# Patient Record
Sex: Female | Born: 1970 | Race: White | Hispanic: No | Smoking: Never smoker
Health system: Southern US, Community
[De-identification: ages and names within clinical notes are randomized; demographics above are authoritative.]

## PROBLEM LIST (undated history)

## (undated) DIAGNOSIS — Z872 Personal history of diseases of the skin and subcutaneous tissue: Secondary | ICD-10-CM

## (undated) DIAGNOSIS — F419 Anxiety disorder, unspecified: Secondary | ICD-10-CM

## (undated) DIAGNOSIS — F32A Depression, unspecified: Secondary | ICD-10-CM

## (undated) DIAGNOSIS — I10 Essential (primary) hypertension: Secondary | ICD-10-CM

## (undated) DIAGNOSIS — B009 Herpesviral infection, unspecified: Secondary | ICD-10-CM

## (undated) DIAGNOSIS — Z975 Presence of (intrauterine) contraceptive device: Secondary | ICD-10-CM

## (undated) DIAGNOSIS — F329 Major depressive disorder, single episode, unspecified: Secondary | ICD-10-CM

## (undated) DIAGNOSIS — E7800 Pure hypercholesterolemia, unspecified: Secondary | ICD-10-CM

## (undated) DIAGNOSIS — F429 Obsessive-compulsive disorder, unspecified: Secondary | ICD-10-CM

## (undated) DIAGNOSIS — R011 Cardiac murmur, unspecified: Secondary | ICD-10-CM

## (undated) DIAGNOSIS — Z87898 Personal history of other specified conditions: Secondary | ICD-10-CM

## (undated) HISTORY — DX: Personal history of other specified conditions: Z87.898

## (undated) HISTORY — DX: Obsessive-compulsive disorder, unspecified: F42.9

## (undated) HISTORY — DX: Presence of (intrauterine) contraceptive device: Z97.5

## (undated) HISTORY — DX: Personal history of diseases of the skin and subcutaneous tissue: Z87.2

## (undated) HISTORY — DX: Depression, unspecified: F32.A

## (undated) HISTORY — DX: Herpesviral infection, unspecified: B00.9

## (undated) HISTORY — DX: Major depressive disorder, single episode, unspecified: F32.9

---

## 2007-01-08 ENCOUNTER — Ambulatory Visit: Payer: Self-pay | Admitting: Family Medicine

## 2007-01-14 ENCOUNTER — Ambulatory Visit: Payer: Self-pay | Admitting: Family Medicine

## 2007-01-23 ENCOUNTER — Encounter: Payer: Self-pay | Admitting: Pediatrics

## 2008-01-09 ENCOUNTER — Emergency Department: Payer: Self-pay | Admitting: Emergency Medicine

## 2008-05-22 ENCOUNTER — Emergency Department: Payer: Self-pay | Admitting: Emergency Medicine

## 2008-10-15 DIAGNOSIS — Z87898 Personal history of other specified conditions: Secondary | ICD-10-CM

## 2008-10-15 HISTORY — PX: COLONOSCOPY: SHX174

## 2008-10-15 HISTORY — PX: MM DUCTOGRAM BILAT R/S: HXRAD463

## 2008-10-15 HISTORY — DX: Personal history of other specified conditions: Z87.898

## 2009-02-16 ENCOUNTER — Ambulatory Visit: Payer: Self-pay

## 2009-10-15 HISTORY — PX: BREAST SURGERY: SHX581

## 2009-10-15 HISTORY — PX: BREAST EXCISIONAL BIOPSY: SUR124

## 2010-04-05 ENCOUNTER — Ambulatory Visit: Payer: Self-pay

## 2010-04-14 HISTORY — PX: BIOPSY BREAST: PRO8

## 2010-04-24 ENCOUNTER — Ambulatory Visit: Payer: Self-pay | Admitting: General Surgery

## 2010-05-04 ENCOUNTER — Ambulatory Visit: Payer: Self-pay | Admitting: General Surgery

## 2010-10-15 HISTORY — PX: COSMETIC SURGERY: SHX468

## 2010-10-15 HISTORY — PX: LIPOSUCTION: SHX10

## 2010-10-15 HISTORY — PX: HERNIA REPAIR: SHX51

## 2011-06-14 ENCOUNTER — Ambulatory Visit: Payer: Self-pay | Admitting: Nephrology

## 2011-07-23 ENCOUNTER — Ambulatory Visit: Payer: Self-pay | Admitting: Nephrology

## 2011-09-19 ENCOUNTER — Emergency Department: Payer: Self-pay

## 2011-10-11 ENCOUNTER — Ambulatory Visit: Payer: Self-pay | Admitting: Family Medicine

## 2011-10-11 ENCOUNTER — Ambulatory Visit: Payer: Self-pay | Admitting: Neurology

## 2011-10-17 ENCOUNTER — Ambulatory Visit: Payer: Medicaid Other | Admitting: Rehabilitative and Restorative Service Providers"

## 2011-10-24 ENCOUNTER — Ambulatory Visit: Payer: Medicaid Other | Attending: Neurology | Admitting: Physical Therapy

## 2011-11-02 ENCOUNTER — Other Ambulatory Visit: Payer: Self-pay | Admitting: Neurology

## 2011-11-02 DIAGNOSIS — S139XXA Sprain of joints and ligaments of unspecified parts of neck, initial encounter: Secondary | ICD-10-CM

## 2011-11-02 DIAGNOSIS — IMO0002 Reserved for concepts with insufficient information to code with codable children: Secondary | ICD-10-CM

## 2011-11-02 DIAGNOSIS — S161XXA Strain of muscle, fascia and tendon at neck level, initial encounter: Secondary | ICD-10-CM

## 2011-11-07 ENCOUNTER — Other Ambulatory Visit: Payer: Medicaid Other

## 2011-11-09 ENCOUNTER — Other Ambulatory Visit: Payer: Medicaid Other

## 2011-11-30 ENCOUNTER — Encounter: Payer: Self-pay | Admitting: Neurology

## 2012-04-12 ENCOUNTER — Inpatient Hospital Stay: Payer: Self-pay | Admitting: Internal Medicine

## 2012-04-12 LAB — URINALYSIS, COMPLETE
Blood: NEGATIVE
Ketone: NEGATIVE
Nitrite: NEGATIVE
Ph: 6 (ref 4.5–8.0)
RBC,UR: 2 /HPF (ref 0–5)
Specific Gravity: 1.009 (ref 1.003–1.030)
Squamous Epithelial: 24
WBC UR: 9 /HPF (ref 0–5)

## 2012-04-12 LAB — COMPREHENSIVE METABOLIC PANEL
Alkaline Phosphatase: 72 U/L (ref 50–136)
Anion Gap: 9 (ref 7–16)
Calcium, Total: 8.5 mg/dL (ref 8.5–10.1)
Co2: 26 mmol/L (ref 21–32)
Creatinine: 0.9 mg/dL (ref 0.60–1.30)
SGOT(AST): 46 U/L — ABNORMAL HIGH (ref 15–37)
Sodium: 138 mmol/L (ref 136–145)

## 2012-04-12 LAB — CBC
HCT: 42.5 % (ref 35.0–47.0)
MCH: 29.5 pg (ref 26.0–34.0)
MCHC: 32.9 g/dL (ref 32.0–36.0)
Platelet: 244 10*3/uL (ref 150–440)
RBC: 4.75 10*6/uL (ref 3.80–5.20)
WBC: 13.8 10*3/uL — ABNORMAL HIGH (ref 3.6–11.0)

## 2012-04-12 LAB — PROTIME-INR
INR: 1.4
Prothrombin Time: 17.8 secs — ABNORMAL HIGH (ref 11.5–14.7)

## 2012-04-13 LAB — CBC WITH DIFFERENTIAL/PLATELET
Basophil %: 0.7 %
HCT: 36.1 % (ref 35.0–47.0)
HGB: 12.4 g/dL (ref 12.0–16.0)
Lymphocyte #: 2.2 10*3/uL (ref 1.0–3.6)
MCHC: 34.4 g/dL (ref 32.0–36.0)
Monocyte #: 0.7 x10 3/mm (ref 0.2–0.9)
Neutrophil #: 5.4 10*3/uL (ref 1.4–6.5)
Platelet: 212 10*3/uL (ref 150–440)
RBC: 4.05 10*6/uL (ref 3.80–5.20)
RDW: 13.6 % (ref 11.5–14.5)

## 2012-04-13 LAB — BASIC METABOLIC PANEL
BUN: 7 mg/dL (ref 7–18)
Calcium, Total: 7.7 mg/dL — ABNORMAL LOW (ref 8.5–10.1)
Co2: 22 mmol/L (ref 21–32)
Creatinine: 0.74 mg/dL (ref 0.60–1.30)
EGFR (African American): >60
EGFR (Non-African Amer.): >60
Glucose: 99 mg/dL (ref 65–99)
Potassium: 3.3 mmol/L — ABNORMAL LOW (ref 3.5–5.1)
Sodium: 143 mmol/L (ref 136–145)

## 2012-11-13 ENCOUNTER — Ambulatory Visit: Payer: Self-pay | Admitting: Family Medicine

## 2013-03-21 ENCOUNTER — Inpatient Hospital Stay: Payer: Self-pay | Admitting: Student

## 2013-03-21 LAB — COMPREHENSIVE METABOLIC PANEL
Albumin: 3.4 g/dL (ref 3.4–5.0)
Alkaline Phosphatase: 63 U/L (ref 50–136)
BUN: 7 mg/dL (ref 7–18)
Calcium, Total: 8.2 mg/dL — ABNORMAL LOW (ref 8.5–10.1)
Co2: 24 mmol/L (ref 21–32)
EGFR (Non-African Amer.): >60
Glucose: 141 mg/dL — ABNORMAL HIGH (ref 65–99)
Total Protein: 7.1 g/dL (ref 6.4–8.2)

## 2013-03-21 LAB — CBC
HCT: 39 % (ref 35.0–47.0)
HGB: 13.3 g/dL (ref 12.0–16.0)
MCH: 30.1 pg (ref 26.0–34.0)
MCV: 89 fL (ref 80–100)
Platelet: 208 10*3/uL (ref 150–440)
RBC: 4.41 10*6/uL (ref 3.80–5.20)
RDW: 13.3 % (ref 11.5–14.5)

## 2013-03-22 LAB — BASIC METABOLIC PANEL
Anion Gap: 8 (ref 7–16)
Calcium, Total: 7.6 mg/dL — ABNORMAL LOW (ref 8.5–10.1)
Co2: 20 mmol/L — ABNORMAL LOW (ref 21–32)
Osmolality: 266 (ref 275–301)
Potassium: 4.2 mmol/L (ref 3.5–5.1)
Sodium: 134 mmol/L — ABNORMAL LOW (ref 136–145)

## 2013-03-22 LAB — CBC WITH DIFFERENTIAL/PLATELET
Basophil #: 0 10*3/uL (ref 0.0–0.1)
Eosinophil #: 0 10*3/uL (ref 0.0–0.7)
HCT: 39.1 % (ref 35.0–47.0)
HGB: 13.1 g/dL (ref 12.0–16.0)
Lymphocyte #: 0.5 10*3/uL — ABNORMAL LOW (ref 1.0–3.6)
MCH: 30 pg (ref 26.0–34.0)
MCHC: 33.5 g/dL (ref 32.0–36.0)
MCV: 90 fL (ref 80–100)
Monocyte #: 0.3 x10 3/mm (ref 0.2–0.9)
Monocyte %: 2.2 %
Neutrophil #: 13.8 10*3/uL — ABNORMAL HIGH (ref 1.4–6.5)
Neutrophil %: 94.5 %
Platelet: 169 10*3/uL (ref 150–440)
RBC: 4.37 10*6/uL (ref 3.80–5.20)
RDW: 13.4 % (ref 11.5–14.5)
WBC: 14.6 10*3/uL — ABNORMAL HIGH (ref 3.6–11.0)

## 2013-03-23 LAB — CBC WITH DIFFERENTIAL/PLATELET
Basophil %: 1.9 %
Eosinophil #: 0 10*3/uL (ref 0.0–0.7)
Eosinophil %: 0 %
HCT: 40.2 % (ref 35.0–47.0)
HGB: 13.3 g/dL (ref 12.0–16.0)
Lymphocyte #: 1.3 10*3/uL (ref 1.0–3.6)
MCH: 29.6 pg (ref 26.0–34.0)
MCHC: 33.1 g/dL (ref 32.0–36.0)
Monocyte #: 0.5 x10 3/mm (ref 0.2–0.9)
Monocyte %: 4.2 %
Neutrophil #: 10.3 10*3/uL — ABNORMAL HIGH (ref 1.4–6.5)
WBC: 12.3 10*3/uL — ABNORMAL HIGH (ref 3.6–11.0)

## 2013-03-23 LAB — BASIC METABOLIC PANEL
Anion Gap: 8 (ref 7–16)
Calcium, Total: 7.2 mg/dL — ABNORMAL LOW (ref 8.5–10.1)
EGFR (Non-African Amer.): >60
Glucose: 82 mg/dL (ref 65–99)

## 2013-03-24 LAB — CBC WITH DIFFERENTIAL/PLATELET
Basophil #: 0.1 10*3/uL (ref 0.0–0.1)
Eosinophil #: 0.2 10*3/uL (ref 0.0–0.7)
HCT: 35.4 % (ref 35.0–47.0)
HGB: 12.2 g/dL (ref 12.0–16.0)
Lymphocyte #: 1 10*3/uL (ref 1.0–3.6)
Lymphocyte %: 10.2 %
MCHC: 34.4 g/dL (ref 32.0–36.0)
MCV: 87 fL (ref 80–100)
Neutrophil #: 8.3 10*3/uL — ABNORMAL HIGH (ref 1.4–6.5)
Neutrophil %: 82.2 %
RBC: 4.06 10*6/uL (ref 3.80–5.20)
RDW: 13.7 % (ref 11.5–14.5)
WBC: 10.1 10*3/uL (ref 3.6–11.0)

## 2013-03-27 LAB — CULTURE, BLOOD (SINGLE)

## 2014-06-18 ENCOUNTER — Emergency Department: Payer: Self-pay | Admitting: Emergency Medicine

## 2014-06-18 LAB — CBC WITH DIFFERENTIAL/PLATELET
Basophil #: 0.1 10*3/uL (ref 0.0–0.1)
Basophil %: 0.7 %
Eosinophil #: 0.2 10*3/uL (ref 0.0–0.7)
Eosinophil %: 2.8 %
HCT: 45.3 % (ref 35.0–47.0)
HGB: 14.9 g/dL (ref 12.0–16.0)
LYMPHS PCT: 15.5 %
Lymphocyte #: 1.1 10*3/uL (ref 1.0–3.6)
MCH: 30.7 pg (ref 26.0–34.0)
MCHC: 33 g/dL (ref 32.0–36.0)
MCV: 93 fL (ref 80–100)
MONO ABS: 0.7 x10 3/mm (ref 0.2–0.9)
MONOS PCT: 9.2 %
Neutrophil #: 5.1 10*3/uL (ref 1.4–6.5)
Neutrophil %: 71.8 %
PLATELETS: 246 10*3/uL (ref 150–440)
RBC: 4.87 10*6/uL (ref 3.80–5.20)
RDW: 12.9 % (ref 11.5–14.5)
WBC: 7.1 10*3/uL (ref 3.6–11.0)

## 2014-06-18 LAB — BASIC METABOLIC PANEL
ANION GAP: 7 (ref 7–16)
BUN: 6 mg/dL — AB (ref 7–18)
CALCIUM: 8.9 mg/dL (ref 8.5–10.1)
CHLORIDE: 103 mmol/L (ref 98–107)
CO2: 26 mmol/L (ref 21–32)
Creatinine: 1.16 mg/dL (ref 0.60–1.30)
EGFR (African American): >60
EGFR (Non-African Amer.): 58 — ABNORMAL LOW
Glucose: 111 mg/dL — ABNORMAL HIGH (ref 65–99)
Osmolality: 270 (ref 275–301)
Potassium: 3.9 mmol/L (ref 3.5–5.1)
Sodium: 136 mmol/L (ref 136–145)

## 2014-06-18 LAB — MONONUCLEOSIS SCREEN: Mono Test: NEGATIVE

## 2014-06-30 ENCOUNTER — Ambulatory Visit: Payer: Self-pay | Admitting: Family Medicine

## 2014-09-05 ENCOUNTER — Emergency Department: Payer: Self-pay | Admitting: Internal Medicine

## 2015-02-04 NOTE — H&P (Signed)
PATIENT NAME:  Victoria Shepard, Victoria Shepard MR#:  [id number] DATE OF BIRTH:  09-Oct-1971  DATE OF ADMISSION:  03/21/2013  REFERRING PHYSICIAN: Dr. Reita Cliche.  PRIMARY CARE PHYSICIAN: Dr. Jeananne Rama.   CHIEF COMPLAINT: Abdominal pain, redness and fevers.   HISTORY OF PRESENT ILLNESS: The patient is a pleasant, 44 year old, obese, Caucasian female with a history of liposuction, tummy tuck and cellulitis last year, anxiety, hypertension and depression, who presents to the hospital after experiencing the above symptoms. The patient states that she has been having lipotropic injections 2 days a week, 2 injections in her upper abdomen. The dose was recently increased and she was also given a rubber belt to apply. These are all for her presumed obesity, which is being treated at [facility]. After the lipotropic injection yesterday, she applied the rubber band for the first time. She started to have redness in her belly with pain and then developed fevers. She stated that she felt hot yesterday, but did not check her temp, but today it was 101.5. She came in to the hospital. She was found to have tachycardia, leukocytosis, redness in cellulitis and the abdomen, and hospitalist services were contacted for further evaluation and management.   PAST MEDICAL HISTORY: History of anxiety, depression, abdominal liposuction, hypertension, history of heart murmur, history of hemorrhoids and a history of abdominal cellulitis/contact dermatitis possibly last year.   SOCIAL HISTORY: No Tobacco, alcohol, or other drug use.   OUTPATIENT MEDICATIONS: Atenolol 25 mg daily, Celexa 40 mg daily, Klonopin 1 mg 2 times a day as needed for nervousness, nortriptyline 30 mg once a day at bedtime and phentermine 37.5 mg once a day.   ALLERGIES: AMOXICILLIN, ERYTHROMYCIN AND PENICILLIN.   FAMILY HISTORY: Hypertension and diabetes. Father with MI.   REVIEW OF SYSTEMS: CONSTITUTIONAL: Positive for fever, fatigue and global weakness.  EYES: No blurry  vision or double vision. ENT: No tinnitus or hearing loss. No dysphagia.  CARDIOVASCULAR: No chest pain, swelling in the legs or orthopnea. He has high blood pressure. PULMONARY: Denies any a cough or shortness of breath, wheezing or COPD. GASTROINTESTINAL: Nausea without vomiting. Positive for abdominal wall redness and soreness. No melena, bloody stools or dark stools. GENITOURINARY: Denies dysuria or hematuria. HEMATOLOGIC AND LYMPHATIC: No anemia or easy bruising. SKIN: Rash as above, otherwise no other rashes. MUSCULOSKELETAL: Has aches since yesterday. NEUROLOGIC: Denies focal weakness or numbness. No stroke or seizures. PSYCHIATRIC: Has anxiety and depression.   PHYSICAL EXAMINATION:  VITAL SIGNS: Temperature was 99.4, pulse rate 110, respiratory rate 20, blood pressure 121/68, oxygen saturation 97% on room air.  GENERAL: The patient is an obese, Caucasian female sitting in bed in no obvious distress.  HEENT: Normocephalic, atraumatic. Pupils are equal and reactive. Anicteric sclerae. Extraocular muscles intact. Moist mucous membranes.  NECK: Supple. No thyroid tenderness. No cervical lymphadenopathy.  CARDIOVASCULAR: S1, S2, tachycardic. No murmurs, rubs, or gallops.  LUNGS: Clear to auscultation. No wheezing, rhonchi or rales.  ABDOMEN: Soft and distended. There is a band-like, below umbilical, area of redness, tenderness and heat that tracks laterally on flanks. There is no drainage.  EXTREMITIES: No significant lower extremity edema.  SKIN: No obvious rashes other than stated above.  NEUROLOGIC: Cranial nerves II through XII grossly intact. Strength is 5 out of 5 in all extremities. Sensation intact to light touch.  PSYCHIATRIC: Awake, alert, oriented x 3. Cooperative.   LABS: Glucose 141, BUN 7, creatinine 1.07, sodium 136, potassium 3.3. LFTs within normal limits. WBC is 18.2, hemoglobin 13.3, platelets  208.   ASSESSMENT AND PLAN: We have a 44 year old female with history of  liposuction and tummy tuck, hypertension, anxiety, depression, who is getting treated at [facility] with "lipotropic" injections, 4 injections weekly, who also started to use a rubber belt, who comes in with sepsis. The patient has never used rubber belts before she states. She has tachycardia, leukocytosis and evidence for abdominal wall cellulitis, which oddly enough is in a band-like format with possible exacerbation from the rubber band. There is also possible contact dermatitis from the rubber band. However, the patient has significant leukocytosis as well. We will admit the patient to the hospital. Would start the patient on clindamycin as she can tolerate this and she did take this last year with improvement. Would start her on IV fluids. Blood cultures have been stent. She was started on Tylenol. We would also start her on some Benadryl for itching. Follow with the cultures and hopefully the rash would resolve. The patient does appear to have a small skin tear on the lateral side of the umbilicus, but this appears not to be the source. The patient has also been having injections of lipotropic material in her abdomen and she showed me where she injected the material; however, I do not see any redness, cellulitis or drainage at those sites which are higher than the affected cellulitic portion of the abdomen, which makes me think that it is possibly not the etiology and perhaps the rubber band irritated the skin causing a super infection over the irritated area. She does have some hypokalemia and this will be repleted. I will start her on some heparin for deep vein thrombosis prophylaxis. Resume her blood pressure and anxiety  medications.   TOTAL TIME SPENT: 55 minutes.   CODE STATUS: The patient is full code.   ____________________________ Vivien Presto, MD sa:aw D: 03/21/2013 12:51:46 ET T: 03/21/2013 13:22:23 ET JOB#: [id number]  cc: Vivien Presto, MD, <Dictator> Guadalupe Maple, MD Vivien Presto MD ELECTRONICALLY SIGNED 03/21/2013 14:27

## 2015-02-04 NOTE — Discharge Summary (Signed)
PATIENT NAME:  Victoria Shepard, FEI MR#:  [id number] DATE OF BIRTH:  [birth year]  DATE OF ADMISSION:  03/21/2013 DATE OF DISCHARGE:  03/24/2013  PRIMARY CARE PHYSICIAN: Guadalupe Maple, MD  CHIEF COMPLAINT: Abdominal pain, redness, fevers.   DISCHARGE DIAGNOSES:  1. Sepsis from abdominal wall cellulitis.  2. Transient hypotension.  3. History of hypertension.  4. Anxiety.  5. Depression.  6. Hypokalemia.  7. History of abdominal liposuction.  8. History of hemorrhoids.  9. History of abdominal cellulitis and contact dermatitis last year.   DISCHARGE MEDICATIONS:  1. Celexa 40 mg daily. 2. Klonopin 1 mg 2 times a day as needed for anxiety or nervousness.  3. Nortriptyline 30 mg daily.  4. Phentermine 37.5 mg daily.  5. Clindamycin 300 mg 1 cap every 6 hours.  6. Zofran ODT 4 mg 3 times a day as needed for nausea and vomiting.   SIGNIFICANT LABORATORY AND IMAGING DATA: Initial WBC was 18.1; discharge day WBC was 10.1. Initial hemoglobin 13.3. LFTs on arrival within normal limits. Initial potassium 3.3, creatinine 1.07, BUN 7, creatinine 1.07. Blood cultures have been no growth to date from June 7. A lactic acid was 1.8 on June 8.   HISTORY OF PRESENT ILLNESS AND HOSPITAL COURSE: For full details of H and P, please see the dictation on June 7 by Dr. Bridgette Habermann, but briefly, this is a pleasant 44 year old obese female with a history of liposuction, tummy tack and cellulitis last year of abdominal wall, who presents with the above chief complaint. The patient had been having lipotropic injections 2 days a week in her upper abdomen prescribed by her [facility], and started to wear a rubber band around her stomach. On the day of admission, started to sweat profusely, started to have a temperature, redness and evidence for infection with leukocytosis. She was admitted to the hospitalist service, started on clindamycin. She had leukocytosis and tachycardia on arrival, possibly from sepsis from abdominal wall.  Blood cultures were obtained, which have been negative to date. The patient did become transiently hypotensive, requiring multiple boluses of IV fluids, and her atenolol was held. She was transferred to CCU transiently; however, did not go into shock, nor did she require pressors. The patient was started on daptomycin IV to cover for broad-spectrum, including MRSA; however, did well and was discharged with clindamycin for total treatment days of 10 days. Her sepsis appears to have resolved. There is still erythema and tenderness on the abdominal wall, but that is stable and not worsening. At this point, she has no fever and will be discharged with outpatient followup with Dr. Golden Pop and instructed to no longer have any injections. Furthermore, the patient likely has a contact dermatitis element from the rubber band and was instructed to not use that belt.   TOTAL TIME SPENT: 35 minutes.  CODE STATUS: The patient is full code.   ____________________________ Vivien Presto, MD sa:OSi D: 03/25/2013 08:39:34 ET T: 03/25/2013 09:15:47 ET JOB#: [id number]  cc: Vivien Presto, MD, <Dictator> Guadalupe Maple, MD Vivien Presto MD ELECTRONICALLY SIGNED 04/12/2013 22:10

## 2015-02-06 NOTE — Discharge Summary (Signed)
PATIENT NAME:  Victoria Shepard, Victoria Shepard MR#:  [id number] DATE OF BIRTH:  16-Aug-1971  DATE OF ADMISSION:  04/12/2012 DATE OF DISCHARGE:  04/13/2012  PRESENTING COMPLAINT: Rash over the lower abdomen.  DISCHARGE DIAGNOSES: 1. Abdominal wall pruritic rash suspected irritation from tanning spray.  2. Small open surgical wound, chronic over the umbilicus.   CONDITION ON DISCHARGE: Fair.   MEDICATIONS:  1. Celexa 40 mg p.o. daily.  2. Klonopin 1 mg 1 tablet twice a day as needed.  3. Atenolol 25 mg daily.  4. Nortriptyline 10 mg tablet 30 mg daily at bedtime.  5. Clindamycin 300 mg p.o. t.i.d. for six days.  6. Benadryl 25 mg p.o. b.i.d. p.r.n. over-the-counter for itching.   FOLLOW UP: Follow up with Dr. Jeananne Rama in 1 to 2 weeks.   LABORATORY, DIAGNOSTIC AND RADIOLOGICAL DATA: Blood cultures negative. White count 8.6. Rest of the comprehensive metabolic panel within normal limits except potassium of 3.3. PT-INR 17.8 and 1.4. Urinalysis negative for urinary tract infection. Wound aerobic culture done from the umbilical area shows heavy growth of staph aureus. Blood culture negative in 48 hours. White count 13.8 on admission. ESR 13.    HISTORY OF PRESENT ILLNESS: Victoria Shepard is a 44 year old Caucasian female who came into the Emergency Room after she started noticing itchy rash in the lower abdomen. She was admitted with:  1. Lower abdominal rash, initially thought to be cellulitis, however, it appeared to be more of a rash which was likely due to use from tanning spray with skin irritation. She had been using the tanning spray and continued with some sinus congestion along with this itchy rash and headache. Blood cultures remained negative. White count was normal. She was started on IV clindamycin. Did not seem to be any evidence of true cellulitis on examination. Patient was advised not to use the spray. She will finish up a course of clindamycin.   2. Small skin ulcer near the umbilicus from previous  surgical scar. She is on empiric clindamycin. Her wound culture grew staph aureus. She has had staph infection from the same ulcer in the past. Patient did not appear clinically toxic.  3. Anxiety. She was continued on her home medications.  4. Patient was clinically stable at discharge to home. Follow up with her primary care physician, Dr. Jeananne Rama, in 1 to 2 weeks.   TIME SPENT: 40 minutes.   ____________________________ Hart Rochester Posey Pronto, MD sap:cms D: 04/14/2012 14:13:22 ET T: 04/15/2012 11:06:02 ET JOB#: [id number]  cc: Neamiah Sciarra A. Posey Pronto, MD, <Dictator> Guadalupe Maple, MD Ilda Basset MD ELECTRONICALLY SIGNED 04/17/2012 14:10

## 2015-02-06 NOTE — H&P (Signed)
PATIENT NAME:  Victoria Shepard, CIPRIANI MR#:  [id number] DATE OF BIRTH:  1971/08/01  DATE OF ADMISSION:  04/12/2012  PRIMARY CARE PHYSICIAN: Golden Pop, MD  REFERRING PHYSICIAN: Ferman Hamming, MD  CHIEF COMPLAINT: Abdominal erythema, sore, and fever for two days.  HISTORY OF PRESENT ILLNESS: The patient is a 44 year old Caucasian female with a history of hypertension, anxiety, and abdominal liposuction surgery in 2010 and Staphylococcus infection status post surgery who presented to the emergency department with abdominal redness, sore, and fever for the past two days. The patient's highest temperature was 102 yesterday. In addition, she had a headache, nausea, generalized body soreness, and palpitations. She denies any chest pain or diaphoresis, no leg swelling, orthopnea or nocturnal dyspnea, no melena or bloody stool, no diarrhea, no dysuria, and no hematuria.  She was treated with vancomycin in the emergency department and started to feel itchy.   SOCIAL HISTORY: Denies any smoking, alcohol or illicit drugs.   FAMILY HISTORY: Hypertension and diabetes. Father had a heart attack.   PAST SURGICAL HISTORY: Abdominal liposuction surgery in 2010.  REVIEW OF SYSTEMS: CONSTITUTIONAL: The patient has fever and chills and headache, but no dizziness, weakness. HEENT: No double vision or blurred vision and no epistaxis or postnasal drip. No dysphagia.  CARDIOVASCULAR: No chest pain, leg swelling, orthopnea, or nocturnal dyspnea but has palpitations.  PULMONARY: No cough, sputum, shortness of breath, or hemoptysis. GASTROINTESTINAL: Positive for nausea and abdominal soreness. No vomiting or diarrhea, no melena or bloody stool. GENITOURINARY: No dysuria, hematuria or incontinence. SKIN: No jaundice but has redness in the abdominal area. NEURO: No syncope, loss of consciousness or seizures. PSYCH: Has anxiety but no depression.      PHYSICAL EXAMINATION:  VITALS: Temperature 99.8, blood pressure 126/95,  pulse 145, respirations 20, and oxygen saturation 98% in room air.  PHYSICAL EXAMINATION: Alert, awake, and oriented; in no acute distress.  HEENT: Pupils are round, equal and reactive to light and accommodation. Moist oral mucosa. Clear oropharynx.   NECK: Supple. No JVD or carotid bruits. No lymphadenopathy or thyromegaly.  HEART: S1 and S2 regular rate and rhythm. No murmurs, rubs, or gallops.   LUNGS: Bilateral air entry. No wheezing or rales. No use of accessory muscles to breathe.  ABDOMEN: Obese, soft. Bowel sounds present. No obvious organomegaly. No tenderness. No distention. There is erythema around lower part of abdomen.  There is a surgical scar on both sides of the lower abdomen with erythema and mild tenderness but no obvious abscess. Has warmness.   EXTREMITIES: No edema, clubbing or cyanosis. No calf tenderness.   NEURO: Alert and oriented x3. No focal deficits. Power 5/5. Sensation intact. DTRs 2+.  LABORATORY, DIAGNOSTIC AND RADIOLOGIC DATA: Urinalysis: WBC 9, RBC 2, nitrite negative.   Glucose 111, BUN 7, creatinine 0.9, sodium 138, potassium 3.3, chloride 103, and bicarbonate 26. WBC 13.8, hemoglobin 14, and platelets 244. ESR 13.   EKG: Sinus tachy at 133 beats per minute.   IMPRESSION:  1. Abdominal cellulitis. 2. SIRS.  3. Tachycardia. 4. Hypokalemia.  5. Hypertension, controlled. 6. Anxiety.  PLAN OF TREATMENT: The patient will be admitted to the medical floor. Due to allergic reaction, we will start clindamycin 300 mg IV every eight hours. Followup CBC and we will give IV fluid support. For hypokalemia, we will give Klor-Con and followup BMP and magnesium level. For hypertension, we will continue Lopressor. For anxiety, we will continue Celexa and Klonopin p.r.n. GI and DVT prophylaxis.   I discussed the patient's situation  with the patient and the patient's daughter.  TIME SPENT: Approximately 55 minutes. ____________________________ Demetrios Loll,  MD qc:slb D: 04/12/2012 13:02:18 ET T: 04/12/2012 13:32:52 ET JOB#: [id number]  cc: Demetrios Loll, MD, <Dictator> Guadalupe Maple, MD Demetrios Loll MD ELECTRONICALLY SIGNED 04/13/2012 15:35

## 2015-02-24 ENCOUNTER — Emergency Department
Admission: EM | Admit: 2015-02-24 | Discharge: 2015-02-24 | Disposition: A | Discharge status: Home / Self Care | Payer: Medicaid Other | Attending: Emergency Medicine | Admitting: Emergency Medicine

## 2015-02-24 ENCOUNTER — Encounter: Payer: Self-pay | Admitting: *Deleted

## 2015-02-24 DIAGNOSIS — K0889 Other specified disorders of teeth and supporting structures: Secondary | ICD-10-CM

## 2015-02-24 DIAGNOSIS — Z880 Allergy status to penicillin: Secondary | ICD-10-CM | POA: Diagnosis not present

## 2015-02-24 DIAGNOSIS — K088 Other specified disorders of teeth and supporting structures: Secondary | ICD-10-CM | POA: Diagnosis not present

## 2015-02-24 HISTORY — DX: Pure hypercholesterolemia, unspecified: E78.00

## 2015-02-24 HISTORY — DX: Anxiety disorder, unspecified: F41.9

## 2015-02-24 HISTORY — DX: Cardiac murmur, unspecified: R01.1

## 2015-02-24 HISTORY — DX: Essential (primary) hypertension: I10

## 2015-02-24 MED ORDER — TRAMADOL HCL 50 MG PO TABS: 50.0000 mg | ORAL_TABLET | Freq: Four times a day (QID) | ORAL | Status: DC | PRN

## 2015-02-24 MED ORDER — IBUPROFEN 50 MG PO CHEW: 50.0000 mg | CHEWABLE_TABLET | Freq: Three times a day (TID) | ORAL | Status: DC | PRN

## 2015-02-24 MED ORDER — CEPHALEXIN 500 MG PO CAPS: 500.0000 mg | ORAL_CAPSULE | Freq: Four times a day (QID) | ORAL | Status: AC

## 2015-02-24 NOTE — ED Notes (Signed)
Left ear pain , left jaw pain cavity noted , lost filling ( unknown time she lost it ) sensitive

## 2015-02-24 NOTE — ED Notes (Signed)
Pt complains of left ear pain for the last two days

## 2015-02-24 NOTE — Discharge Instructions (Signed)
Dental Pain A tooth ache may be caused by cavities (tooth decay). Cavities expose the nerve of the tooth to air and hot or cold temperatures. It may come from an infection or abscess (also called a boil or furuncle) around your tooth. It is also often caused by dental caries (tooth decay). This causes the pain you are having. DIAGNOSIS  Your caregiver can diagnose this problem by exam. TREATMENT   If caused by an infection, it may be treated with medications which kill germs (antibiotics) and pain medications as prescribed by your caregiver. Take medications as directed.  Only take over-the-counter or prescription medicines for pain, discomfort, or fever as directed by your caregiver.  Whether the tooth ache today is caused by infection or dental disease, you should see your dentist as soon as possible for further care. SEEK MEDICAL CARE IF: The exam and treatment you received today has been provided on an emergency basis only. This is not a substitute for complete medical or dental care. If your problem worsens or new problems (symptoms) appear, and you are unable to meet with your dentist, call or return to this location. SEEK IMMEDIATE MEDICAL CARE IF:   You have a fever.  You develop redness and swelling of your face, jaw, or neck.  You are unable to open your mouth.  You have severe pain uncontrolled by pain medicine. MAKE SURE YOU:   Understand these instructions.  Will watch your condition.  Will get help right away if you are not doing well or get worse. Document Released: 10/01/2005 Document Revised: 12/24/2011 Document Reviewed: 05/19/2008 [facility] Patient Information 2015 [place], [place]. This information is not intended to replace advice given to you by your health care provider. Make sure you discuss any questions you have with your health care provider. OPTIONS FOR DENTAL FOLLOW UP CARE  [place] [facility] and [place] [email or web address] [phone])  [facility] [phone])  [place] [phone])  [place] [phone])  [facility] [phone]) This clinic caters to the indigent population and is on a lottery system. Location: [employer] [facility], [place], [place], [place] Clinic Hours: Wednesdays from 6pm - 9pm, patients seen by a lottery system. For dates, call or go to [email or web address] Services: Cleanings, fillings and simple extractions. Payment Options: DENTAL WORK IS FREE OF CHARGE. Bring proof of income or support. Best way to get seen: Arrive at 5:15 pm - this is a lottery, NOT first come/first serve, so arriving earlier will not increase your chances of being seen.     [facility] [phone] Select option 1 for emergencies   Location: [facility], [place], [address] [place] Clinic Hours: No walk-ins accepted - call the day before to schedule an appointment. Check in times are 9:30 am and 1:30 pm. Services: Simple extractions, temporary fillings, pulpectomy/pulp debridement, uncomplicated abscess drainage. Payment Options: PAYMENT IS DUE AT THE TIME OF SERVICE.  Fee is usually $100-200, additional surgical procedures (e.g. abscess drainage) may be extra. Cash, checks, Visa/MasterCard accepted.  Can file Medicaid if patient is covered for dental - patient should call case worker to check. No discount for [facility] patients. Best way to get seen: MUST call the day before and get onto the schedule. Can usually be seen the next 1-2 days. No walk-ins accepted.     [facility] [phone]   Location: [place], [place] Clinic Hours: M, W, Th, F 8am or 1:30pm, Tues  9a or 1:30 - first come/first served. Services: Simple extractions, temporary fillings, uncomplicated abscess drainage.  You do not need to be an [facility] resident. Payment Options: PAYMENT IS DUE AT THE TIME OF SERVICE. Dental insurance, otherwise sliding scale - bring proof of income or support. Depending on income and treatment needed, cost is usually $50-200. Best way to get seen: Arrive early as it is first come/first served.     [facility] [phone]   Location: [place] [facility] Hours: Mon-Thu 8a-5p Services: Most basic dental services including extractions and fillings. Payment Options: PAYMENT IS DUE AT THE TIME OF SERVICE. Sliding scale, up to 50% off - bring proof if income or support. Medicaid with dental option accepted. Best way to get seen: Call to schedule an appointment, can usually be seen within 2 weeks OR they will try to see walk-ins - show up at [place] or 2p (you may have to wait).     [facility] [place] RESIDENTS ONLY   Location: [facility], [place] [address], [place], [place] [address] Clinic Hours: By appointment only. Monday - Thursday 8am-5pm, Friday 8am-12pm Services: Cleanings, fillings, extractions. Payment Options: PAYMENT IS DUE AT THE TIME OF SERVICE. Cash, Visa or MasterCard. Sliding scale - $30 minimum per service. Best way to get seen: Come in to office, complete packet and make an appointment - need proof of income or support monies for each household member and proof of [facility] residence. Usually takes about a month to get in.     [facility] [phone]   Location: [address] [place] Clinic Hours: Walk-in Urgent Care Dental Services are offered Monday-Friday mornings only. The numbers of emergencies accepted daily is limited to the number of providers available. Maximum 15 -  Mondays, Wednesdays & Thursdays Maximum 10 - Tuesdays & Fridays Services: You do not need to be a [facility] resident to be seen for a dental emergency. Emergencies are defined as pain, swelling, abnormal bleeding, or dental trauma. Walkins will receive x-rays if needed. NOTE: Dental cleaning is not an emergency. Payment Options: PAYMENT IS DUE AT THE TIME OF SERVICE. Minimum co-pay is $40.00 for uninsured patients. Minimum co-pay is $3.00 for Medicaid with dental coverage. Dental Insurance is accepted and must be presented at time of visit. Medicare does not cover dental. Forms of payment: Cash, credit card, checks. Best way to get seen: If not previously registered with the clinic, walk-in dental registration begins at 7:15 am and is on a first come/first serve basis. If previously registered with the clinic, call to make an appointment.     [facility] [place] ONLY   Location: [address] [place], [place] Clinic Hours: Mon-Thu 10a-2p Services: Extractions only! Payment Options: FREE (donations accepted) - bring proof of income or support Best way to get seen: Call and schedule an appointment OR come at 8am on the 1st Monday of every month (except for holidays) when it is first come/first served.     [facility] [phone]   Location: [place], [place] Clinic Hours: Friday mornings Services, Payment Options, Best way to get seen: Call for info

## 2015-02-24 NOTE — ED Provider Notes (Signed)
[facility] Emergency Department Provider Note  ____________________________________________  Time seen: Approximately 4:52 PM  I have reviewed the triage vital signs and the nursing notes.   HISTORY  Chief Complaint Otalgia    HPI Victoria Shepard is a 44 y.o. female complaining of dental pain radiating to the left ear. Patient is secondary to a filling is partially broken off from molar. Patient stated intermittent bleeding from the gums. Patient states she is able to eat with only mild discomfort. She denies any fever or chills. She rates the pain as 8/10. Patient said unable to see a dentist secondary to lack of insurance.   No past medical history on file.  There are no active problems to display for this patient.   No past surgical history on file.  No current outpatient prescriptions on file.  Allergies Penicillins  No family history on file.  Social History History  Substance Use Topics  . Smoking status: Never Smoker   . Smokeless tobacco: Not on file  . Alcohol Use: No    Review of Systems Constitutional: No fever/chills Eyes: No visual changes. ENT: No sore throat. Cardiovascular: Denies chest pain. Respiratory: Denies shortness of breath. Gastrointestinal: No abdominal pain.  No nausea, no vomiting.  No diarrhea.  No constipation. Genitourinary: Negative for dysuria. Musculoskeletal: Negative for back pain. Skin: Negative for rash. Neurological: Negative for headaches, focal weakness or numbness.  10-point ROS otherwise negative.  ____________________________________________   PHYSICAL EXAM:  VITAL SIGNS: ED Triage Vitals  Enc Vitals Group     BP 02/24/15 1552 132/78 mmHg     Pulse Rate 02/24/15 1552 111     Resp --      Temp 02/24/15 1552 98.4 F (36.9 C)     Temp Source 02/24/15 1552 Oral     SpO2 02/24/15 1552 99 %     Weight 02/24/15 1552 168 lb (76.204 kg)     Height 02/24/15 1552 5\' 3"  (1.6 m)   Head Cir --      Peak Flow --      Pain Score 02/24/15 1553 8     Pain Loc --      Pain Edu? --      Excl. in [place]? --     Constitutional: Alert and oriented. Well appearing and in no acute distress. Eyes: Conjunctivae are normal. PERRL. EOMI. Head: Atraumatic. Nose: No congestion/rhinnorhea. Mouth/Throat: Mucous membranes are moist.  Oropharynx non-erythematous. States swollen around the gum on the left lower molar Neck: No stridor.   Hematological/Lymphatic/Immunilogical: No cervical lymphadenopathy. Cardiovascular: Normal rate, regular rhythm. Grossly normal heart sounds.  Good peripheral circulation. Respiratory: Normal respiratory effort.  No retractions. Lungs CTAB. Gastrointestinal: Soft and nontender. No distention. No abdominal bruits. No CVA tenderness. Musculoskeletal: No lower extremity tenderness nor edema.  No joint effusions. Neurologic:  Normal speech and language. No gross focal neurologic deficits are appreciated. Speech is normal. No gait instability. Skin:  Skin is warm, dry and intact. No rash noted. Psychiatric: Mood and affect are normal. Speech and behavior are normal.  ____________________________________________   LABS (all labs ordered are listed, but only abnormal results are displayed)  Labs Reviewed - No data to display ____________________________________________  EKG   ____________________________________________  RADIOLOGY  ___________________________________________   PROCEDURES  Procedure(s) performed: None  Critical Care performed: No  ____________________________________________   INITIAL IMPRESSION / ASSESSMENT AND PLAN / ED COURSE  Pertinent labs & imaging results that were available during my care of the patient were  reviewed by me and considered in my medical decision making (see chart for details).  Dental pain ____________________________________________   FINAL CLINICAL IMPRESSION(S) / ED DIAGNOSES  Final  diagnoses:  Pain, dental    OPTIONS FOR DENTAL FOLLOW UP CARE  [place] [facility] and [place] [email or web address] [phone])  [facility] [phone])  [facility] [phone])  [place] [phone])  [facility] [phone]) This clinic caters to the indigent population and is on a lottery system. Location: [employer] [facility], [place], [place], [place] [facility] Hours: Wednesdays from 6pm - 9pm, patients seen by a lottery system. For dates, call or go to [email or web address] Services: Cleanings, fillings and simple extractions. Payment Options: DENTAL WORK IS FREE OF CHARGE. Bring proof of income or support. Best way to get seen: Arrive at 5:15 pm - this is a lottery, NOT first come/first serve, so arriving earlier will not increase your chances of being seen.     [facility] [phone] Select option 1 for emergencies   Location: [facility], [place], [address] [place] Clinic Hours: No walk-ins accepted - call the day before to schedule an appointment. Check in times are 9:30 am and 1:30 pm. Services: Simple extractions, temporary fillings, pulpectomy/pulp debridement, uncomplicated abscess drainage. Payment Options: PAYMENT IS DUE AT THE TIME OF SERVICE.  Fee is usually $100-200, additional surgical procedures (e.g. abscess drainage) may be extra. Cash, checks, Visa/MasterCard accepted.  Can file Medicaid if patient is covered for dental - patient should call case worker to check. No discount for [facility] patients. Best way to get seen: MUST call the day before and get onto the schedule. Can usually be seen the next 1-2 days. No walk-ins accepted.     [place] [phone]   Location: [place], [place] Clinic Hours: M, W, Th, F 8am or 1:30pm, Tues 9a or 1:30 - first come/first served. Services: Simple extractions, temporary fillings, uncomplicated abscess drainage.  You do not need to be an [facility] resident. Payment Options: PAYMENT IS DUE AT THE TIME OF SERVICE. Dental insurance, otherwise sliding scale - bring proof of income or support. Depending on income and treatment needed, cost is usually $50-200. Best way to get seen: Arrive early as it is first come/first served.     [facility] [phone]   Location: [place] [facility] Hours: Mon-Thu 8a-5p Services: Most basic dental services including extractions and fillings. Payment Options: PAYMENT IS DUE AT THE TIME OF SERVICE. Sliding scale, up to 50% off - bring proof if income or support. Medicaid with dental option accepted. Best way to get seen: Call to schedule an appointment, can usually be seen within 2 weeks OR they will try to see walk-ins - show up at [place] or 2p (you may have to wait).     [facility] RESIDENTS ONLY   Location: [facility], [place] [address] [place], [place] [address] Clinic Hours: By appointment only. Monday - Thursday 8am-5pm, Friday 8am-12pm Services: Cleanings, fillings, extractions. Payment Options: PAYMENT IS DUE AT THE TIME OF SERVICE. Cash, Visa or MasterCard. Sliding scale - $30 minimum per service. Best way to get seen: Come in to office, complete packet and make an appointment - need proof of income or  support monies for each household member and proof of [facility] residence. Usually takes about a month to get in.     [facility] [phone]   Location: [address] [place] Clinic Hours: Walk-in Urgent Care Dental Services are  offered Monday-Friday mornings only. The numbers of emergencies accepted daily is limited to the number of providers available. Maximum 15 - Mondays, Wednesdays & Thursdays Maximum 10 - Tuesdays & Fridays Services: You do not need to be a [facility] resident to be seen for a dental emergency. Emergencies are defined as pain, swelling, abnormal bleeding, or dental trauma. Walkins will receive x-rays if needed. NOTE: Dental cleaning is not an emergency. Payment Options: PAYMENT IS DUE AT THE TIME OF SERVICE. Minimum co-pay is $40.00 for uninsured patients. Minimum co-pay is $3.00 for Medicaid with dental coverage. Dental Insurance is accepted and must be presented at time of visit. Medicare does not cover dental. Forms of payment: Cash, credit card, checks. Best way to get seen: If not previously registered with the clinic, walk-in dental registration begins at 7:15 am and is on a first come/first serve basis. If previously registered with the clinic, call to make an appointment.     [facility] [place] ONLY   Location: [address] [place], [place] Clinic Hours: Mon-Thu 10a-2p Services: Extractions only! Payment Options: FREE (donations accepted) - bring proof of income or support Best way to get seen: Call and schedule an appointment OR come at 8am on the 1st Monday of every month (except for holidays) when it is first come/first served.     [facility] [phone]   Location: [place], [place] Clinic Hours: Friday mornings Services, Payment Options, Best way to get seen: Call for [place], PA-C 02/24/15 [place], PA-C 02/24/15 [place], MD 02/24/15 [phone]

## 2015-03-16 ENCOUNTER — Telehealth: Payer: Self-pay

## 2015-03-16 ENCOUNTER — Other Ambulatory Visit: Payer: Self-pay | Admitting: Family Medicine

## 2015-03-16 NOTE — Telephone Encounter (Signed)
Pt called stated she was seen at [facility] for tooth pain, has an appt to go to the dental clinic later next month. Was prescribed pain meds. The medications she was given are gone. Pt wants to know if [facility] can call in pain and anti inflamatory meds can be called in for her. [facility] [place]. Pt stated her back left tooth broke and her filling came out. -

## 2015-03-16 NOTE — Telephone Encounter (Signed)
call

## 2015-03-16 NOTE — Telephone Encounter (Signed)
Medication was refilled and faxed to pharmacy 03/15/15.

## 2015-04-07 ENCOUNTER — Encounter: Payer: Self-pay | Admitting: Emergency Medicine

## 2015-04-07 ENCOUNTER — Emergency Department
Admission: EM | Admit: 2015-04-07 | Discharge: 2015-04-07 | Disposition: A | Discharge status: Home / Self Care | Payer: Medicaid Other | Attending: Emergency Medicine | Admitting: Emergency Medicine

## 2015-04-07 DIAGNOSIS — Z79899 Other long term (current) drug therapy: Secondary | ICD-10-CM | POA: Insufficient documentation

## 2015-04-07 DIAGNOSIS — I10 Essential (primary) hypertension: Secondary | ICD-10-CM | POA: Insufficient documentation

## 2015-04-07 DIAGNOSIS — Z880 Allergy status to penicillin: Secondary | ICD-10-CM | POA: Diagnosis not present

## 2015-04-07 DIAGNOSIS — B3731 Acute candidiasis of vulva and vagina: Secondary | ICD-10-CM

## 2015-04-07 DIAGNOSIS — B373 Candidiasis of vulva and vagina: Secondary | ICD-10-CM | POA: Insufficient documentation

## 2015-04-07 DIAGNOSIS — L293 Anogenital pruritus, unspecified: Secondary | ICD-10-CM | POA: Diagnosis present

## 2015-04-07 LAB — URINALYSIS COMPLETE WITH MICROSCOPIC (ARMC ONLY)
Bacteria, UA: NONE SEEN
Bilirubin Urine: NEGATIVE
Glucose, UA: NEGATIVE mg/dL
HGB URINE DIPSTICK: NEGATIVE
Ketones, ur: NEGATIVE mg/dL
NITRITE: NEGATIVE
PH: 5 (ref 5.0–8.0)
PROTEIN: NEGATIVE mg/dL
SPECIFIC GRAVITY, URINE: 1.021 (ref 1.005–1.030)

## 2015-04-07 LAB — CHLAMYDIA/NGC RT PCR (ARMC ONLY)
Chlamydia Tr: NOT DETECTED
N gonorrhoeae: NOT DETECTED

## 2015-04-07 LAB — WET PREP, GENITAL
Clue Cells Wet Prep HPF POC: NONE SEEN
Trich, Wet Prep: NONE SEEN
Yeast Wet Prep HPF POC: NONE SEEN

## 2015-04-07 MED ORDER — FLUCONAZOLE 100 MG PO TABS
ORAL_TABLET | ORAL | Status: AC
  Filled 2015-04-07: qty 1

## 2015-04-07 MED ORDER — KETOROLAC TROMETHAMINE 10 MG PO TABS
10.0000 mg | ORAL_TABLET | Freq: Four times a day (QID) | ORAL | Status: DC | PRN
Start: 2015-04-07 — End: 2015-04-29

## 2015-04-07 MED ORDER — FLUCONAZOLE 50 MG PO TABS
150.0000 mg | ORAL_TABLET | Freq: Once | ORAL | Status: AC
  Administered 2015-04-07: 150 mg via ORAL

## 2015-04-07 MED ORDER — KETOROLAC TROMETHAMINE 10 MG PO TABS
10.0000 mg | ORAL_TABLET | Freq: Once | ORAL | Status: AC
  Administered 2015-04-07: 10 mg via ORAL

## 2015-04-07 MED ORDER — KETOROLAC TROMETHAMINE 10 MG PO TABS
ORAL_TABLET | ORAL | Status: AC
  Administered 2015-04-07: 10 mg via ORAL
  Filled 2015-04-07: qty 1

## 2015-04-07 MED ORDER — FLUCONAZOLE 50 MG PO TABS
ORAL_TABLET | ORAL | Status: AC
  Filled 2015-04-07: qty 1

## 2015-04-07 MED ORDER — HYDROXYZINE PAMOATE 25 MG PO CAPS: 25.0000 mg | ORAL_CAPSULE | Freq: Three times a day (TID) | ORAL | Status: DC | PRN

## 2015-04-07 NOTE — ED Notes (Signed)
Reports vaginal itching since taking keflex last month

## 2015-04-07 NOTE — Discharge Instructions (Signed)

## 2015-04-07 NOTE — ED Notes (Signed)
Pt states she was diagnosed with herpes last year, pt states vagina is red, inflamed and burning, pt states she used some monostat last night, pt has been on keflex for a tooth infection, pt states she is using a new body wash and takes baths

## 2015-04-07 NOTE — ED Provider Notes (Signed)
[facility] Emergency Department Provider Note  ____________________________________________  Time seen: 0905  I have reviewed the triage vital signs and the nursing notes.   HISTORY  Chief Complaint Vaginal Itching    HPI Victoria Shepard is a 44 y.o. female scribes burning and discharge and inflammation of her vaginal area states that she was using Keflex off and on for tooth infection states that she also has been diagnosed with herpes in the past not because she's had lesions because she was positive and her blood she is concerned that she may have a breakout she overall rates it as a burning 6 out of 10 pain nothing making it particularly better or worse and she is arrived here today for further evaluation treatment no other complaints at this time   Past Medical History  Diagnosis Date  . Heart murmur   . Hypertension   . Hypercholesteremia   . Anxiety     There are no active problems to display for this patient.   Past Surgical History  Procedure Laterality Date  . Liposuction    . Breast surgery    . Cosmetic surgery      Current Outpatient Rx  Name  Route  Sig  Dispense  Refill  . atorvastatin (LIPITOR) 20 MG tablet   Oral   Take 20 mg by mouth daily.         . clonazePAM (KLONOPIN) 1 MG tablet   Oral   Take 1 mg by mouth 2 (two) times daily as needed for anxiety.         . hydrOXYzine (VISTARIL) 25 MG capsule   Oral   Take 1 capsule (25 mg total) by mouth 3 (three) times daily as needed for itching.   15 capsule   0   . ibuprofen (ADVIL,MOTRIN) 50 MG chewable tablet   Oral   Chew 1 tablet (50 mg total) by mouth every 8 (eight) hours as needed for fever.   24 tablet   2   . ibuprofen (ADVIL,MOTRIN) 800 MG tablet      TAKE 1 TABLET 3 TIMES A DAY FOR 5 DAYS   60 tablet   1   . ketorolac (TORADOL) 10 MG tablet   Oral   Take 1 tablet (10 mg total) by mouth every 6 (six) hours as needed.   20 tablet   0   .  traMADol (ULTRAM) 50 MG tablet      TAKE 1 TABLET EVERY 4 TO 6 HOURS   12 tablet   0     Not to exceed 5 additional fills before 08/23/2015 ...   . valACYclovir (VALTREX) 500 MG tablet   Oral   Take 1,000 mg by mouth daily.           Allergies Penicillins  History reviewed. No pertinent family history.  Social History History  Substance Use Topics  . Smoking status: Never Smoker   . Smokeless tobacco: Not on file  . Alcohol Use: No    Review of Systems  Constitutional: No fever/chills Eyes: No visual changes. ENT: No sore throat. Cardiovascular: Denies chest pain. Respiratory: Denies shortness of breath. Gastrointestinal: Nausea Genitourinary: Negative for dysuria. Vaginal discharge white Musculoskeletal: Negative for back pain. Skin: Negative for rash. Neurological: Negative for headaches, focal weakness or numbness.  10-point ROS otherwise negative.  ____________________________________________   PHYSICAL EXAM:  VITAL SIGNS: ED Triage Vitals  Enc Vitals Group     BP 04/07/15 0742 134/93 mmHg  Pulse Rate 04/07/15 0742 99     Resp 04/07/15 0742 18     Temp 04/07/15 0742 98.2 F (36.8 C)     Temp Source 04/07/15 0742 Oral     SpO2 04/07/15 0742 99 %     Weight 04/07/15 0742 172 lb (78.019 kg)     Height 04/07/15 0742 5\' 3"  (1.6 m)     Head Cir --      Peak Flow --      Pain Score 04/07/15 0739 8     Pain Loc --      Pain Edu? --      Excl. in [place]? --     Constitutional: Alert and oriented. Well appearing and in no acute distress. Eyes: Conjunctivae are normal. PERRL. EOMI. Head: Atraumatic. Nose: No congestion/rhinnorhea. Mouth/Throat: Mucous membranes are moist.  Oropharynx non-erythematous. Neck: No stridor.   Cardiovascular: Normal rate, regular rhythm. Grossly normal heart sounds.  Good peripheral circulation. Respiratory: Normal respiratory effort.  No retractions. Lungs CTAB. Gastrointestinal: Soft and nontender. No distention. No  abdominal bruits. No CVA tenderness. Genitourinary: Red and inflamed vaginal tissue with a white discharge *Musculoskeletal: No lower extremity tenderness nor edema.  No joint effusions. Neurologic:  Normal speech and language. No gross focal neurologic deficits are appreciated. Speech is normal. No gait instability. Skin:  Skin is warm, dry and intact. No rash noted. Psychiatric: Mood and affect are normal. Speech and behavior are normal.  ____________________________________________   LABS (all labs ordered are listed, but only abnormal results are displayed)  Labs Reviewed  WET PREP, GENITAL - Abnormal; Notable for the following:    WBC, Wet Prep HPF POC FEW (*)    All other components within normal limits  URINALYSIS COMPLETEWITH MICROSCOPIC ([facility] ONLY) - Abnormal; Notable for the following:    Color, Urine YELLOW (*)    APPearance HAZY (*)    Leukocytes, UA 2+ (*)    Squamous Epithelial / LPF 6-30 (*)    All other components within normal limits  CHLAMYDIA/NGC RT PCR ([facility] ONLY)      PROCEDURES  Procedure(s) performed: None  Critical Care performed: No  ____________________________________________   INITIAL IMPRESSION / ASSESSMENT AND PLAN / ED COURSE  Pertinent labs & imaging results that were available during my care of the patient were reviewed by me and considered in my medical decision making (see chart for details).  Initial impression yeast vaginitis given that the patient did use high-dose Monistat the day before I don't know how accurate the wet prep was given a B will go and treat her with a Diflucan orally started on Toradol and Atarax for symptoms have her follow-up with the health department or or her own doctor as needed for any further concerns ____________________________________________   FINAL CLINICAL IMPRESSION(S) / ED DIAGNOSES  Final diagnoses:  Yeast vaginitis     Deunte Bledsoe Verdene Rio, PA-C 04/07/15 1147  Earleen Newport, MD 04/07/15 1426

## 2015-04-14 ENCOUNTER — Telehealth: Payer: Self-pay

## 2015-04-14 MED ORDER — FLUCONAZOLE 150 MG PO TABS: 150.0000 mg | ORAL_TABLET | Freq: Once | ORAL | Status: DC

## 2015-04-14 NOTE — Telephone Encounter (Signed)
Patient was seen in ED 04/07/15 for yeast infection, pelvic done Was given Diflucan Pill She is better but still slight itch and discharge, wondering if you could write her another Diflucan or an Rx for Monistat (so Medicaid will pay) [facility]

## 2015-04-17 DIAGNOSIS — E876 Hypokalemia: Secondary | ICD-10-CM | POA: Diagnosis not present

## 2015-04-17 DIAGNOSIS — M791 Myalgia: Secondary | ICD-10-CM | POA: Diagnosis not present

## 2015-04-17 DIAGNOSIS — Z79899 Other long term (current) drug therapy: Secondary | ICD-10-CM | POA: Diagnosis not present

## 2015-04-17 DIAGNOSIS — I10 Essential (primary) hypertension: Secondary | ICD-10-CM | POA: Diagnosis not present

## 2015-04-17 DIAGNOSIS — Z880 Allergy status to penicillin: Secondary | ICD-10-CM | POA: Diagnosis not present

## 2015-04-17 DIAGNOSIS — N760 Acute vaginitis: Secondary | ICD-10-CM | POA: Insufficient documentation

## 2015-04-17 DIAGNOSIS — J029 Acute pharyngitis, unspecified: Secondary | ICD-10-CM | POA: Insufficient documentation

## 2015-04-17 DIAGNOSIS — R509 Fever, unspecified: Secondary | ICD-10-CM | POA: Diagnosis present

## 2015-04-17 NOTE — ED Notes (Signed)
Patient reports sore throat, fever and generalized body aches.

## 2015-04-18 ENCOUNTER — Emergency Department
Admission: EM | Admit: 2015-04-18 | Discharge: 2015-04-18 | Disposition: A | Discharge status: Home / Self Care | Payer: Medicaid Other | Attending: Emergency Medicine | Admitting: Emergency Medicine

## 2015-04-18 ENCOUNTER — Telehealth: Payer: Self-pay | Admitting: Emergency Medicine

## 2015-04-18 DIAGNOSIS — N760 Acute vaginitis: Secondary | ICD-10-CM

## 2015-04-18 DIAGNOSIS — E876 Hypokalemia: Secondary | ICD-10-CM

## 2015-04-18 DIAGNOSIS — M791 Myalgia, unspecified site: Secondary | ICD-10-CM

## 2015-04-18 DIAGNOSIS — B9689 Other specified bacterial agents as the cause of diseases classified elsewhere: Secondary | ICD-10-CM

## 2015-04-18 LAB — CBC
HCT: 44.3 % (ref 35.0–47.0)
Hemoglobin: 15 g/dL (ref 12.0–16.0)
MCH: 30.5 pg (ref 26.0–34.0)
MCHC: 34 g/dL (ref 32.0–36.0)
MCV: 89.7 fL (ref 80.0–100.0)
PLATELETS: 319 10*3/uL (ref 150–440)
RBC: 4.93 MIL/uL (ref 3.80–5.20)
RDW: 13 % (ref 11.5–14.5)
WBC: 9.1 10*3/uL (ref 3.6–11.0)

## 2015-04-18 LAB — CK: CK TOTAL: 65 U/L (ref 38–234)

## 2015-04-18 LAB — URINALYSIS COMPLETE WITH MICROSCOPIC (ARMC ONLY)
BILIRUBIN URINE: NEGATIVE
Glucose, UA: NEGATIVE mg/dL
Hgb urine dipstick: NEGATIVE
Ketones, ur: NEGATIVE mg/dL
Nitrite: NEGATIVE
PH: 6 (ref 5.0–8.0)
Protein, ur: NEGATIVE mg/dL
SPECIFIC GRAVITY, URINE: 1.016 (ref 1.005–1.030)

## 2015-04-18 LAB — BASIC METABOLIC PANEL
Anion gap: 12 (ref 5–15)
BUN: 10 mg/dL (ref 6–20)
CALCIUM: 9 mg/dL (ref 8.9–10.3)
CHLORIDE: 99 mmol/L — AB (ref 101–111)
CO2: 27 mmol/L (ref 22–32)
CREATININE: 0.92 mg/dL (ref 0.44–1.00)
GFR calc Af Amer: >60 mL/min (ref >60)
GFR calc non Af Amer: >60 mL/min (ref >60)
Glucose, Bld: 116 mg/dL — ABNORMAL HIGH (ref 65–99)
POTASSIUM: 2.8 mmol/L — AB (ref 3.5–5.1)
SODIUM: 138 mmol/L (ref 135–145)

## 2015-04-18 LAB — CHLAMYDIA/NGC RT PCR (ARMC ONLY)
CHLAMYDIA TR: NOT DETECTED
N gonorrhoeae: NOT DETECTED

## 2015-04-18 LAB — WET PREP, GENITAL
TRICH WET PREP: NONE SEEN
YEAST WET PREP: NONE SEEN

## 2015-04-18 MED ORDER — POTASSIUM CHLORIDE 20 MEQ PO PACK: 40.0000 meq | PACK | Freq: Once | ORAL | Status: DC

## 2015-04-18 MED ORDER — POTASSIUM CHLORIDE CRYS ER 20 MEQ PO TBCR
40.0000 meq | EXTENDED_RELEASE_TABLET | Freq: Once | ORAL | Status: AC
  Administered 2015-04-18: 40 meq via ORAL

## 2015-04-18 MED ORDER — METRONIDAZOLE 500 MG PO TABS
ORAL_TABLET | ORAL | Status: AC
  Administered 2015-04-18: 500 mg via ORAL
  Filled 2015-04-18: qty 1

## 2015-04-18 MED ORDER — POTASSIUM CHLORIDE ER 10 MEQ PO TBCR: 20.0000 meq | EXTENDED_RELEASE_TABLET | Freq: Every day | ORAL | Status: DC

## 2015-04-18 MED ORDER — METRONIDAZOLE 500 MG PO TABS
500.0000 mg | ORAL_TABLET | Freq: Once | ORAL | Status: AC
  Administered 2015-04-18: 500 mg via ORAL

## 2015-04-18 MED ORDER — POTASSIUM CHLORIDE CRYS ER 20 MEQ PO TBCR
EXTENDED_RELEASE_TABLET | ORAL | Status: AC
  Administered 2015-04-18: 40 meq via ORAL
  Filled 2015-04-18: qty 2

## 2015-04-18 MED ORDER — METRONIDAZOLE 500 MG PO TABS: 500.0000 mg | ORAL_TABLET | Freq: Two times a day (BID) | ORAL | Status: DC

## 2015-04-18 MED ORDER — SODIUM CHLORIDE 0.9 % IV BOLUS (SEPSIS)
1000.0000 mL | Freq: Once | INTRAVENOUS | Status: AC
  Administered 2015-04-18: 1000 mL via INTRAVENOUS

## 2015-04-18 NOTE — ED Provider Notes (Signed)
[facility] Emergency Department Provider Note  ____________________________________________  Time seen: Approximately 1:31 AM  I have reviewed the triage vital signs and the nursing notes.   HISTORY  Chief Complaint Fever; Generalized Body Aches; and Sore Throat    HPI Victoria Shepard is a 44 y.o. female who was here on 6/23 with inflammation, burning, vaginal itching and discharge. She was told that she had yeast vaginitis and given Diflucan for her symptoms. She reports that she continued to have the discharge itching and burning so she called her doctor and is unable to get an appointment until 8/8. She reports that she was called in another prescription for Diflucan which did help the itching and burning. She reports though that she does still have some discharge with a slight smell. She reports though that since today she's been feeling achy and nauseated. She has had a low-grade temperature from 99.2 200 day. She reports that she's also been feeling clammy. She feels some drainage going down her throat and has some mild facial pain. The patient reports that she is been taking ibuprofen 800 mg and Toradol which has not been helping the body aches go away. She reports that she has been hospitalized for sepsis in the past and is concerned that what ever vaginal infection she has may be spreading to her bloodstream. The patient reports that she did not have blood work when she was seen previously and has been feeling bloated with abdominal pain. She reports her pain is 8 out of 10 in intensity.   Past Medical History  Diagnosis Date  . Heart murmur   . Hypertension   . Hypercholesteremia   . Anxiety     There are no active problems to display for this patient.   Past Surgical History  Procedure Laterality Date  . Liposuction    . Breast surgery    . Cosmetic surgery      Current Outpatient Rx  Name  Route  Sig  Dispense  Refill  . atorvastatin  (LIPITOR) 20 MG tablet   Oral   Take 20 mg by mouth daily.         . clonazePAM (KLONOPIN) 1 MG tablet   Oral   Take 1 mg by mouth 2 (two) times daily as needed for anxiety.         . fluconazole (DIFLUCAN) 150 MG tablet   Oral   Take 1 tablet (150 mg total) by mouth once.   1 tablet   1   . hydrOXYzine (VISTARIL) 25 MG capsule   Oral   Take 1 capsule (25 mg total) by mouth 3 (three) times daily as needed for itching.   15 capsule   0   . ibuprofen (ADVIL,MOTRIN) 50 MG chewable tablet   Oral   Chew 1 tablet (50 mg total) by mouth every 8 (eight) hours as needed for fever.   24 tablet   2   . ibuprofen (ADVIL,MOTRIN) 800 MG tablet      TAKE 1 TABLET 3 TIMES A DAY FOR 5 DAYS   60 tablet   1   . ketorolac (TORADOL) 10 MG tablet   Oral   Take 1 tablet (10 mg total) by mouth every 6 (six) hours as needed.   20 tablet   0   . metroNIDAZOLE (FLAGYL) 500 MG tablet   Oral   Take 1 tablet (500 mg total) by mouth 2 (two) times daily.   14 tablet  0   . potassium chloride (K-DUR) 10 MEQ tablet   Oral   Take 2 tablets (20 mEq total) by mouth daily.   8 tablet   0   . traMADol (ULTRAM) 50 MG tablet      TAKE 1 TABLET EVERY 4 TO 6 HOURS   12 tablet   0     Not to exceed 5 additional fills before 08/23/2015 ...   . valACYclovir (VALTREX) 500 MG tablet   Oral   Take 1,000 mg by mouth daily.           Allergies Penicillins; Erythromycin; and Keflex  No family history on file.  Social History History  Substance Use Topics  . Smoking status: Never Smoker   . Smokeless tobacco: Not on file  . Alcohol Use: No    Review of Systems Constitutional: No fever/chills Eyes: No visual changes. ENT:  sore throat. Cardiovascular: Denies chest pain. Respiratory: Denies shortness of breath. Gastrointestinal: Nausea with No abdominal pain.  no vomiting.  No diarrhea.  No constipation. Genitourinary: Vaginal discharge but Negative for dysuria. Musculoskeletal:  Negative for back pain. Skin: Negative for rash. Neurological: Negative for headaches, focal weakness or numbness.  10-point ROS otherwise negative.  ____________________________________________   PHYSICAL EXAM:  VITAL SIGNS: ED Triage Vitals  Enc Vitals Group     BP 04/17/15 2120 124/99 mmHg     Pulse Rate 04/17/15 2120 110     Resp 04/17/15 2120 20     Temp 04/17/15 2120 98.6 F (37 C)     Temp Source 04/17/15 2120 Oral     SpO2 04/17/15 2120 98 %     Weight 04/17/15 2120 172 lb (78.019 kg)     Height 04/17/15 2120 5\' 3"  (1.6 m)     Head Cir --      Peak Flow --      Pain Score 04/17/15 2121 8     Pain Loc --      Pain Edu? --      Excl. in [place]? --     Constitutional: Alert and oriented. Well appearing and in no acute distress. Eyes: Conjunctivae are normal. PERRL. EOMI. Head: Atraumatic. Nose: No congestion/rhinnorhea. Mouth/Throat: Mucous membranes are moist.  Oropharynx non-erythematous. Neck: no palpable, cervical lymphadenopathy Cardiovascular: Tachycardia with regular rhythm. Grossly normal heart sounds.  Good peripheral circulation. Respiratory: Normal respiratory effort.  No retractions. Lungs CTAB. Gastrointestinal: Soft and nontender. No distention. Positive bowel sounds Genitourinary: Normal external genitalia with mild vaginal discharge no pain with exam no cervical motion tenderness, bimanual exam negative Musculoskeletal: No lower extremity tenderness nor edema.  No joint effusions. Neurologic:  Normal speech and language.  Skin:  Skin is warm, dry and intact. No rash noted. Psychiatric: Mood and affect are normal.   ____________________________________________   LABS (all labs ordered are listed, but only abnormal results are displayed)  Labs Reviewed  WET PREP, GENITAL - Abnormal; Notable for the following:    Clue Cells Wet Prep HPF POC MODERATE (*)    WBC, Wet Prep HPF POC MODERATE (*)    All other components within normal limits  BASIC  METABOLIC PANEL - Abnormal; Notable for the following:    Potassium 2.8 (*)    Chloride 99 (*)    Glucose, Bld 116 (*)    All other components within normal limits  URINALYSIS COMPLETEWITH MICROSCOPIC ([facility] ONLY) - Abnormal; Notable for the following:    Color, Urine YELLOW (*)    APPearance HAZY (*)  Leukocytes, UA 1+ (*)    Bacteria, UA RARE (*)    Squamous Epithelial / LPF 6-30 (*)    All other components within normal limits  CHLAMYDIA/NGC RT PCR ([facility] ONLY)  CBC  CK   ____________________________________________  EKG  None ____________________________________________  RADIOLOGY  None ____________________________________________   PROCEDURES  Procedure(s) performed: None  Critical Care performed: No  ____________________________________________   INITIAL IMPRESSION / ASSESSMENT AND PLAN / ED COURSE  Pertinent labs & imaging results that were available during my care of the patient were reviewed by me and considered in my medical decision making (see chart for details).  This is a 44 year old female who comes in today with body aches, vaginal discharge and some sore throat. The patient has taken Diflucan 2 doses and is concerned about possible STDs and spread of infection. The patient's blood work is unremarkable but she does have bacterial vaginosis on exam as well as low potassium. The patient does not have low magnesium or elevated white blood cell count. The patient's blood work is also unremarkable. I will give the patient a dose of potassium as well as metronidazole and I will discharge the patient to home. I explained to the patient that she does need to follow-up with her primary care physician if the symptoms tend to persist. ____________________________________________   FINAL CLINICAL IMPRESSION(S) / ED DIAGNOSES  Final diagnoses:  Bacterial vaginosis  Myalgia  Hypokalemia      Loney Hering, MD 04/18/15 0501

## 2015-04-18 NOTE — ED Notes (Signed)
MD notified of pts potassium 2.8

## 2015-04-18 NOTE — Discharge Instructions (Signed)
Bacterial Vaginosis Bacterial vaginosis is a vaginal infection that occurs when the normal balance of bacteria in the vagina is disrupted. It results from an overgrowth of certain bacteria. This is the most common vaginal infection in women of childbearing age. Treatment is important to prevent complications, especially in pregnant women, as it can cause a premature delivery. CAUSES  Bacterial vaginosis is caused by an increase in harmful bacteria that are normally present in smaller amounts in the vagina. Several different kinds of bacteria can cause bacterial vaginosis. However, the reason that the condition develops is not fully understood. RISK FACTORS Certain activities or behaviors can put you at an increased risk of developing bacterial vaginosis, including:  Having a new sex partner or multiple sex partners.  Douching.  Using an intrauterine device (IUD) for contraception. Women do not get bacterial vaginosis from toilet seats, bedding, swimming pools, or contact with objects around them. SIGNS AND SYMPTOMS  Some women with bacterial vaginosis have no signs or symptoms. Common symptoms include:  Grey vaginal discharge.  A fishlike odor with discharge, especially after sexual intercourse.  Itching or burning of the vagina and vulva.  Burning or pain with urination. DIAGNOSIS  Your health care provider will take a medical history and examine the vagina for signs of bacterial vaginosis. A sample of vaginal fluid may be taken. Your health care provider will look at this sample under a microscope to check for bacteria and abnormal cells. A vaginal pH test may also be done.  TREATMENT  Bacterial vaginosis may be treated with antibiotic medicines. These may be given in the form of a pill or a vaginal cream. A second round of antibiotics may be prescribed if the condition comes back after treatment.  HOME CARE INSTRUCTIONS   Only take over-the-counter or prescription medicines as  directed by your health care provider.  If antibiotic medicine was prescribed, take it as directed. Make sure you finish it even if you start to feel better.  Do not have sex until treatment is completed.  Tell all sexual partners that you have a vaginal infection. They should see their health care provider and be treated if they have problems, such as a mild rash or itching.  Practice safe sex by using condoms and only having one sex partner. SEEK MEDICAL CARE IF:   Your symptoms are not improving after 3 days of treatment.  You have increased discharge or pain.  You have a fever. MAKE SURE YOU:   Understand these instructions.  Will watch your condition.  Will get help right away if you are not doing well or get worse. FOR MORE INFORMATION  Centers for Disease Control and Prevention, Division of STD Prevention: [employer]raiser[employer]raud[employer] American Sexual Health Association (ASHA): www.ashastd.org  Document Released: 10/01/2005 Document Revised: 07/22/2013 Document Reviewed: 05/13/2013 [facility] Patient Information 2015 [place], [place]. This information is not intended to replace advice given to you by your health care provider. Make sure you discuss any questions you have with your health care provider.  Hypokalemia Hypokalemia means that the amount of potassium in the blood is lower than normal.Potassium is a chemical, called an electrolyte, that helps regulate the amount of fluid in the body. It also stimulates muscle contraction and helps nerves function properly.Most of the body's potassium is inside of cells, and only a very small amount is in the blood. Because the amount in the blood is so small, minor changes can be life-threatening. CAUSES  Antibiotics.  Diarrhea or vomiting.  Using laxatives too  much, which can cause diarrhea.  Chronic kidney disease.  Water pills (diuretics).  Eating disorders (bulimia).  Low magnesium level.  Sweating a lot. SIGNS AND  SYMPTOMS  Weakness.  Constipation.  Fatigue.  Muscle cramps.  Mental confusion.  Skipped heartbeats or irregular heartbeat (palpitations).  Tingling or numbness. DIAGNOSIS  Your health care provider can diagnose hypokalemia with blood tests. In addition to checking your potassium level, your health care provider may also check other lab tests. TREATMENT Hypokalemia can be treated with potassium supplements taken by mouth or adjustments in your current medicines. If your potassium level is very low, you may need to get potassium through a vein (IV) and be monitored in the hospital. A diet high in potassium is also helpful. Foods high in potassium are:  Nuts, such as peanuts and pistachios.  Seeds, such as sunflower seeds and pumpkin seeds.  Peas, lentils, and lima beans.  Whole grain and bran cereals and breads.  Fresh fruit and vegetables, such as apricots, avocado, bananas, cantaloupe, kiwi, oranges, tomatoes, asparagus, and potatoes.  Orange and tomato juices.  Red meats.  Fruit yogurt. HOME CARE INSTRUCTIONS  Take all medicines as prescribed by your health care provider.  Maintain a healthy diet by including nutritious food, such as fruits, vegetables, nuts, whole grains, and lean meats.  If you are taking a laxative, be sure to follow the directions on the label. SEEK MEDICAL CARE IF:  Your weakness gets worse.  You feel your heart pounding or racing.  You are vomiting or having diarrhea.  You are diabetic and having trouble keeping your blood glucose in the normal range. SEEK IMMEDIATE MEDICAL CARE IF:  You have chest pain, shortness of breath, or dizziness.  You are vomiting or having diarrhea for more than 2 days.  You faint. MAKE SURE YOU:   Understand these instructions.  Will watch your condition.  Will get help right away if you are not doing well or get worse. Document Released: 10/01/2005 Document Revised: 07/22/2013 Document Reviewed:  04/03/2013 [facility] Patient Information 2015 [place], [place]. This information is not intended to replace advice given to you by your health care provider. Make sure you discuss any questions you have with your health care provider.

## 2015-04-28 DIAGNOSIS — F429 Obsessive-compulsive disorder, unspecified: Secondary | ICD-10-CM | POA: Insufficient documentation

## 2015-04-28 DIAGNOSIS — F329 Major depressive disorder, single episode, unspecified: Secondary | ICD-10-CM | POA: Insufficient documentation

## 2015-04-28 DIAGNOSIS — I10 Essential (primary) hypertension: Secondary | ICD-10-CM | POA: Insufficient documentation

## 2015-04-28 DIAGNOSIS — F32A Depression, unspecified: Secondary | ICD-10-CM | POA: Insufficient documentation

## 2015-04-28 DIAGNOSIS — F419 Anxiety disorder, unspecified: Secondary | ICD-10-CM | POA: Insufficient documentation

## 2015-04-29 ENCOUNTER — Ambulatory Visit (INDEPENDENT_AMBULATORY_CARE_PROVIDER_SITE_OTHER): Payer: Medicaid Other | Admitting: Family Medicine

## 2015-04-29 ENCOUNTER — Encounter: Payer: Self-pay | Admitting: Family Medicine

## 2015-04-29 ENCOUNTER — Telehealth: Payer: Self-pay

## 2015-04-29 ENCOUNTER — Ambulatory Visit: Payer: Medicaid Other | Admitting: Family Medicine

## 2015-04-29 VITALS — BP 116/82 | HR 97 | Temp 99.0°F | Wt 166.0 lb

## 2015-04-29 DIAGNOSIS — Z202 Contact with and (suspected) exposure to infections with a predominantly sexual mode of transmission: Secondary | ICD-10-CM | POA: Diagnosis not present

## 2015-04-29 DIAGNOSIS — B009 Herpesviral infection, unspecified: Secondary | ICD-10-CM

## 2015-04-29 DIAGNOSIS — I10 Essential (primary) hypertension: Secondary | ICD-10-CM | POA: Diagnosis not present

## 2015-04-29 DIAGNOSIS — E7800 Pure hypercholesterolemia, unspecified: Secondary | ICD-10-CM | POA: Insufficient documentation

## 2015-04-29 DIAGNOSIS — R1011 Right upper quadrant pain: Secondary | ICD-10-CM

## 2015-04-29 DIAGNOSIS — R634 Abnormal weight loss: Secondary | ICD-10-CM | POA: Insufficient documentation

## 2015-04-29 DIAGNOSIS — E876 Hypokalemia: Secondary | ICD-10-CM

## 2015-04-29 DIAGNOSIS — R011 Cardiac murmur, unspecified: Secondary | ICD-10-CM | POA: Diagnosis not present

## 2015-04-29 DIAGNOSIS — J029 Acute pharyngitis, unspecified: Secondary | ICD-10-CM

## 2015-04-29 DIAGNOSIS — Z5181 Encounter for therapeutic drug level monitoring: Secondary | ICD-10-CM

## 2015-04-29 LAB — CBC WITH DIFFERENTIAL/PLATELET
Hematocrit: 46.1 % (ref 34.0–46.6)
Hemoglobin: 15.6 g/dL (ref 11.1–15.9)
LYMPHS: 31 %
Lymphocytes Absolute: 3.4 10*3/uL — ABNORMAL HIGH (ref 0.7–3.1)
MCH: 30.6 pg (ref 26.6–33.0)
MCHC: 33.8 g/dL (ref 31.5–35.7)
MCV: 91 fL (ref 79–97)
MID (ABSOLUTE): 0.9 10*3/uL (ref 0.1–1.6)
MID: 8 %
NEUTROS ABS: 6.8 10*3/uL (ref 1.4–7.0)
Neutrophils: 61 %
Platelets: 383 10*3/uL — ABNORMAL HIGH (ref 150–379)
RBC: 5.09 x10E6/uL (ref 3.77–5.28)
RDW: 13.7 % (ref 12.3–15.4)
WBC: 11.1 10*3/uL — AB (ref 3.4–10.8)

## 2015-04-29 LAB — ALT (SGPT) PICCOLO, WAIVED: ALT (SGPT) PICCOLO, WAIVED: 37 U/L (ref 10–47)

## 2015-04-29 LAB — MONONUCLEOSIS SCREEN: MONO SCREEN: NEGATIVE

## 2015-04-29 LAB — AST (SGOT) PICCOLO, WAIVED: AST (SGOT) PICCOLO, WAIVED: 36 U/L (ref 11–38)

## 2015-04-29 MED ORDER — NYSTATIN 100000 UNIT/ML MT SUSP: 7.5000 mL | Freq: Four times a day (QID) | OROMUCOSAL | Status: DC

## 2015-04-29 NOTE — Patient Instructions (Addendum)
We'll let you know about the lab results Do return next week for recheck Start the medicine by mouth, swish it around and then swallow four times a day Try vitamin C (orange juice if not diabetic or vitamin C tablets) and drink green tea to help your immune system during your illness Get plenty of rest and hydration Do NOT use laxatives; try Miralax instead (one capful daily mixed in 8 ounces of water)  Safe Sex Safe sex is about reducing the risk of giving or getting a sexually transmitted disease (STD). STDs are spread through sexual contact involving the genitals, mouth, or rectum. Some STDs can be cured and others cannot. Safe sex can also prevent unintended pregnancies.  WHAT ARE SOME SAFE SEX PRACTICES?  Limit your sexual activity to only one partner who is having sex with only you.  Talk to your partner about his or her past partners, past STDs, and drug use.  Use a condom every time you have sexual intercourse. This includes vaginal, oral, and anal sexual activity. Both females and males should wear condoms during oral sex. Only use latex or polyurethane condoms and water-based lubricants. Using petroleum-based lubricants or oils to lubricate a condom will weaken the condom and increase the chance that it will break. The condom should be in place from the beginning to the end of sexual activity. Wearing a condom reduces, but does not completely eliminate, your risk of getting or giving an STD. STDs can be spread by contact with infected body fluids and skin.  Get vaccinated for hepatitis B and HPV.  Avoid alcohol and recreational drugs, which can affect your judgment. You may forget to use a condom or participate in high-risk sex.  For females, avoid douching after sexual intercourse. Douching can spread an infection farther into the reproductive tract.  Check your body for signs of sores, blisters, rashes, or unusual discharge. See your health care provider if you notice any of these  signs.  Avoid sexual contact if you have symptoms of an infection or are being treated for an STD. If you or your partner has herpes, avoid sexual contact when blisters are present. Use condoms at all other times.  If you are at risk of being infected with HIV, it is recommended that you take a prescription medicine daily to prevent HIV infection. This is called pre-exposure prophylaxis (PrEP). You are considered at risk if:  You are a man who has sex with other men (MSM).  You are a heterosexual man or woman who is sexually active with more than one partner.  You take drugs by injection.  You are sexually active with a partner who has HIV.  Talk with your health care provider about whether you are at high risk of being infected with HIV. If you choose to begin PrEP, you should first be tested for HIV. You should then be tested every 3 months for as long as you are taking PrEP.  See your health care provider for regular screenings, exams, and tests for other STDs. Before having sex with a new partner, each of you should be screened for STDs and should talk about the results with each other. WHAT ARE THE BENEFITS OF SAFE SEX?   There is less chance of getting or giving an STD.  You can prevent unwanted or unintended pregnancies.  By discussing safe sex concerns with your partner, you may increase feelings of intimacy, comfort, trust, and honesty between the two of you. Document Released: 11/08/2004 Document  Revised: 02/15/2014 Document Reviewed: 03/24/2012 [facility] Patient Information 2015 [place], [place]. This information is not intended to replace advice given to you by your health care provider. Make sure you discuss any questions you have with your health care provider. DASH Eating Plan DASH stands for "Dietary Approaches to Stop Hypertension." The DASH eating plan is a healthy eating plan that has been shown to reduce high blood pressure (hypertension). Additional health benefits may  include reducing the risk of type 2 diabetes mellitus, heart disease, and stroke. The DASH eating plan may also help with weight loss. WHAT DO I NEED TO KNOW ABOUT THE DASH EATING PLAN? For the DASH eating plan, you will follow these general guidelines:  Choose foods with a percent daily value for sodium of less than 5% (as listed on the food label).  Use salt-free seasonings or herbs instead of table salt or sea salt.  Check with your health care provider or pharmacist before using salt substitutes.  Eat lower-sodium products, often labeled as "lower sodium" or "no salt added."  Eat fresh foods.  Eat more vegetables, fruits, and low-fat dairy products.  Choose whole grains. Look for the word "whole" as the first word in the ingredient list.  Choose fish and skinless chicken or [place] more often than red meat. Limit fish, poultry, and meat to 6 oz (170 g) each day.  Limit sweets, desserts, sugars, and sugary drinks.  Choose heart-healthy fats.  Limit cheese to 1 oz (28 g) per day.  Eat more home-cooked food and less restaurant, buffet, and fast food.  Limit fried foods.  Cook foods using methods other than frying.  Limit canned vegetables. If you do use them, rinse them well to decrease the sodium.  When eating at a restaurant, ask that your food be prepared with less salt, or no salt if possible. WHAT FOODS CAN I EAT? Seek help from a dietitian for individual calorie needs. Grains Whole grain or whole wheat bread. Brown rice. Whole grain or whole wheat pasta. Quinoa, bulgur, and whole grain cereals. Low-sodium cereals. Corn or whole wheat flour tortillas. Whole grain cornbread. Whole grain crackers. Low-sodium crackers. Vegetables Fresh or frozen vegetables (raw, steamed, roasted, or grilled). Low-sodium or reduced-sodium tomato and vegetable juices. Low-sodium or reduced-sodium tomato sauce and paste. Low-sodium or reduced-sodium canned vegetables.  Fruits All fresh,  canned (in natural juice), or frozen fruits. Meat and Other Protein Products Ground beef (85% or leaner), grass-fed beef, or beef trimmed of fat. Skinless chicken or [place]. Ground chicken or [place]. Pork trimmed of fat. All fish and seafood. Eggs. Dried beans, peas, or lentils. Unsalted nuts and seeds. Unsalted canned beans. Dairy Low-fat dairy products, such as skim or 1% milk, 2% or reduced-fat cheeses, low-fat ricotta or cottage cheese, or plain low-fat yogurt. Low-sodium or reduced-sodium cheeses. Fats and Oils Tub margarines without trans fats. Light or reduced-fat mayonnaise and salad dressings (reduced sodium). Avocado. Safflower, olive, or canola oils. Natural peanut or almond butter. Other Unsalted popcorn and pretzels. The items listed above may not be a complete list of recommended foods or beverages. Contact your dietitian for more options. WHAT FOODS ARE NOT RECOMMENDED? Grains White bread. White pasta. White rice. Refined cornbread. Bagels and croissants. Crackers that contain trans fat. Vegetables Creamed or fried vegetables. Vegetables in a cheese sauce. Regular canned vegetables. Regular canned tomato sauce and paste. Regular tomato and vegetable juices. Fruits Dried fruits. Canned fruit in light or heavy syrup. Fruit juice. Meat and Other Protein Products Fatty cuts  of meat. Ribs, chicken wings, bacon, sausage, bologna, salami, chitterlings, fatback, hot dogs, bratwurst, and packaged luncheon meats. Salted nuts and seeds. Canned beans with salt. Dairy Whole or 2% milk, cream, half-and-half, and cream cheese. Whole-fat or sweetened yogurt. Full-fat cheeses or blue cheese. Nondairy creamers and whipped toppings. Processed cheese, cheese spreads, or cheese curds. Condiments Onion and garlic salt, seasoned salt, table salt, and sea salt. Canned and packaged gravies. Worcestershire sauce. Tartar sauce. Barbecue sauce. Teriyaki sauce. Soy sauce, including reduced sodium. Steak  sauce. Fish sauce. Oyster sauce. Cocktail sauce. Horseradish. Ketchup and mustard. Meat flavorings and tenderizers. Bouillon cubes. Hot sauce. Tabasco sauce. Marinades. Taco seasonings. Relishes. Fats and Oils Butter, stick margarine, lard, shortening, ghee, and bacon fat. Coconut, palm kernel, or palm oils. Regular salad dressings. Other Pickles and olives. Salted popcorn and pretzels. The items listed above may not be a complete list of foods and beverages to avoid. Contact your dietitian for more information. WHERE CAN I FIND MORE INFORMATION? National Heart, Lung, and Blood Institute: travelstabloid.com Document Released: 09/20/2011 Document Revised: 02/15/2014 Document Reviewed: 08/05/2013 [facility] Patient Information 2015 [place], [place]. This information is not intended to replace advice given to you by your health care provider. Make sure you discuss any questions you have with your health care provider.

## 2015-04-29 NOTE — Assessment & Plan Note (Addendum)
Not heard on exam today; no physical signs to suggest endocarditis but she had artificial fingernails on; consider blood cultures and echo if fatigue and fevers persist without discernable etiology

## 2015-04-29 NOTE — Assessment & Plan Note (Addendum)
Patient has a 37 pound weight loss over 6 months; check TSH, other labs; concerning, obviously and will want to consider and rule-out malignancy; return to clinic in five days for reassessment; RUQ [place] ordered; primary may consider colonoscopy at f/u given her difficulty voiding (she says she has talked to him about this already, so will leave colonoscopy discussion to him at f/u next week)

## 2015-04-29 NOTE — Assessment & Plan Note (Signed)
Long-standing HTN; encouraged DASH guidelines, information given in after visit summary

## 2015-04-29 NOTE — Progress Notes (Signed)
BP 116/82 mmHg  Pulse 97  Temp(Src) 99 F (37.2 C)  Wt 166 lb (75.297 kg)  SpO2 100%   Subjective:    Patient ID: Victoria Shepard, female    DOB: 1971/05/26, 44 y.o.   MRN: [id number]  HPI: Victoria Shepard is a 44 y.o. female  Chief Complaint  Patient presents with  . OTHER    thrush   She went to the ER in may and then was put on cephalexin; then she got itching down below and then started the cephalexin and it flared up bad; went back to the hospital on June 23rd and they checked her for all STDs, everything was negative; she was diagnosed with herpes but that didn't show up; she has used some monistat the night before and it threw off the test; treated with diflucan and it was getting better, then a few weeks later, got chills and clammy palms, sore throat, aching all over; just not feeling good; went back on July 3rd to the ER and this time they did another STD check and that was okay per patient (HIV and syphilis not checked); doctor did pelvic exam and urine and blood counts; she had bacterial vaginosis and treated her with flagyl, treated with oral potassium for several days; her BP was up at 150 at one point, heart rate went up; they gave her fluids and potassium there and have her potassium; they wanted it rechecked and she is still tired and achy; she has had unprotected sex, two recent partners in the last few months; most recent partner has a daughter who is a Marine scientist and she asked the nurse to have her father checked; the nurse called patient back and said her father was "clean" patient asked to see paperwork, but nurse said that was against HIPAA; he dumped her and changed the password on their Facebook page; the other partner has a sister who is a Designer, jewellery so she thought he would be clean   Relevant past medical, surgical, family and social history reviewed and updated as indicated. Interim medical history since our last visit reviewed.  Past Medical History   Diagnosis Date  . Heart murmur   . Hypercholesteremia   . Anxiety   . Hypertension   . Depression   . OCD (obsessive compulsive disorder)   . Nexplanon in place     placed 10/11/14, remove 10/11/17  . History of abnormal mammogram 2010  . History of cellulitis     belly button  Hx of sepsis, hospitalized in ICU 2015, 2014; abdominal wall abscess  Allergies and medications reviewed and updated. Family History  Problem Relation Age of Onset  . Hypertension Mother   . Hyperlipidemia Mother   . Diabetes Mother   . Heart disease Father     CABG  . Hypertension Father   . Mental illness Brother     anxiety  . Cancer Maternal Grandmother     lung  . Stroke Paternal Grandmother   . Stroke Paternal Grandfather    Review of Systems  Constitutional: Positive for fever (100.8 at the hospital), fatigue and unexpected weight change (she weighed 203 pounds in January; now 166 (weight down 37 pounds in 6 months)).       No night sweats  HENT: Positive for sore throat.   Gastrointestinal: Positive for constipation (she has to have laxatives to go to the bathroom). Negative for nausea and abdominal pain.       Using laxatives to  have BMs  Genitourinary: Negative for genital sores (history of HSV).  Musculoskeletal: Positive for myalgias (achy all over; wonders if she has fibromyalgia).  Hematological: Negative for adenopathy.  Per HPI unless specifically indicated above     Objective:    BP 116/82 mmHg  Pulse 97  Temp(Src) 99 F (37.2 C)  Wt 166 lb (75.297 kg)  SpO2 100%  Wt Readings from Last 3 Encounters:  04/29/15 166 lb (75.297 kg)  02/17/15 172 lb (78.019 kg)  04/17/15 172 lb (78.019 kg)    Physical Exam  Constitutional: She appears well-developed and well-nourished. She is cooperative.  Obese; weight loss noted  HENT:  Head: Normocephalic and atraumatic.  Right Ear: Hearing normal.  Left Ear: Hearing normal.  Nose: Septal deviation present. No rhinorrhea.   Mouth/Throat: Mucous membranes are not pale and not dry. No dental caries. Posterior oropharyngeal erythema present. No oropharyngeal exudate or posterior oropharyngeal edema.  Eyes: EOM are normal. No scleral icterus.  Neck: No thyroid mass and no thyromegaly present.  Cardiovascular: Normal rate and regular rhythm.   Pulmonary/Chest: Effort normal and breath sounds normal. She has no decreased breath sounds. She has no wheezes. She has no rhonchi.  Lymphadenopathy:       Head (right side): No submandibular, no preauricular, no posterior auricular and no occipital adenopathy present.       Head (left side): No submandibular, no preauricular, no posterior auricular and no occipital adenopathy present.    She has cervical adenopathy.       Right cervical: Posterior cervical (shoddy) adenopathy present.       Left cervical: Posterior cervical (shoddy) adenopathy present.  Neurological: [name].  Skin: No bruising, no ecchymosis, no petechiae, no purpura and no rash noted.  Psychiatric: Her speech is normal and behavior is normal. Judgment and thought content normal. Her mood appears not anxious. Cognition and memory are normal. She does not exhibit a depressed mood.    Results for orders placed or performed during the hospital encounter of 04/18/15  Wet prep, genital  Result Value Ref Range   Yeast Wet Prep HPF POC NONE SEEN NONE SEEN   Trich, Wet Prep NONE SEEN NONE SEEN   Clue Cells Wet Prep HPF POC MODERATE (A) NONE SEEN   WBC, Wet Prep HPF POC MODERATE (A) NONE SEEN  Chlamydia/NGC rt PCR ([facility] only)  Result Value Ref Range   Specimen source GC/Chlam ENDOCERVICAL    Chlamydia Tr NOT DETECTED NOT DETECTED   N gonorrhoeae NOT DETECTED NOT DETECTED  CBC  Result Value Ref Range   WBC 9.1 3.6 - 11.0 K/uL   RBC 4.93 3.80 - 5.20 MIL/uL   Hemoglobin 15.0 12.0 - 16.0 g/dL   HCT 44.3 35.0 - 47.0 %   MCV 89.7 80.0 - 100.0 fL   MCH 30.5 26.0 - 34.0 pg   MCHC 34.0 32.0 - 36.0 g/dL    RDW 13.0 11.5 - 14.5 %   Platelets 319 150 - 440 K/uL  Basic metabolic panel  Result Value Ref Range   Sodium 138 135 - 145 mmol/L   Potassium 2.8 (LL) 3.5 - 5.1 mmol/L   Chloride 99 (L) 101 - 111 mmol/L   CO2 27 22 - 32 mmol/L   Glucose, Bld 116 (H) 65 - 99 mg/dL   BUN 10 6 - 20 mg/dL   Creatinine, Ser 0.92 0.44 - 1.00 mg/dL   Calcium 9.0 8.9 - 10.3 mg/dL   GFR calc non Af Amer >  60 >60 mL/min   GFR calc Af Amer >60 >60 mL/min   Anion gap 12 5 - 15  CK  Result Value Ref Range   Total CK 65 38 - 234 U/L  Urinalysis complete, with microscopic ([facility] only)  Result Value Ref Range   Color, Urine YELLOW (A) YELLOW   APPearance HAZY (A) CLEAR   Glucose, UA NEGATIVE NEGATIVE mg/dL   Bilirubin Urine NEGATIVE NEGATIVE   Ketones, ur NEGATIVE NEGATIVE mg/dL   Specific Gravity, Urine 1.016 1.005 - 1.030   Hgb urine dipstick NEGATIVE NEGATIVE   pH 6.0 5.0 - 8.0   Protein, ur NEGATIVE NEGATIVE mg/dL   Nitrite NEGATIVE NEGATIVE   Leukocytes, UA 1+ (A) NEGATIVE   RBC / HPF 0-5 0 - 5 RBC/hpf   WBC, UA 6-30 0 - 5 WBC/hpf   Bacteria, UA RARE (A) NONE SEEN   Squamous Epithelial / LPF 6-30 (A) NONE SEEN   Mucous PRESENT    Hyaline Casts, UA PRESENT       Assessment & Plan:   Problem List Items Addressed This Visit      Cardiovascular and Mediastinum   Hypertension    Long-standing HTN; encouraged DASH guidelines, information given in after visit summary        Other   Herpes simplex   Relevant Medications   nystatin (MYCOSTATIN) 100000 UNIT/ML suspension   High cholesterol   Heart murmur    Not heard on exam today; no physical signs to suggest endocarditis but she had artificial fingernails on; consider blood cultures and echo if fatigue and fevers persist without discernable etiology      Weight loss    Patient has a 37 pound weight loss over 6 months; check TSH, other labs; concerning, obviously and will want to consider and rule-out malignancy; return to clinic in five days  for reassessment; RUQ [place] ordered; primary may consider colonoscopy at f/u given her difficulty voiding (she says she has talked to him about this already, so will leave colonoscopy discussion to him at f/u next week)      Relevant Orders   TSH   US Abdomen Limited RUQ    Other Visit Diagnoses    Acute pharyngitis, unspecified pharyngitis type    -  Primary    may be secondary to thrush, symptoms go further down throat; nystatin swish and swallow; mono pending    Relevant Orders    CBC With Differential/Platelet    Mononucleosis screen    Strep Gp A Ag, IA W/Reflex    HIV-1 RNA ultraquant reflex to gentyp+    Hypokalemia        check K+ and Mg2+ today; very low potassium in ER noted; advised AGAINST using laxatives; may use Miralax; primary to consider colonoscopy, RTC in 5 days    Relevant Orders    Magnesium    Basic metabolic panel    Medication monitoring encounter        check SGPT on atorvastatin, electrolytes on laxatives    Relevant Orders    ALT (SGPT) Piccolo, Waived    AST (SGOT) Piccolo, Waived    Right upper quadrant pain        check LFTs today. prder [place]; mono test pending    Relevant Orders    US Abdomen Limited RUQ    Sexually transmitted disease exposure        safe sex practices encouraged; with two recent new partners followed by fever and sore throat and fatigue, check  HIV, and recheck at 8 weeks and 6 months        Follow up plan: Return in about 5 days (around 05/04/2015).  Orders Placed This Encounter  Procedures  . Strep Gp A Ag, IA W/Reflex  . US Abdomen Limited RUQ  . CBC With Differential/Platelet  . Mononucleosis screen  . Magnesium  . Basic metabolic panel  . HIV-1 RNA ultraquant reflex to gentyp+  . TSH  . ALT (SGPT) Piccolo, [place]  . AST (SGOT) Piccolo, [place]   Call patient at [phone]

## 2015-04-29 NOTE — Telephone Encounter (Signed)
Notified patient of negative mono test

## 2015-04-30 LAB — BASIC METABOLIC PANEL
BUN / CREAT RATIO: 11 (ref 9–23)
BUN: 12 mg/dL (ref 6–24)
CHLORIDE: 95 mmol/L — AB (ref 97–108)
CO2: 23 mmol/L (ref 18–29)
Calcium: 10 mg/dL (ref 8.7–10.2)
Creatinine, Ser: 1.11 mg/dL — ABNORMAL HIGH (ref 0.57–1.00)
GFR calc non Af Amer: 61 mL/min/{1.73_m2} (ref >59)
GFR, EST AFRICAN AMERICAN: 70 mL/min/{1.73_m2} (ref >59)
Glucose: 85 mg/dL (ref 65–99)
Potassium: 3.9 mmol/L (ref 3.5–5.2)
SODIUM: 139 mmol/L (ref 134–144)

## 2015-04-30 LAB — TSH: TSH: 2.59 u[IU]/mL (ref 0.450–4.500)

## 2015-04-30 LAB — MAGNESIUM: Magnesium: 2.2 mg/dL (ref 1.6–2.3)

## 2015-05-01 LAB — STREP GP A AG, IA W/REFLEX: STREP A CULTURE: NEGATIVE

## 2015-05-02 ENCOUNTER — Ambulatory Visit
Admission: RE | Admit: 2015-05-02 | Discharge: 2015-05-02 | Disposition: A | Discharge status: Home / Self Care | Payer: Medicaid Other | Source: Ambulatory Visit | Attending: Family Medicine | Admitting: Family Medicine

## 2015-05-02 DIAGNOSIS — R1011 Right upper quadrant pain: Secondary | ICD-10-CM

## 2015-05-02 DIAGNOSIS — R634 Abnormal weight loss: Secondary | ICD-10-CM | POA: Insufficient documentation

## 2015-05-02 LAB — HIV-1 RNA ULTRAQUANT REFLEX TO GENTYP+: HIV1 RNA # SerPl PCR: <20 copies/mL

## 2015-05-03 ENCOUNTER — Telehealth: Payer: Self-pay

## 2015-05-03 DIAGNOSIS — Z113 Encounter for screening for infections with a predominantly sexual mode of transmission: Secondary | ICD-10-CM

## 2015-05-03 NOTE — Telephone Encounter (Signed)
Patient notified.She will come in tomorrow am for labs.

## 2015-05-03 NOTE — Telephone Encounter (Signed)
A thousand pardons, but we actually need to redraw it; I found out that I entered the wrong test code in [place], our new system, so we're having to redraw all HIV tests Please have her schedule another lab test

## 2015-05-03 NOTE — Telephone Encounter (Signed)
She called wanting the results of her HIV test.

## 2015-05-04 ENCOUNTER — Encounter: Payer: Self-pay | Admitting: Family Medicine

## 2015-05-04 ENCOUNTER — Ambulatory Visit (INDEPENDENT_AMBULATORY_CARE_PROVIDER_SITE_OTHER): Payer: Medicaid Other | Admitting: Family Medicine

## 2015-05-04 ENCOUNTER — Other Ambulatory Visit: Payer: Medicaid Other

## 2015-05-04 VITALS — BP 110/76 | HR 102 | Temp 99.2°F | Ht 61.2 in | Wt 165.0 lb

## 2015-05-04 DIAGNOSIS — Z113 Encounter for screening for infections with a predominantly sexual mode of transmission: Secondary | ICD-10-CM

## 2015-05-04 DIAGNOSIS — F419 Anxiety disorder, unspecified: Secondary | ICD-10-CM

## 2015-05-04 MED ORDER — TRAZODONE HCL 50 MG PO TABS: 25.0000 mg | ORAL_TABLET | Freq: Every evening | ORAL | Status: DC | PRN

## 2015-05-04 NOTE — Assessment & Plan Note (Signed)
Pt with a lot of anxiety over STDs HIV pending Discuss and review Discuss birth control and pt has implant

## 2015-05-04 NOTE — Progress Notes (Signed)
BP 110/76 mmHg  Pulse 102  Temp(Src) 99.2 F (37.3 C)  Ht 5' 1.2" (1.554 m)  Wt 165 lb (74.844 kg)  BMI 30.99 kg/m2  SpO2 98%   Subjective:    Patient ID: Victoria Shepard, female    DOB: 1971-08-11, 44 y.o.   MRN: [id number]  HPI: Victoria Shepard is a 44 y.o. female  patient with STD concerns has HIV test drawn this morning which is being followed by Dr. Sanda Klein Patient with a great deal of anxiety which was reviewed this been worse over the last 2 weeks Reviewed ER notes and labs with no evidence of STDs after STD testing Seeing psychiatry for management of her anxiety and depression  Patient with marked stress-induced insomnia and wants something to help just short-term   Relevant past medical, surgical, family and social history reviewed and updated as indicated. Interim medical history since our last visit reviewed. Allergies and medications reviewed and updated.  Review of Systems  Constitutional: Positive for fatigue. Negative for fever.  Respiratory: Negative.   Cardiovascular: Negative.     Per HPI unless specifically indicated above     Objective:    BP 110/76 mmHg  Pulse 102  Temp(Src) 99.2 F (37.3 C)  Ht 5' 1.2" (1.554 m)  Wt 165 lb (74.844 kg)  BMI 30.99 kg/m2  SpO2 98%  Wt Readings from Last 3 Encounters:  05/04/15 165 lb (74.844 kg)  04/29/15 166 lb (75.297 kg)  02/17/15 172 lb (78.019 kg)    Physical Exam  Constitutional: She is oriented to person, place, and time. She appears well-developed and well-nourished. No distress.  HENT:  Head: Normocephalic and atraumatic.  Right Ear: Hearing normal.  Left Ear: Hearing normal.  Nose: Nose normal.  Eyes: Conjunctivae and lids are normal. Right eye exhibits no discharge. Left eye exhibits no discharge. No scleral icterus.  Neck: Normal range of motion. Neck supple. No thyromegaly present.  Cardiovascular: Normal rate, regular rhythm and normal heart sounds.   Pulmonary/Chest: Effort normal and  breath sounds normal. No respiratory distress.  Musculoskeletal: Normal range of motion. She exhibits no edema.  Lymphadenopathy:    She has no cervical adenopathy.  Neurological: [name] and oriented to person, place, and time.  Skin: Skin is intact. No rash noted.  Psychiatric: She has a normal mood and affect. Her speech is normal and behavior is normal. Judgment and thought content normal. Cognition and memory are normal.    Results for orders placed or performed in visit on 04/29/15  Strep Gp A Ag, IA W/Reflex  Result Value Ref Range   Strep A Culture Negative   CBC With Differential/Platelet  Result Value Ref Range   WBC 11.1 (H) 3.4 - 10.8 x10E3/uL   RBC 5.09 3.77 - 5.28 x10E6/uL   Hemoglobin 15.6 11.1 - 15.9 g/dL   Hematocrit 46.1 34.0 - 46.6 %   MCV 91 79 - 97 fL   MCH 30.6 26.6 - 33.0 pg   MCHC 33.8 31.5 - 35.7 g/dL   RDW 13.7 12.3 - 15.4 %   Platelets 383 (H) 150 - 379 x10E3/uL   Neutrophils 61 %   Lymphs 31 %   MID 8 %   Neutrophils Absolute 6.8 1.4 - 7.0 x10E3/uL   Lymphocytes Absolute 3.4 (H) 0.7 - 3.1 x10E3/uL   MID (Absolute) 0.9 0.1 - 1.6 X10E3/uL  Mononucleosis screen  Result Value Ref Range   Mono Screen Negative Negative  Magnesium  Result Value Ref  Range   Magnesium 2.2 1.6 - 2.3 mg/dL  Basic metabolic panel  Result Value Ref Range   Glucose 85 65 - 99 mg/dL   BUN 12 6 - 24 mg/dL   Creatinine, Ser 1.11 (H) 0.57 - 1.00 mg/dL   GFR calc non Af Amer 61 >59 mL/min/1.73   GFR calc Af Amer 70 >59 mL/min/1.73   BUN/Creatinine Ratio 11 9 - 23   Sodium 139 134 - 144 mmol/L   Potassium 3.9 3.5 - 5.2 mmol/L   Chloride 95 (L) 97 - 108 mmol/L   CO2 23 18 - 29 mmol/L   Calcium 10.0 8.7 - 10.2 mg/dL  HIV-1 RNA ultraquant reflex to gentyp+  Result Value Ref Range   HIV1 RNA # SerPl PCR <20 copies/mL   HIV1 RNA Plas PCR-Log# CANCELED log10copy/mL   Genotype Assay CANCELED   TSH  Result Value Ref Range   TSH 2.590 0.450 - 4.500 uIU/mL  ALT (SGPT)  Piccolo, Waived  Result Value Ref Range   ALT (SGPT) Piccolo, Waived 37 10 - 47 U/L  AST (SGOT) Piccolo, Waived  Result Value Ref Range   AST (SGOT) Piccolo, Waived 36 11 - 38 U/L      Assessment & Plan:   Problem List Items Addressed This Visit      Other   Anxiety - Primary    Pt with a lot of anxiety over STDs HIV pending Discuss and review Discuss birth control and pt has implant          discussed insomnia nerves and use of limited trazodone  Follow up plan: Return if symptoms worsen or fail to improve, for regular visit.

## 2015-05-05 LAB — HIV ANTIBODY (ROUTINE TESTING W REFLEX): HIV SCREEN 4TH GENERATION: NONREACTIVE

## 2015-05-23 ENCOUNTER — Encounter: Payer: Self-pay | Admitting: Family Medicine

## 2015-05-25 ENCOUNTER — Telehealth: Payer: Self-pay | Admitting: Family Medicine

## 2015-05-25 NOTE — Telephone Encounter (Signed)
Pt needs letter for dentist giving clearance for a dental procedure. Pt stated the letter needs to include the type of murmur she has. Please call pt when letter is ready for pick up. Thanks.

## 2015-06-21 DIAGNOSIS — F5104 Psychophysiologic insomnia: Secondary | ICD-10-CM | POA: Insufficient documentation

## 2015-06-24 ENCOUNTER — Other Ambulatory Visit: Payer: Self-pay | Admitting: Family Medicine

## 2015-06-24 DIAGNOSIS — Z1239 Encounter for other screening for malignant neoplasm of breast: Secondary | ICD-10-CM

## 2015-06-30 ENCOUNTER — Other Ambulatory Visit: Payer: Self-pay | Admitting: Family Medicine

## 2015-06-30 DIAGNOSIS — N6452 Nipple discharge: Secondary | ICD-10-CM

## 2015-06-30 DIAGNOSIS — Z803 Family history of malignant neoplasm of breast: Secondary | ICD-10-CM

## 2015-06-30 DIAGNOSIS — N6012 Diffuse cystic mastopathy of left breast: Secondary | ICD-10-CM

## 2015-06-30 DIAGNOSIS — N6011 Diffuse cystic mastopathy of right breast: Secondary | ICD-10-CM

## 2015-06-30 DIAGNOSIS — R928 Other abnormal and inconclusive findings on diagnostic imaging of breast: Secondary | ICD-10-CM

## 2015-07-02 ENCOUNTER — Other Ambulatory Visit: Payer: Self-pay | Admitting: Family Medicine

## 2015-07-04 ENCOUNTER — Encounter: Payer: Self-pay | Admitting: *Deleted

## 2015-07-04 ENCOUNTER — Ambulatory Visit: Payer: Medicaid Other

## 2015-07-08 ENCOUNTER — Ambulatory Visit
Admission: RE | Admit: 2015-07-08 | Discharge: 2015-07-08 | Disposition: A | Discharge status: Home / Self Care | Payer: Medicaid Other | Source: Ambulatory Visit | Attending: Family Medicine | Admitting: Family Medicine

## 2015-07-08 DIAGNOSIS — R928 Other abnormal and inconclusive findings on diagnostic imaging of breast: Secondary | ICD-10-CM

## 2015-07-08 DIAGNOSIS — N6452 Nipple discharge: Secondary | ICD-10-CM | POA: Insufficient documentation

## 2015-07-08 DIAGNOSIS — N6012 Diffuse cystic mastopathy of left breast: Secondary | ICD-10-CM

## 2015-07-08 DIAGNOSIS — Z803 Family history of malignant neoplasm of breast: Secondary | ICD-10-CM

## 2015-07-08 DIAGNOSIS — N6011 Diffuse cystic mastopathy of right breast: Secondary | ICD-10-CM

## 2015-07-12 ENCOUNTER — Encounter: Payer: Self-pay | Admitting: General Surgery

## 2015-07-13 ENCOUNTER — Ambulatory Visit (INDEPENDENT_AMBULATORY_CARE_PROVIDER_SITE_OTHER): Payer: Medicaid Other | Admitting: General Surgery

## 2015-07-13 ENCOUNTER — Encounter: Payer: Self-pay | Admitting: General Surgery

## 2015-07-13 VITALS — BP 132/76 | HR 82 | Resp 12 | Ht 61.0 in | Wt 172.0 lb

## 2015-07-13 DIAGNOSIS — N6452 Nipple discharge: Secondary | ICD-10-CM | POA: Diagnosis not present

## 2015-07-13 NOTE — Progress Notes (Signed)
Patient ID: Victoria Shepard, female   DOB: 1971-05-01, 44 y.o.   MRN: [id number]  Chief Complaint  Patient presents with  . Other    Bilateral breast discharge    HPI Victoria Shepard is a 44 y.o. female here today for an evaluation of bilateral breast bloody discharge. She first noticed the discharge on 06/26/15, it happened twice in one week and once yesterday. She woke up with green discharge on her night gown one morning. She does not manipluate her breast, she states it happens randomly. She said the discharge is from both right and left breast on day 1, bloody discharge from left breast on day 3 and bilateral clear drainage on day 17 after the first episode..  She denies any injury to her breasts and no fever. She had a mammogram and ultrasound done on 07/08/15. She has lost about 40 lbs since April.  She has 3 children, one girl and two boys. HPI  Past Medical History  Diagnosis Date  . Heart murmur   . Hypercholesteremia   . Anxiety   . Hypertension   . Depression   . OCD (obsessive compulsive disorder)   . Nexplanon in place     placed 10/11/14, remove 10/11/17  . History of abnormal mammogram 2010  . History of cellulitis     belly button  . Herpes     Past Surgical History  Procedure Laterality Date  . Liposuction  2012  . Mm ductogram bilat r/s  2010    due to bloody breast discharge  . Biopsy breast  04/2010    was normal  . Breast surgery Left 2011  . Hernia repair  2012  . Cosmetic surgery  2012  . Colonoscopy  2010    Family History  Problem Relation Age of Onset  . Hypertension Mother   . Hyperlipidemia Mother   . Diabetes Mother   . Heart disease Father     CABG  . Hypertension Father   . Mental illness Brother     anxiety  . Cancer Maternal Grandmother     lung  . Stroke Paternal Grandmother   . Stroke Paternal Grandfather     Social History Social History  Substance Use Topics  . Smoking status: Never Smoker   . Smokeless tobacco: Never  Used  . Alcohol Use: No    Allergies  Allergen Reactions  . Amoxicillin Anaphylaxis  . Penicillins Anaphylaxis  . Erythromycin Other (See Comments)    GI Upset  . Keflex [Cephalexin] Other (See Comments)    Yeast infection    Current Outpatient Prescriptions  Medication Sig Dispense Refill  . clonazePAM (KLONOPIN) 1 MG tablet Take 1 mg by mouth 2 (two) times daily as needed for anxiety.    Marland Kitchen etonogestrel (NEXPLANON) 68 MG IMPL implant Inject into the skin.    Marland Kitchen ibuprofen (ADVIL,MOTRIN) 800 MG tablet TAKE 1 TABLET 3 TIMES A DAY FOR 5 DAYS 60 tablet 1   No current facility-administered medications for this visit.    Review of Systems Review of Systems  Constitutional: Positive for fatigue.  Respiratory: Negative.   Cardiovascular: Negative.     Blood pressure 132/76, pulse 82, resp. rate 12, height 5\' 1"  (1.549 m), weight 172 lb (78.019 kg).  Physical Exam Physical Exam  Constitutional: She is oriented to person, place, and time. She appears well-developed and well-nourished.  HENT:  Mouth/Throat: Oropharynx is clear and moist.  Eyes: Conjunctivae are normal. No scleral icterus.  Neck: Neck  supple.  Cardiovascular: Normal rate and normal heart sounds.   Pulmonary/Chest: Effort normal and breath sounds normal. Right breast exhibits no inverted nipple, no mass, no nipple discharge, no skin change and no tenderness. Left breast exhibits no inverted nipple, no mass, no nipple discharge, no skin change and no tenderness. Breasts are asymmetrical (left half a cup larger larger than right ).    No nipple drainage could be elicited. No thickening of the nipple or areolar tissue appreciated.  Lymphadenopathy:    She has no cervical adenopathy.    She has no axillary adenopathy.       Right: No supraclavicular adenopathy present.       Left: No supraclavicular adenopathy present.  Neurological: [name] and oriented to person, place, and time.  Skin: Skin is warm and dry.     Data Reviewed Pathology from July 2011 showed no intraductal lesions. No rest hyperplasia.  PCP notes of 06/29/2015 reviewed.  Mammograms and ultrasound of 07/08/2015 reviewed. Near fatty replaced breast in spite of radiology report of heterogeneously dense breast. Ultrasound was negative. BI-RADS-2.    Assessment    Benign breast and mammogram/ultrasound exam.    Plan    The patient did not have mammographic or ultrasound abnormalities in 2011 when she underwent excision of the left retroareolar ductal structures, no pathologic process was identified. The patient reports serum prolactin and TSH levels have been checked with her PCP and were normal.  In light of the unremarkable mammograms and inability to reproduce her drainage by multiple practitioners at multiple dates, I see no indication for consideration of breast MRI and the potential for a successful ductogram is 0.  The patient was encouraged to cough there is any increase in frequency, which at present is very rare. Follow up otherwise will be on an as-needed basis.     Call with any new changes or concerns.    PCP: Lenn Cal 07/14/2015, 9:09 AM

## 2015-07-13 NOTE — Patient Instructions (Signed)
Call with any changes or concerns.

## 2015-07-14 DIAGNOSIS — N6452 Nipple discharge: Secondary | ICD-10-CM | POA: Insufficient documentation

## 2015-09-30 ENCOUNTER — Other Ambulatory Visit: Payer: Self-pay | Admitting: Family Medicine

## 2015-10-03 NOTE — Telephone Encounter (Signed)
E scribe did not work pleasecall in ibu [phone]

## 2015-11-22 ENCOUNTER — Ambulatory Visit (INDEPENDENT_AMBULATORY_CARE_PROVIDER_SITE_OTHER): Payer: Medicaid Other | Admitting: Family Medicine

## 2015-11-22 ENCOUNTER — Encounter: Payer: Self-pay | Admitting: Family Medicine

## 2015-11-22 VITALS — BP 109/77 | HR 105 | Temp 97.9°F | Ht 62.1 in | Wt 175.0 lb

## 2015-11-22 DIAGNOSIS — H9192 Unspecified hearing loss, left ear: Secondary | ICD-10-CM | POA: Diagnosis not present

## 2015-11-22 DIAGNOSIS — F32A Depression, unspecified: Secondary | ICD-10-CM

## 2015-11-22 DIAGNOSIS — F329 Major depressive disorder, single episode, unspecified: Secondary | ICD-10-CM | POA: Diagnosis not present

## 2015-11-22 MED ORDER — VALACYCLOVIR HCL 500 MG PO TABS: 500.0000 mg | ORAL_TABLET | Freq: Two times a day (BID) | ORAL | Status: DC

## 2015-11-22 MED ORDER — QUETIAPINE FUMARATE 100 MG PO TABS: 100.0000 mg | ORAL_TABLET | Freq: Every day | ORAL | Status: DC

## 2015-11-22 MED ORDER — CLONAZEPAM 1 MG PO TABS: 1.0000 mg | ORAL_TABLET | Freq: Two times a day (BID) | ORAL | Status: DC | PRN

## 2015-11-22 NOTE — Assessment & Plan Note (Signed)
Patient will be making consult with audiology if assistance needed from this office will provide.

## 2015-11-22 NOTE — Progress Notes (Signed)
   BP 109/77 mmHg  Pulse 105  Temp(Src) 97.9 F (36.6 C)  Ht 5' 2.1" (1.577 m)  Wt 175 lb (79.379 kg)  BMI 31.92 kg/m2  SpO2 99%   Subjective:    Patient ID: Victoria Shepard, female    DOB: 07-28-1971, 45 y.o.   MRN: [id number]  HPI: Victoria Shepard is a 45 y.o. female  Chief Complaint  Patient presents with  . Medication Refill   patient follow-up angst Society depression anxiety persists with intermittent anxiety flares takes Klonopin to a day sometimes will take a third. If so will only take one the next day. Depression is resolved and is getting married in one week. Still has marked anxiety problems. Patient's brother has been on Seroquel which is really helped a great deal. Patient tried Seroquel more than 10 years ago and had too much drowsiness but wants to try again. Discussed will not start medication prior to her wedding Also discussed waiting until settled into her marriage further  Patient also has some decreased hearing wants a referral sometime this spring to have her hearing checked.  Relevant past medical, surgical, family and social history reviewed and updated as indicated. Interim medical history since our last visit reviewed. Allergies and medications reviewed and updated.  Review of Systems  Constitutional: Negative.   Respiratory: Negative.   Cardiovascular: Negative.     Per HPI unless specifically indicated above     Objective:    BP 109/77 mmHg  Pulse 105  Temp(Src) 97.9 F (36.6 C)  Ht 5' 2.1" (1.577 m)  Wt 175 lb (79.379 kg)  BMI 31.92 kg/m2  SpO2 99%  Wt Readings from Last 3 Encounters:  11/22/15 175 lb (79.379 kg)  07/13/15 172 lb (78.019 kg)  05/04/15 165 lb (74.844 kg)    Physical Exam  Constitutional: She is oriented to person, place, and time. She appears well-developed and well-nourished. No distress.  HENT:  Head: Normocephalic and atraumatic.  Right Ear: Hearing normal.  Left Ear: Hearing normal.  Nose: Nose normal.   Eyes: Conjunctivae and lids are normal. Right eye exhibits no discharge. Left eye exhibits no discharge. No scleral icterus.  Cardiovascular: Normal rate, regular rhythm and normal heart sounds.   Pulmonary/Chest: Effort normal and breath sounds normal. No respiratory distress.  Musculoskeletal: Normal range of motion.  Neurological: [name] and oriented to person, place, and time.  Skin: Skin is intact. No rash noted.  Psychiatric: She has a normal mood and affect. Her speech is normal and behavior is normal. Judgment and thought content normal. Cognition and memory are normal.    Results for orders placed or performed in visit on 05/04/15  HIV antibody (with reflex)  Result Value Ref Range   HIV Screen 4th Generation wRfx Non Reactive Non Reactive      Assessment & Plan:   Problem List Items Addressed This Visit      Other   Hearing loss in left ear    Patient will be making consult with audiology if assistance needed from this office will provide.      Depression - Primary    For depression and anxiety will start Seroquel sometime this spring patient will make follow-up appointment approximately a month later Discussed drowsiness and plugs through it.           Follow up plan: Return for Physical Exam.

## 2015-11-22 NOTE — Assessment & Plan Note (Signed)
For depression and anxiety will start Seroquel sometime this spring patient will make follow-up appointment approximately a month later Discussed drowsiness and plugs through it.

## 2015-11-28 ENCOUNTER — Ambulatory Visit: Payer: Medicaid Other | Admitting: Family Medicine

## 2015-12-15 ENCOUNTER — Other Ambulatory Visit: Payer: Self-pay | Admitting: Otolaryngology

## 2015-12-15 DIAGNOSIS — H903 Sensorineural hearing loss, bilateral: Secondary | ICD-10-CM

## 2015-12-19 ENCOUNTER — Encounter: Payer: Self-pay | Admitting: Family Medicine

## 2015-12-19 ENCOUNTER — Ambulatory Visit (INDEPENDENT_AMBULATORY_CARE_PROVIDER_SITE_OTHER): Payer: Commercial Managed Care - PPO | Admitting: Family Medicine

## 2015-12-19 VITALS — BP 124/79 | HR 144 | Temp 98.8°F | Ht 61.7 in | Wt 174.0 lb

## 2015-12-19 DIAGNOSIS — N939 Abnormal uterine and vaginal bleeding, unspecified: Secondary | ICD-10-CM

## 2015-12-19 DIAGNOSIS — R300 Dysuria: Secondary | ICD-10-CM | POA: Diagnosis not present

## 2015-12-19 LAB — URINALYSIS, ROUTINE W REFLEX MICROSCOPIC
BILIRUBIN UA: NEGATIVE
Glucose, UA: NEGATIVE
Leukocytes, UA: NEGATIVE
NITRITE UA: NEGATIVE
Protein, UA: NEGATIVE
RBC, UA: NEGATIVE
SPEC GRAV UA: 1.025 (ref 1.005–1.030)
UUROB: 0.2 mg/dL (ref 0.2–1.0)
pH, UA: 5.5 (ref 5.0–7.5)

## 2015-12-19 NOTE — Progress Notes (Signed)
   BP 124/79 mmHg  Pulse 144  Temp(Src) 98.8 F (37.1 C)  Ht 5' 1.7" (1.567 m)  Wt 174 lb (78.926 kg)  BMI 32.14 kg/m2  SpO2 99%   Subjective:    Patient ID: Victoria Shepard, female    DOB: 06/25/1971, 45 y.o.   MRN: [id number]  HPI: Victoria Shepard is a 45 y.o. female  Chief Complaint  Patient presents with  . "brown" bloody vaginal discharge    x5-6 days, stopped yesterday   patient with vaginal discharge is noted above has just gotten married has a lot of personal life change going on area and patient's has contraceptive implant present for 2 years No other changes no frequency urgency has had mild dysuria no blood in urine With marriage patient has just become sexually active again. Has had some back pain no other changes.   Relevant past medical, surgical, family and social history reviewed and updated as indicated. Interim medical history since our last visit reviewed. Allergies and medications reviewed and updated.  Review of Systems  Constitutional: Negative.   Respiratory: Negative.   Cardiovascular: Negative.     Per HPI unless specifically indicated above     Objective:    BP 124/79 mmHg  Pulse 144  Temp(Src) 98.8 F (37.1 C)  Ht 5' 1.7" (1.567 m)  Wt 174 lb (78.926 kg)  BMI 32.14 kg/m2  SpO2 99%  Wt Readings from Last 3 Encounters:  12/19/15 174 lb (78.926 kg)  11/22/15 175 lb (79.379 kg)  07/13/15 172 lb (78.019 kg)    Physical Exam  Constitutional: She is oriented to person, place, and time. She appears well-developed and well-nourished. No distress.  HENT:  Head: Normocephalic and atraumatic.  Right Ear: Hearing normal.  Left Ear: Hearing normal.  Nose: Nose normal.  Eyes: Conjunctivae and lids are normal. Right eye exhibits no discharge. Left eye exhibits no discharge. No scleral icterus.  Cardiovascular: Normal rate, regular rhythm and normal heart sounds.   Pulmonary/Chest: Effort normal and breath sounds normal. No respiratory distress.   Abdominal: Soft. Bowel sounds are normal. She exhibits no distension and no mass. There is no tenderness. There is no rebound and no guarding.  Musculoskeletal: Normal range of motion.  Neurological: [name] and oriented to person, place, and time.  Skin: Skin is intact. No rash noted.  Psychiatric: She has a normal mood and affect. Her speech is normal and behavior is normal. Judgment and thought content normal. Cognition and memory are normal.    Results for orders placed or performed in visit on 05/04/15  HIV antibody (with reflex)  Result Value Ref Range   HIV Screen 4th Generation wRfx Non Reactive Non Reactive      Assessment & Plan:   Problem List Items Addressed This Visit    None    Visit Diagnoses    Dysuria    -  Primary    No evidence of UTI    Relevant Orders    Urinalysis, Routine w reflex microscopic (not at [facility])    Vaginal bleeding        Discuss Will observe vaginal bleeding most likely due to lifestyle changes will observe        Follow up plan: Return if symptoms worsen or fail to improve, for As scheduled.

## 2015-12-22 ENCOUNTER — Ambulatory Visit
Admission: RE | Admit: 2015-12-22 | Discharge: 2015-12-22 | Disposition: A | Discharge status: Home / Self Care | Payer: Commercial Managed Care - PPO | Source: Ambulatory Visit | Attending: Otolaryngology | Admitting: Otolaryngology

## 2015-12-22 DIAGNOSIS — H903 Sensorineural hearing loss, bilateral: Secondary | ICD-10-CM

## 2015-12-22 MED ORDER — GADOBENATE DIMEGLUMINE 529 MG/ML IV SOLN
20.0000 mL | Freq: Once | INTRAVENOUS | Status: AC | PRN
  Administered 2015-12-22: 16 mL via INTRAVENOUS

## 2016-02-06 ENCOUNTER — Ambulatory Visit (INDEPENDENT_AMBULATORY_CARE_PROVIDER_SITE_OTHER): Payer: Commercial Managed Care - PPO | Admitting: Family Medicine

## 2016-02-06 ENCOUNTER — Encounter: Payer: Self-pay | Admitting: Family Medicine

## 2016-02-06 VITALS — BP 112/82 | HR 101 | Temp 97.8°F | Ht 61.8 in | Wt 164.0 lb

## 2016-02-06 DIAGNOSIS — I10 Essential (primary) hypertension: Secondary | ICD-10-CM | POA: Diagnosis not present

## 2016-02-06 MED ORDER — MOMETASONE FUROATE 0.1 % EX CREA: 1.0000 "application " | TOPICAL_CREAM | Freq: Every day | CUTANEOUS | Status: DC

## 2016-02-06 MED ORDER — LINACLOTIDE 145 MCG PO CAPS: 145.0000 ug | ORAL_CAPSULE | Freq: Every day | ORAL | Status: DC

## 2016-02-06 NOTE — Progress Notes (Signed)
BP 112/82 mmHg  Pulse 101  Temp(Src) 97.8 F (36.6 C)  Ht 5' 1.8" (1.57 m)  Wt 164 lb (74.39 kg)  BMI 30.18 kg/m2  SpO2 94%   Subjective:    Patient ID: Victoria Shepard, female    DOB: 1971-01-12, 45 y.o.   MRN: [id number]  HPI: Victoria Shepard is a 45 y.o. female  Chief Complaint  Patient presents with  . Constipation  . Rash    neck, face, shoulders  . possible exercise induced asthma   Patient with long-term constipation's been able to use Doca locks okay with but just not helping much will stress something else   Has some nonspecific inflammation of her next been getting some treatment with some cream being put on for double chin has some rash on her shoulders also Patient concerned about coughing and some trouble with some wheezing when she starts to run.  Relevant past medical, surgical, family and social history reviewed and updated as indicated. Interim medical history since our last visit reviewed. Allergies and medications reviewed and updated.  Review of Systems  Constitutional: Negative.   Respiratory: Negative.   Cardiovascular: Negative.     Per HPI unless specifically indicated above     Objective:    BP 112/82 mmHg  Pulse 101  Temp(Src) 97.8 F (36.6 C)  Ht 5' 1.8" (1.57 m)  Wt 164 lb (74.39 kg)  BMI 30.18 kg/m2  SpO2 94%  Wt Readings from Last 3 Encounters:  02/06/16 164 lb (74.39 kg)  12/19/15 174 lb (78.926 kg)  11/22/15 175 lb (79.379 kg)    Physical Exam  Constitutional: She is oriented to person, place, and time. She appears well-developed and well-nourished. No distress.  HENT:  Head: Normocephalic and atraumatic.  Right Ear: Hearing normal.  Left Ear: Hearing normal.  Nose: Nose normal.  Eyes: Conjunctivae and lids are normal. Right eye exhibits no discharge. Left eye exhibits no discharge. No scleral icterus.  Cardiovascular: Normal rate, regular rhythm and normal heart sounds.   Pulmonary/Chest: Effort normal and breath sounds  normal. No respiratory distress.  Musculoskeletal: Normal range of motion.  Neurological: [name] and oriented to person, place, and time.  Skin: Skin is intact. No rash noted.  Psychiatric: She has a normal mood and affect. Her speech is normal and behavior is normal. Judgment and thought content normal. Cognition and memory are normal.    Results for orders placed or performed in visit on 12/19/15  Urinalysis, Routine w reflex microscopic (not at [facility])  Result Value Ref Range   Specific Gravity, UA 1.025 1.005 - 1.030   pH, UA 5.5 5.0 - 7.5   Color, UA Yellow Yellow   Appearance Ur Cloudy (A) Clear   Leukocytes, UA Negative Negative   Protein, UA Negative Negative/Trace   Glucose, UA Negative Negative   Ketones, UA Trace (A) Negative   RBC, UA Negative Negative   Bilirubin, UA Negative Negative   Urobilinogen, Ur 0.2 0.2 - 1.0 mg/dL   Nitrite, UA Negative Negative      Assessment & Plan:   Problem List Items Addressed This Visit      Cardiovascular and Mediastinum   Hypertension - Primary    The current medical regimen is effective;  continue present plan and medications.          Discuss constipation use of over-the-counter medications patient's tried will start with Linzess Discuss breathing gave sample of Brio to try patient took first dose here  in the office. Discuss rash more likely contact dermatitis will give Elocon cream Discuss weight loss healthy ways to do that cautions about using phentermine Note that no breast reduction surgery is recommended I would not do preop clearance  Follow up plan: Return in about 4 weeks (around 03/05/2016), or if symptoms worsen or fail to improve, for 1 month to follow-up medication.

## 2016-02-06 NOTE — Assessment & Plan Note (Signed)
The current medical regimen is effective;  continue present plan and medications.  

## 2016-02-22 ENCOUNTER — Telehealth: Payer: Self-pay | Admitting: Family Medicine

## 2016-02-22 MED ORDER — FLUTICASONE FUROATE-VILANTEROL 100-25 MCG/INH IN AEPB: 1.0000 | INHALATION_SPRAY | Freq: Every day | RESPIRATORY_TRACT | Status: DC

## 2016-02-22 NOTE — Telephone Encounter (Signed)
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 Wants to know if an RX can be sent to her pharmacy or if she can get another sample. [facility] [place]. Thanks.

## 2016-03-07 ENCOUNTER — Ambulatory Visit: Payer: Commercial Managed Care - PPO | Admitting: Family Medicine

## 2016-03-20 ENCOUNTER — Ambulatory Visit: Payer: Commercial Managed Care - PPO | Admitting: Family Medicine

## 2016-05-24 ENCOUNTER — Encounter: Payer: Medicaid Other | Admitting: Family Medicine

## 2016-05-28 ENCOUNTER — Other Ambulatory Visit: Payer: Self-pay | Admitting: Family Medicine

## 2016-05-28 NOTE — Telephone Encounter (Signed)
CPE 08/28/16, do you want to see her sooner?

## 2016-05-28 NOTE — Telephone Encounter (Signed)
Apt  And rx

## 2016-05-31 ENCOUNTER — Ambulatory Visit (INDEPENDENT_AMBULATORY_CARE_PROVIDER_SITE_OTHER): Payer: Commercial Managed Care - PPO | Admitting: Family Medicine

## 2016-05-31 ENCOUNTER — Encounter: Payer: Self-pay | Admitting: Family Medicine

## 2016-05-31 DIAGNOSIS — F419 Anxiety disorder, unspecified: Secondary | ICD-10-CM | POA: Diagnosis not present

## 2016-05-31 DIAGNOSIS — F32A Depression, unspecified: Secondary | ICD-10-CM

## 2016-05-31 DIAGNOSIS — F329 Major depressive disorder, single episode, unspecified: Secondary | ICD-10-CM

## 2016-05-31 MED ORDER — QUETIAPINE FUMARATE 100 MG PO TABS: 200.0000 mg | ORAL_TABLET | Freq: Every day | ORAL | 0 refills | Status: DC

## 2016-05-31 MED ORDER — CLONAZEPAM 1 MG PO TABS: 1.0000 mg | ORAL_TABLET | Freq: Every day | ORAL | 0 refills | Status: DC | PRN

## 2016-05-31 NOTE — Assessment & Plan Note (Signed)
Depression and anxiety will start Seroquel patient education given on side effects especially drowsiness will start 50 mg tonight 100 mg tomorrow then 200 mg

## 2016-05-31 NOTE — Progress Notes (Signed)
BP (!) 127/92 (BP Location: Left Arm, Patient Position: Sitting, Cuff Size: Normal)   Pulse 84   Ht 5' 2.3" (1.582 m)   Wt 176 lb (79.8 kg)   SpO2 97%   BMI 31.88 kg/m    Subjective:    Patient ID: Victoria Shepard, female    DOB: [birth year], 45 y.o.   MRN: [id number]  HPI: Victoria Shepard is a 45 y.o. female  Chief Complaint  Patient presents with  . Anxiety    eye twitching  Great deal of stress with in and of her marriage in a very rocky style. Patient unresponsive to antidepressants in the past is taking Seroquel which did help and her brother is on Seroquel which is helping a great deal. Patient has tried psychiatric therapy in the past with no real help. Has been sleeping taking Klonopin 2 tablets at bedtime  Relevant past medical, surgical, family and social history reviewed and updated as indicated. Interim medical history since our last visit reviewed. Allergies and medications reviewed and updated.  Review of Systems  Constitutional: Negative.   Respiratory: Negative.   Cardiovascular: Negative.     Per HPI unless specifically indicated above     Objective:    BP (!) 127/92 (BP Location: Left Arm, Patient Position: Sitting, Cuff Size: Normal)   Pulse 84   Ht 5' 2.3" (1.582 m)   Wt 176 lb (79.8 kg)   SpO2 97%   BMI 31.88 kg/m   Wt Readings from Last 3 Encounters:  05/31/16 176 lb (79.8 kg)  02/06/16 164 lb (74.4 kg)  12/19/15 174 lb (78.9 kg)    Physical Exam  Constitutional: She is oriented to person, place, and time. She appears well-developed and well-nourished. No distress.  HENT:  Head: Normocephalic and atraumatic.  Right Ear: Hearing normal.  Left Ear: Hearing normal.  Nose: Nose normal.  Eyes: Conjunctivae and lids are normal. Right eye exhibits no discharge. Left eye exhibits no discharge. No scleral icterus.  Cardiovascular: Normal rate, regular rhythm and normal heart sounds.   Pulmonary/Chest: Effort normal and breath sounds normal. No  respiratory distress.  Musculoskeletal: Normal range of motion.  Neurological: [name] and oriented to person, place, and time.  Skin: Skin is intact. No rash noted.  Psychiatric: She has a normal mood and affect. Her speech is normal and behavior is normal. Judgment and thought content normal. Cognition and memory are normal.    Results for orders placed or performed in visit on 12/19/15  Urinalysis, Routine w reflex microscopic (not at [facility])  Result Value Ref Range   Specific Gravity, UA 1.025 1.005 - 1.030   pH, UA 5.5 5.0 - 7.5   Color, UA Yellow Yellow   Appearance Ur Cloudy (A) Clear   Leukocytes, UA Negative Negative   Protein, UA Negative Negative/Trace   Glucose, UA Negative Negative   Ketones, UA Trace (A) Negative   RBC, UA Negative Negative   Bilirubin, UA Negative Negative   Urobilinogen, Ur 0.2 0.2 - 1.0 mg/dL   Nitrite, UA Negative Negative      Assessment & Plan:   Problem List Items Addressed This Visit      Other   Anxiety    Discuss anxiety risk and treatment will limit use of clonazepam gave her refill not to take more than 1 mg at a time and not for several days with Seroquel.      Depression    Depression and anxiety will start Seroquel  patient education given on side effects especially drowsiness will start 50 mg tonight 100 mg tomorrow then 200 mg       Other Visit Diagnoses   None.      Follow up plan: Return in about 2 weeks (around 06/14/2016), or if symptoms worsen or fail to improve, for Recheck anxiety depression medications.

## 2016-05-31 NOTE — Assessment & Plan Note (Signed)
Discuss anxiety risk and treatment will limit use of clonazepam gave her refill not to take more than 1 mg at a time and not for several days with Seroquel.

## 2016-06-04 ENCOUNTER — Telehealth: Payer: Self-pay | Admitting: Family Medicine

## 2016-06-04 NOTE — Telephone Encounter (Signed)
Phone call Discussed with patient circumflex will 50 mg with heart pounding discussed okay to go up 200 mg and 200 mg if problems call.

## 2016-06-04 NOTE — Telephone Encounter (Signed)
Pt called stated she is having really bad side effects with the new medication Dr. Jeananne Rama put her own. Stated she would like to go back to Celexa. Please call pt for more information. [facility] [place]. Pt stated Seroquel made her heart pound, felt like her heart was coming out of her chest. Thanks.

## 2016-06-13 NOTE — Telephone Encounter (Signed)
Spoke with pt and she stated that she is no longer taking the Seroquel and she is only taking the Klonopin. She only has 3 left. I explained that PCP was out of the office but I would still send a message.

## 2016-06-19 ENCOUNTER — Other Ambulatory Visit: Payer: Self-pay | Admitting: Family Medicine

## 2016-06-19 MED ORDER — CITALOPRAM HYDROBROMIDE 20 MG PO TABS: 20.0000 mg | ORAL_TABLET | Freq: Every day | ORAL | 3 refills | Status: DC

## 2016-06-19 NOTE — Progress Notes (Signed)
Phone call Discussed with patient hasn't been on clonazepam unable to take Seroquel to take citalopram's did better on that previously when she was taking that.

## 2016-06-19 NOTE — Telephone Encounter (Signed)
Call pt 

## 2016-06-20 ENCOUNTER — Ambulatory Visit: Payer: Commercial Managed Care - PPO | Admitting: Family Medicine

## 2016-06-24 IMAGING — MG MM DIAG BREAST TOMO BILATERAL
8 of 18 series · 8 of 40 positions shown · non-contrast
Comparison: Previous exam(s).

CLINICAL DATA: A 44-year-old female presenting for recent bilateral
spontaneous greenish nipple discharge approximately 2 weeks ago.
This was followed by 1 instance of unilateral left bloody nipple
discharge. Prolactin levels have been drawn and are normal. The
patient has remote history of left bloody nipple discharge. She had
surgery for this with benign pathology. The patient also has pain in
the inferior left breast.

EXAM:
DIGITAL DIAGNOSTIC BILATERAL MAMMOGRAM WITH 3D TOMOSYNTHESIS AND CAD
BILATERAL BREAST ULTRASOUND

[R XCCM]
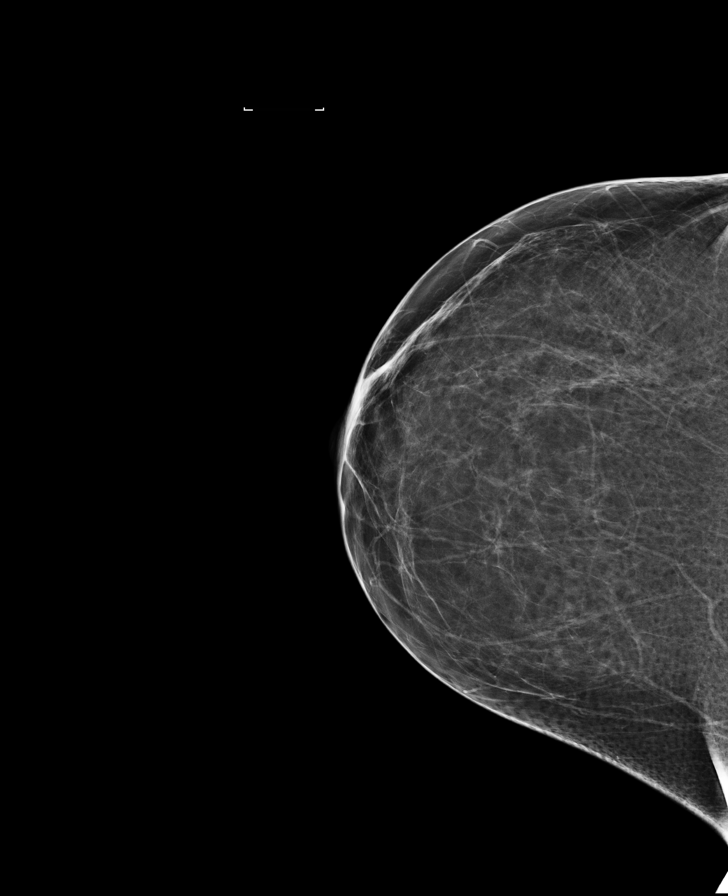

[R MLO]
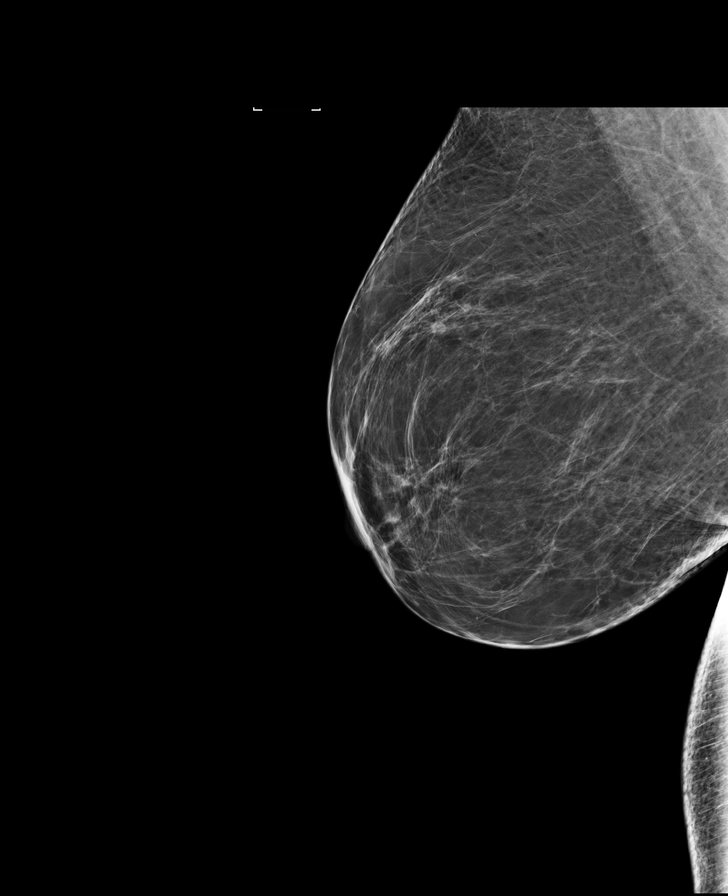

[L MLO]
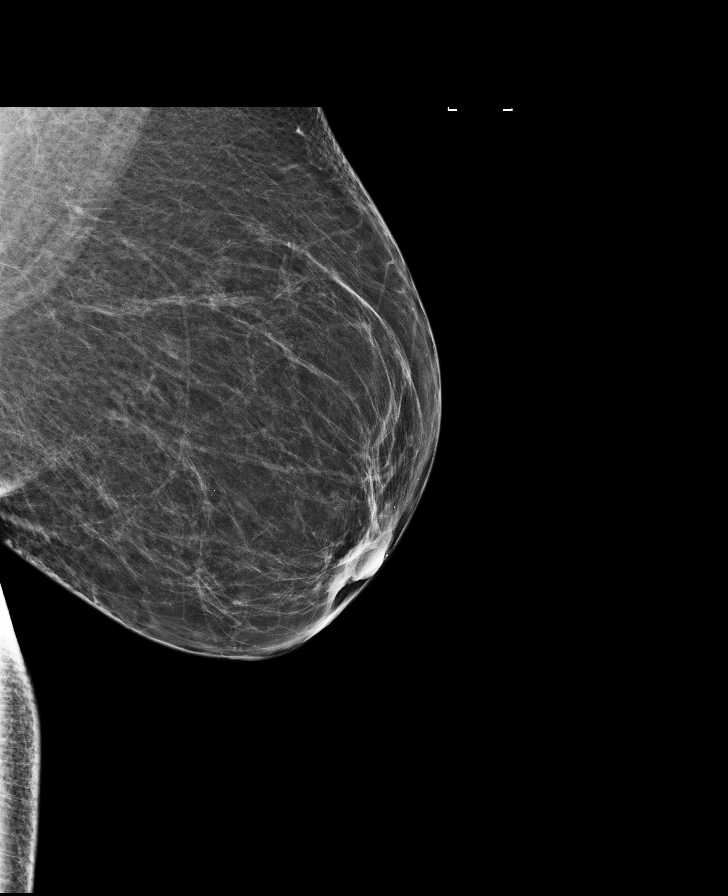

[L CC]
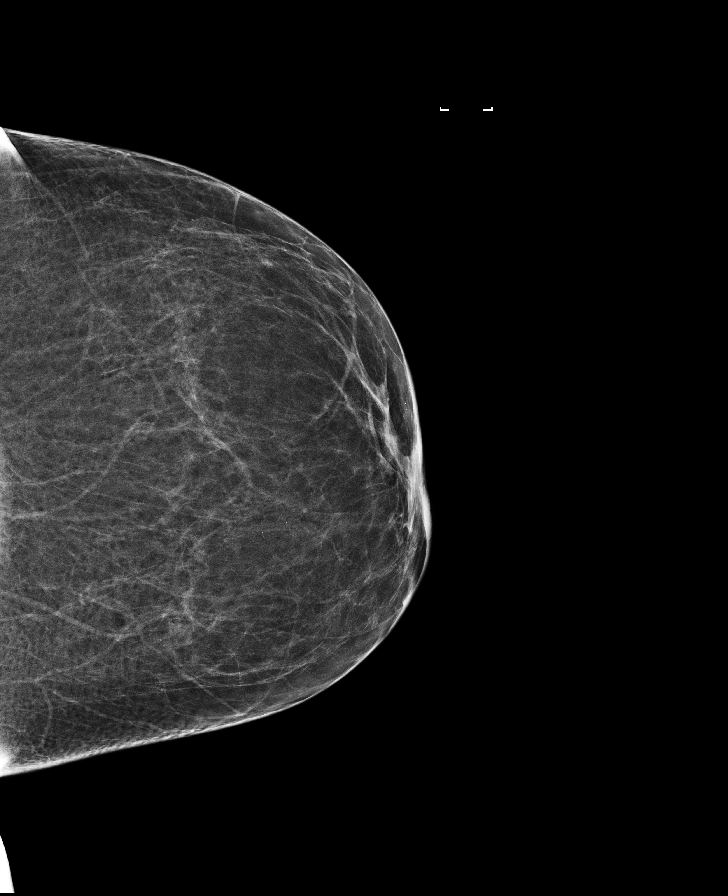

[L MLO synth-2D (1 of 2)]
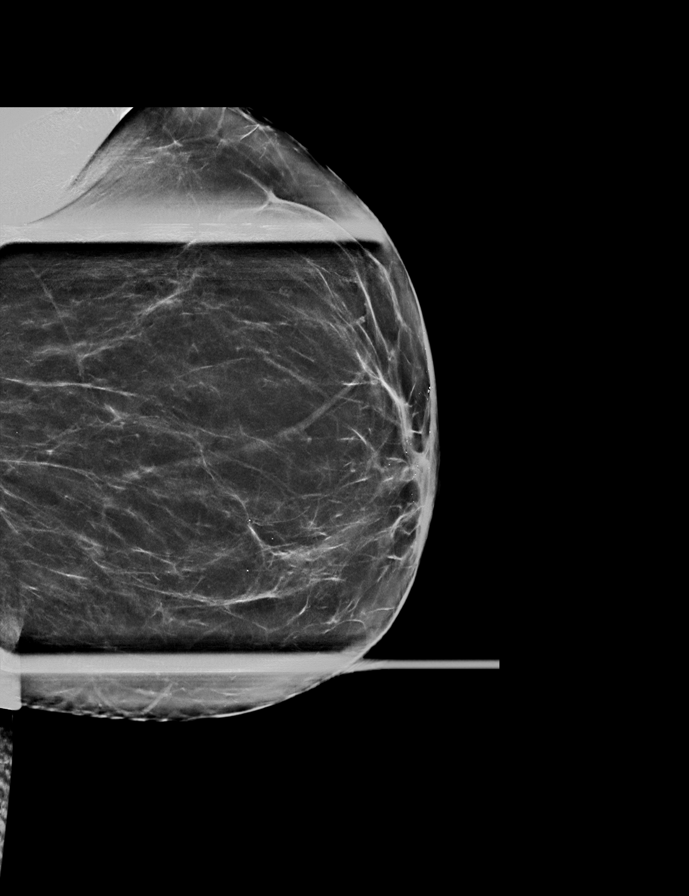

[L CC synth-2D]
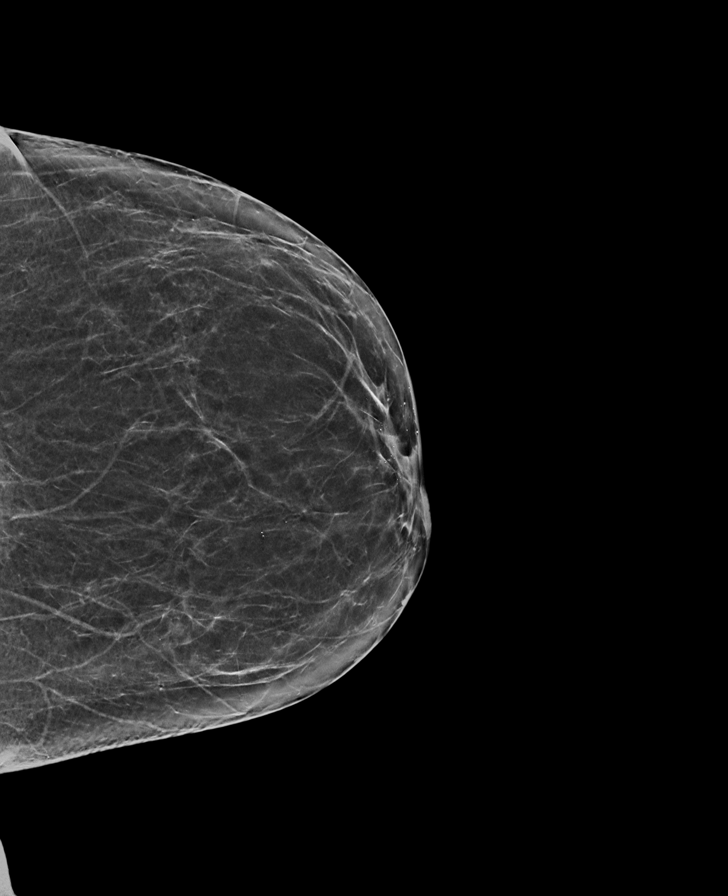

[R CC]
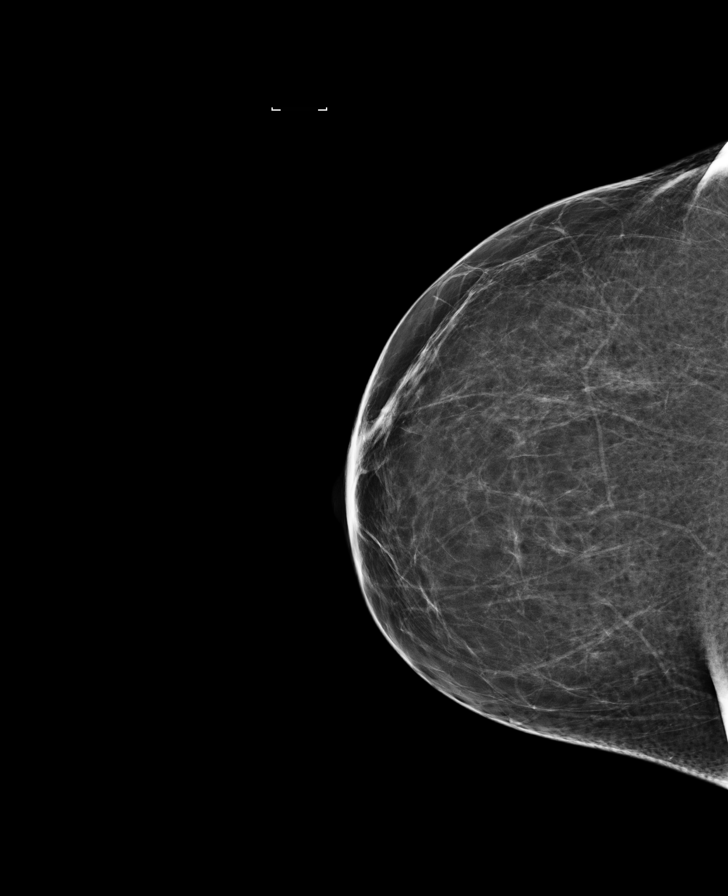

[L MLO synth-2D (2 of 2)]
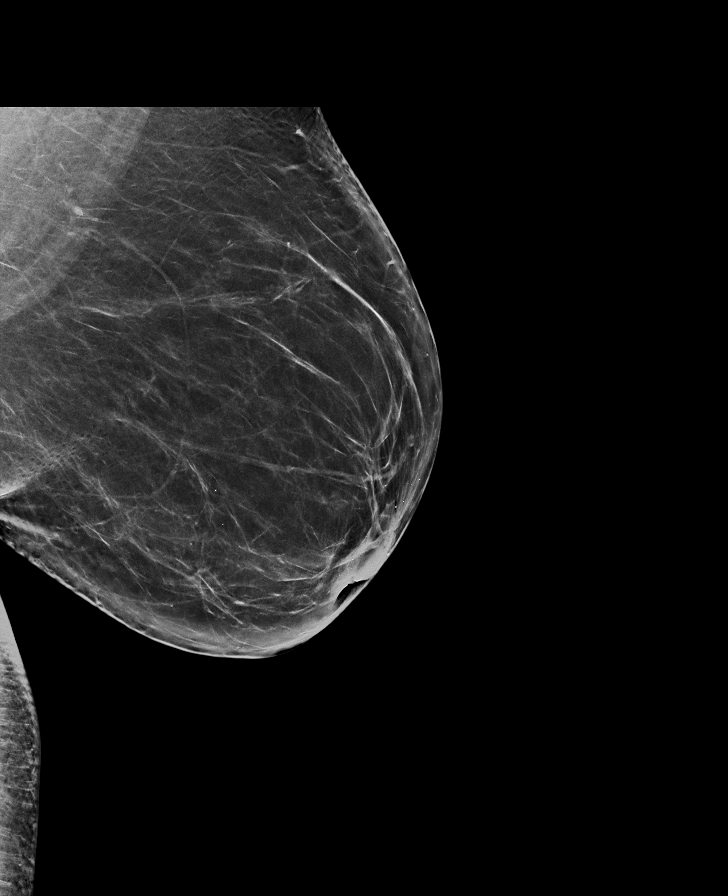

[8 of 40 positions shown; findings below may reference images not displayed]

ACR Breast Density Category c: The breast tissue is heterogeneously
dense, which may obscure small masses.
FINDINGS: No suspicious masses, calcifications or areas of distortion are seen
in the bilateral breasts. Benign calcifications are seen in the
subareolar left breast, likely from prior surgery.

Mammographic images were processed with CAD.

Physical exam of the bilateral breasts demonstrates normal
fibroglandular tissue without discrete palpable masses in the
subareolar bilateral breasts and in the inferior left breast at the
site of pain. No discharge was able to be elicited bilaterally on
exam.

Ultrasound of the subareolar right breast demonstrates a 4 mm
circumscribed anechoic mass compatible with a benign cyst. No
suspicious solid masses or areas of shadowing are identified.

Ultrasound of the subareolar left breast demonstrates normal
fibroglandular tissue without discrete mass or suspicious area of
shadowing. The inferior left breast was imaged from 4-8 o'clock at
the site of pain demonstrating normal fibroglandular tissue.
IMPRESSION: 1. No mammographic or targeted sonographic findings to explain
patient's bilateral greenish nipple discharge or left bloody nipple
discharge.

2. No mammographic or targeted sonographic evidence of malignancy in
the bilateral breasts.

RECOMMENDATION:
1. Clinical follow-up is recommended for the bilateral nipple
discharge. The patient states she has a surgical consultation
scheduled for further evaluation. If there is persistent/recurrent
nipple discharge, a ductogram may be performed. Additionally, MRI
may be useful if there is persistent bloody nipple discharge with no
mammographic or sonographic findings.

2.  Screening mammogram in one year.(Code:TS-2-LCV)

I have discussed the findings and recommendations with the patient.
Results were also provided in writing at the conclusion of the
visit. If applicable, a reminder letter will be sent to the patient
regarding the next appointment.

BI-RADS CATEGORY  2: Benign.

## 2016-07-14 ENCOUNTER — Other Ambulatory Visit: Payer: Self-pay | Admitting: Family Medicine

## 2016-07-16 NOTE — Telephone Encounter (Signed)
rx

## 2016-07-23 ENCOUNTER — Other Ambulatory Visit: Payer: Self-pay | Admitting: Family Medicine

## 2016-07-24 ENCOUNTER — Ambulatory Visit: Payer: Commercial Managed Care - PPO | Admitting: Family Medicine

## 2016-07-26 ENCOUNTER — Encounter: Payer: Self-pay | Admitting: Family Medicine

## 2016-07-26 ENCOUNTER — Ambulatory Visit (INDEPENDENT_AMBULATORY_CARE_PROVIDER_SITE_OTHER): Payer: Commercial Managed Care - PPO | Admitting: Family Medicine

## 2016-07-26 ENCOUNTER — Telehealth: Payer: Self-pay | Admitting: Family Medicine

## 2016-07-26 VITALS — BP 109/78 | HR 118 | Temp 98.5°F | Wt 170.0 lb

## 2016-07-26 DIAGNOSIS — R21 Rash and other nonspecific skin eruption: Secondary | ICD-10-CM

## 2016-07-26 DIAGNOSIS — R1904 Left lower quadrant abdominal swelling, mass and lump: Secondary | ICD-10-CM | POA: Diagnosis not present

## 2016-07-26 DIAGNOSIS — Z113 Encounter for screening for infections with a predominantly sexual mode of transmission: Secondary | ICD-10-CM | POA: Diagnosis not present

## 2016-07-26 MED ORDER — CLOBETASOL PROPIONATE 0.05 % EX OINT: 1.0000 "application " | TOPICAL_OINTMENT | Freq: Two times a day (BID) | CUTANEOUS | 0 refills | Status: DC

## 2016-07-26 NOTE — Telephone Encounter (Signed)
Pt called stating she had swelling and fluid retention in her abdomen. She said it was like the time she was sepsis but without the rash and fever. The patient made an appointment for today.

## 2016-07-26 NOTE — Progress Notes (Signed)
BP 109/78   Pulse (!) 118   Temp 98.5 F (36.9 C)   Wt 170 lb (77.1 kg)   SpO2 99%   BMI 30.79 kg/m    Subjective:    Patient ID: Victoria Shepard, female    DOB: May 06, 1971, 45 y.o.   MRN: [id number]  HPI: Victoria Shepard is a 45 y.o. female  Chief Complaint  Patient presents with  . Abdominal Swelling    x 3 days, took an injection in her abdomen for weight loss. Felt a knot/bubble at one of the sights, but rubbed it down. Still having swelling in her abdomen, some burning inside. But not painful.    Patient presents with 3 day history of local left abdominal swelling. States the day she noticed the issue, she had just given herself a B12 injection in that area. Did clean the area with alcohol prior to injecting. No fever, chills, erythema, or abdominal pain noted. Does admit to constipation, has been using extra laxatives lately since she has been cutting back calories and isn't going as frequently. States she has a hx of sepsis and is concerned that it is happening again.  Also notes that her husband cheated on her several months ago and she would like a full STD panel drawn. Has occasional dysuria, but no abnormal discharge, itching, odor, or hematuria.   Recently got a new puppy and now has itchy patches popping up over her body. They seem to go away in a day or so, but leaves scabs from her scratching so much. Has not tried anything OTC for the areas.   Past Medical History:  Diagnosis Date  . Anxiety   . Depression   . Heart murmur   . Herpes   . History of abnormal mammogram 2010  . History of cellulitis    belly button  . Hypercholesteremia   . Hypertension   . Nexplanon in place    placed 10/11/14, remove 10/11/17  . OCD (obsessive compulsive disorder)    Social History   Social History  . Marital status: Married    Spouse name: N/A  . Number of children: N/A  . Years of education: N/A   Occupational History  . Not on file.   Social History Main Topics    . Smoking status: Never Smoker  . Smokeless tobacco: Never Used  . Alcohol use No  . Drug use: No  . Sexual activity: Not on file   Other Topics Concern  . Not on file   Social History Narrative  . No narrative on file    Relevant past medical, surgical, family and social history reviewed and updated as indicated. Interim medical history since our last visit reviewed. Allergies and medications reviewed and updated.  Review of Systems  Constitutional: Negative.   HENT: Negative.   Respiratory: Negative.   Cardiovascular: Negative.   Gastrointestinal: Positive for constipation. Negative for diarrhea, nausea and vomiting.  Genitourinary: Positive for dysuria.  Musculoskeletal: Negative.   Skin: Positive for rash.  Neurological: Negative.   Psychiatric/Behavioral: Negative.     Per HPI unless specifically indicated above     Objective:    BP 109/78   Pulse (!) 118   Temp 98.5 F (36.9 C)   Wt 170 lb (77.1 kg)   SpO2 99%   BMI 30.79 kg/m   Wt Readings from Last 3 Encounters:  07/26/16 170 lb (77.1 kg)  05/31/16 176 lb (79.8 kg)  02/06/16 164 lb (74.4 kg)  Physical Exam  Constitutional: She is oriented to person, place, and time. She appears well-developed and well-nourished. No distress.  HENT:  Head: Atraumatic.  Eyes: Conjunctivae are normal. Pupils are equal, round, and reactive to light. No scleral icterus.  Neck: Normal range of motion. Neck supple.  Cardiovascular: Normal rate, regular rhythm and normal heart sounds.   Pulmonary/Chest: Effort normal. No respiratory distress.  Abdominal: Soft. Bowel sounds are normal. She exhibits no distension. There is no tenderness.  Area of localized superficial swelling on left side of abdomen around injection site. No evidence of ascites or abdominal distention  Musculoskeletal: Normal range of motion.  Lymphadenopathy:    She has no cervical adenopathy.  Neurological: [name] and oriented to person, place,  and time.  Skin: Skin is warm and dry. Rash (Sporadic patches of urticaria, some scabbed over from excessive itching) noted.  Psychiatric: She has a normal mood and affect. Her behavior is normal.  Nursing note and vitals reviewed.       Assessment & Plan:   Problem List Items Addressed This Visit    None    Visit Diagnoses    Abdominal swelling, LLQ    -  Primary   Suspect local reaction from B12 inj, but could also be from constipation or pressure from UTI so will check a U/A. Recommended ice/heat, and massage to the area   Relevant Orders   CBC with Differential/Platelet (Completed)   Comprehensive metabolic panel (Completed)   UA/M w/rflx Culture, Routine (Completed)   Screening for STD (sexually transmitted disease)       Await results   Relevant Orders   HIV antibody (Completed)   GC/Chlamydia Probe Amp   RPR (Completed)   HSV(herpes simplex vrs) 1+2 ab-IgG (Completed)   Rash       Suspect urticarial rash, possibly to new puppy's dander. Clobetasol ointment sent. Can take benadryl nightly.       Follow up plan: Return if symptoms worsen or fail to improve.

## 2016-07-27 ENCOUNTER — Telehealth: Payer: Self-pay | Admitting: Family Medicine

## 2016-07-27 LAB — COMPREHENSIVE METABOLIC PANEL
ALBUMIN: 4.7 g/dL (ref 3.5–5.5)
ALK PHOS: 67 IU/L (ref 39–117)
ALT: 22 IU/L (ref 0–32)
AST: 22 IU/L (ref 0–40)
Albumin/Globulin Ratio: 1.6 (ref 1.2–2.2)
BILIRUBIN TOTAL: 1.2 mg/dL (ref 0.0–1.2)
BUN / CREAT RATIO: 13 (ref 9–23)
BUN: 12 mg/dL (ref 6–24)
CHLORIDE: 98 mmol/L (ref 96–106)
CO2: 25 mmol/L (ref 18–29)
CREATININE: 0.91 mg/dL (ref 0.57–1.00)
Calcium: 10.1 mg/dL (ref 8.7–10.2)
GFR calc Af Amer: 89 mL/min/{1.73_m2} (ref >59)
GFR calc non Af Amer: 77 mL/min/{1.73_m2} (ref >59)
GLOBULIN, TOTAL: 3 g/dL (ref 1.5–4.5)
GLUCOSE: 105 mg/dL — AB (ref 65–99)
Potassium: 4.1 mmol/L (ref 3.5–5.2)
SODIUM: 142 mmol/L (ref 134–144)
Total Protein: 7.7 g/dL (ref 6.0–8.5)

## 2016-07-27 LAB — CBC WITH DIFFERENTIAL/PLATELET
BASOS ABS: 0 10*3/uL (ref 0.0–0.2)
Basos: 1 %
EOS (ABSOLUTE): 0.1 10*3/uL (ref 0.0–0.4)
Eos: 1 %
HEMATOCRIT: 43.7 % (ref 34.0–46.6)
Hemoglobin: 14.6 g/dL (ref 11.1–15.9)
Immature Grans (Abs): 0 10*3/uL (ref 0.0–0.1)
Immature Granulocytes: 0 %
LYMPHS ABS: 1.9 10*3/uL (ref 0.7–3.1)
Lymphs: 26 %
MCH: 30.1 pg (ref 26.6–33.0)
MCHC: 33.4 g/dL (ref 31.5–35.7)
MCV: 90 fL (ref 79–97)
MONOS ABS: 0.7 10*3/uL (ref 0.1–0.9)
Monocytes: 9 %
NEUTROS ABS: 4.4 10*3/uL (ref 1.4–7.0)
Neutrophils: 63 %
Platelets: 388 10*3/uL — ABNORMAL HIGH (ref 150–379)
RBC: 4.85 x10E6/uL (ref 3.77–5.28)
RDW: 14.2 % (ref 12.3–15.4)
WBC: 7.1 10*3/uL (ref 3.4–10.8)

## 2016-07-27 LAB — HSV(HERPES SIMPLEX VRS) I + II AB-IGG
HSV 1 Glycoprotein G Ab, IgG: <0.91 index (ref 0.00–0.90)
HSV 2 Glycoprotein G Ab, IgG: 5.53 index — ABNORMAL HIGH (ref 0.00–0.90)

## 2016-07-27 LAB — RPR: RPR: NONREACTIVE

## 2016-07-27 LAB — HIV ANTIBODY (ROUTINE TESTING W REFLEX): HIV SCREEN 4TH GENERATION: NONREACTIVE

## 2016-07-27 MED ORDER — DOXYCYCLINE HYCLATE 100 MG PO TABS: 100.0000 mg | ORAL_TABLET | Freq: Two times a day (BID) | ORAL | 0 refills | Status: DC

## 2016-07-27 MED ORDER — SULFAMETHOXAZOLE-TRIMETHOPRIM 800-160 MG PO TABS: 1.0000 | ORAL_TABLET | Freq: Two times a day (BID) | ORAL | 0 refills | Status: DC

## 2016-07-27 NOTE — Telephone Encounter (Signed)
Just spoke with her and gave her results

## 2016-07-27 NOTE — Telephone Encounter (Signed)
Left message for patient to return my call.

## 2016-07-27 NOTE — Telephone Encounter (Signed)
I will release them, but she is one I sent a phone message over about as there were abnormal results that required treatment so I like them to hear it from [place] before seeing it on mychart.

## 2016-07-27 NOTE — Telephone Encounter (Signed)
I got that message. Tried calling her and she did not answer. Left message to return my call.

## 2016-07-27 NOTE — Telephone Encounter (Signed)
Patient returned call to get lab results, discussed results with her. She states she forgot to tell us that she has a bad allergy to bactrim and asked me to call in something else for her UTI. Called in doxycycline. Discussed that we would let her know sometimes next week when the [facility] Chlamydia results came back.

## 2016-07-27 NOTE — Telephone Encounter (Signed)
Everything is in except for chlamydia/gonorrhea.  Release the ones that are in?

## 2016-07-27 NOTE — Telephone Encounter (Signed)
Please call pt and let her know that all of her labs came back normal, except her urine showed a UTI. I will send in bactrim to treat it.    As far as the STD screening, all negative so far other than the known HSV. Still waiting on GC Chlamydia.

## 2016-07-27 NOTE — Patient Instructions (Signed)
Follow up as needed

## 2016-07-27 NOTE — Telephone Encounter (Signed)
Pt called stated she wants to know the results of her labs from yesterday. Stated Apolonio Schneiders told her she would call as soon as they came in and release them in [place]. Please call ASAP. Thanks.

## 2016-07-28 LAB — MICROSCOPIC EXAMINATION

## 2016-07-28 LAB — GC/CHLAMYDIA PROBE AMP
CHLAMYDIA, DNA PROBE: NEGATIVE
NEISSERIA GONORRHOEAE BY PCR: NEGATIVE

## 2016-07-28 LAB — UA/M W/RFLX CULTURE, ROUTINE
BILIRUBIN UA: NEGATIVE
Glucose, UA: NEGATIVE
KETONES UA: NEGATIVE
Nitrite, UA: NEGATIVE
PH UA: 5.5 (ref 5.0–7.5)
Specific Gravity, UA: 1.03 (ref 1.005–1.030)
Urobilinogen, Ur: 1 mg/dL (ref 0.2–1.0)

## 2016-07-28 LAB — URINE CULTURE, REFLEX: Organism ID, Bacteria: NO GROWTH

## 2016-08-21 ENCOUNTER — Other Ambulatory Visit: Payer: Self-pay | Admitting: Family Medicine

## 2016-08-21 NOTE — Telephone Encounter (Signed)
Routing to provider  

## 2016-08-28 ENCOUNTER — Encounter: Payer: Commercial Managed Care - PPO | Admitting: Family Medicine

## 2016-09-11 ENCOUNTER — Other Ambulatory Visit: Payer: Self-pay | Admitting: Family Medicine

## 2016-09-12 ENCOUNTER — Other Ambulatory Visit: Payer: Self-pay | Admitting: Family Medicine

## 2016-09-17 ENCOUNTER — Other Ambulatory Visit: Payer: Self-pay | Admitting: Family Medicine

## 2016-09-17 NOTE — Telephone Encounter (Signed)
rx already filled.

## 2016-10-12 DIAGNOSIS — J309 Allergic rhinitis, unspecified: Secondary | ICD-10-CM | POA: Insufficient documentation

## 2016-10-12 DIAGNOSIS — G44219 Episodic tension-type headache, not intractable: Secondary | ICD-10-CM | POA: Insufficient documentation

## 2016-10-12 DIAGNOSIS — E669 Obesity, unspecified: Secondary | ICD-10-CM | POA: Insufficient documentation

## 2016-10-12 DIAGNOSIS — E66811 Obesity, class 1: Secondary | ICD-10-CM | POA: Insufficient documentation

## 2016-10-17 ENCOUNTER — Encounter: Payer: Commercial Managed Care - PPO | Admitting: Family Medicine

## 2016-10-22 ENCOUNTER — Other Ambulatory Visit: Payer: Self-pay | Admitting: Family Medicine

## 2016-10-22 DIAGNOSIS — Z1231 Encounter for screening mammogram for malignant neoplasm of breast: Secondary | ICD-10-CM

## 2016-10-25 ENCOUNTER — Encounter: Payer: Self-pay | Admitting: Family Medicine

## 2016-10-25 ENCOUNTER — Ambulatory Visit (INDEPENDENT_AMBULATORY_CARE_PROVIDER_SITE_OTHER): Payer: Commercial Managed Care - PPO | Admitting: Family Medicine

## 2016-10-25 VITALS — BP 136/80 | HR 108 | Temp 98.5°F | Ht 62.6 in | Wt 186.6 lb

## 2016-10-25 DIAGNOSIS — K582 Mixed irritable bowel syndrome: Secondary | ICD-10-CM | POA: Diagnosis not present

## 2016-10-25 DIAGNOSIS — Z0000 Encounter for general adult medical examination without abnormal findings: Secondary | ICD-10-CM | POA: Diagnosis not present

## 2016-10-25 DIAGNOSIS — K589 Irritable bowel syndrome without diarrhea: Secondary | ICD-10-CM | POA: Insufficient documentation

## 2016-10-25 DIAGNOSIS — Z0289 Encounter for other administrative examinations: Secondary | ICD-10-CM

## 2016-10-25 DIAGNOSIS — Z113 Encounter for screening for infections with a predominantly sexual mode of transmission: Secondary | ICD-10-CM | POA: Diagnosis not present

## 2016-10-25 DIAGNOSIS — R87610 Atypical squamous cells of undetermined significance on cytologic smear of cervix (ASC-US): Secondary | ICD-10-CM | POA: Diagnosis not present

## 2016-10-25 MED ORDER — CITALOPRAM HYDROBROMIDE 20 MG PO TABS: 20.0000 mg | ORAL_TABLET | Freq: Every day | ORAL | 2 refills | Status: DC

## 2016-10-25 MED ORDER — LINACLOTIDE 145 MCG PO CAPS: 145.0000 ug | ORAL_CAPSULE | Freq: Every day | ORAL | 4 refills | Status: DC

## 2016-10-25 MED ORDER — VALACYCLOVIR HCL 500 MG PO TABS: 500.0000 mg | ORAL_TABLET | Freq: Every day | ORAL | 4 refills | Status: AC

## 2016-10-25 MED ORDER — MOMETASONE FUROATE 0.1 % EX CREA: 1.0000 "application " | TOPICAL_CREAM | Freq: Every day | CUTANEOUS | 1 refills | Status: DC

## 2016-10-25 NOTE — Addendum Note (Signed)
Addended by: Gerda Diss A on: 10/25/2016 02:57 PM   Modules accepted: Orders

## 2016-10-25 NOTE — Addendum Note (Signed)
Addended byGolden Pop on: 10/25/2016 03:33 PM   Modules accepted: Orders

## 2016-10-25 NOTE — Addendum Note (Signed)
Addended byGolden Pop on: 10/25/2016 10:12 AM   Modules accepted: Orders

## 2016-10-25 NOTE — Progress Notes (Signed)
BP 136/80   Pulse (!) 108   Temp 98.5 F (36.9 C)   Ht 5' 2.6" (1.59 m)   Wt 186 lb 9.6 oz (84.6 kg)   BMI 33.48 kg/m    Subjective:    Patient ID: Victoria Shepard, female    DOB: 04/29/1971, 46 y.o.   MRN: [id number]  HPI: Victoria Shepard is a 46 y.o. female  Chief Complaint  Patient presents with  . Annual Exam  . STD Labs    pt states she would like STD labs done  . Menstrual Problem    pt states she is having problems with menstruation  . GI Problem    pt states she has been having stomach problems since December, states she has bloating and sees white spots in feces   . Labs Only    pt states she needs to have a TB screening done   Patient with multiple concerns we'll do STD testing patient with no symptoms Patient with concerns about possibility of parasites has irritable bowel symptoms from constipation predominant takes Lizness on a regular basis but his had possible wormlike particles in her stool with possible legs will check for parasites with stool specimens. 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 Patient has implant for birth control but had a menstrual period but this was during the minced of Christmas with multiple issues going on. Relevant past medical, surgical, family and social history reviewed and updated as indicated. Interim medical history since our last visit reviewed. Allergies and medications reviewed and updated.  Review of Systems  Constitutional: Negative.   HENT: Negative.   Eyes: Negative.   Respiratory: Negative.   Cardiovascular: Negative.   Gastrointestinal: Negative.   Endocrine: Negative.   Genitourinary: Negative.   Musculoskeletal: Negative.   Skin: Negative.   Allergic/Immunologic: Negative.   Neurological: Negative.   Hematological: Negative.   Psychiatric/Behavioral: Negative.     Per HPI unless specifically indicated above     Objective:    BP 136/80   Pulse (!) 108   Temp 98.5 F (36.9 C)   Ht 5' 2.6" (1.59  m)   Wt 186 lb 9.6 oz (84.6 kg)   BMI 33.48 kg/m   Wt Readings from Last 3 Encounters:  10/25/16 186 lb 9.6 oz (84.6 kg)  07/26/16 170 lb (77.1 kg)  05/31/16 176 lb (79.8 kg)    Physical Exam  Constitutional: She is oriented to person, place, and time. She appears well-developed and well-nourished.  HENT:  Head: Normocephalic and atraumatic.  Right Ear: External ear normal.  Left Ear: External ear normal.  Nose: Nose normal.  Mouth/Throat: Oropharynx is clear and moist.  Eyes: Conjunctivae and EOM are normal. Pupils are equal, round, and reactive to light.  Neck: Normal range of motion. Neck supple. Carotid bruit is not present.  Cardiovascular: Normal rate, regular rhythm and normal heart sounds.   No murmur heard. Pulmonary/Chest: Effort normal and breath sounds normal. She exhibits no mass. Right breast exhibits no mass, no skin change and no tenderness. Left breast exhibits no mass, no skin change and no tenderness. Breasts are symmetrical.  Abdominal: Soft. Bowel sounds are normal. There is no hepatosplenomegaly.  Genitourinary: Vagina normal and uterus normal.  Musculoskeletal: Normal range of motion.  Neurological: [name] and oriented to person, place, and time.  Skin: No rash noted.  Psychiatric: She has a normal mood and affect. Her behavior is normal. Judgment and thought content normal.    Results  for orders placed or performed in visit on 07/26/16  GC/Chlamydia Probe Amp  Result Value Ref Range   Chlamydia trachomatis, NAA Negative Negative   Neisseria gonorrhoeae by PCR Negative Negative  Microscopic Examination  Result Value Ref Range   WBC, UA 6-10 (A) 0 - 5 /hpf   RBC, UA 3-10 (A) 0 - 2 /hpf   Epithelial Cells (non renal) 0-10 0 - 10 /hpf   Bacteria, UA Moderate (A) None seen/Few  CBC with Differential/Platelet  Result Value Ref Range   WBC 7.1 3.4 - 10.8 x10E3/uL   RBC 4.85 3.77 - 5.28 x10E6/uL   Hemoglobin 14.6 11.1 - 15.9 g/dL   Hematocrit 43.7  34.0 - 46.6 %   MCV 90 79 - 97 fL   MCH 30.1 26.6 - 33.0 pg   MCHC 33.4 31.5 - 35.7 g/dL   RDW 14.2 12.3 - 15.4 %   Platelets 388 (H) 150 - 379 x10E3/uL   Neutrophils 63 Not Estab. %   Lymphs 26 Not Estab. %   Monocytes 9 Not Estab. %   Eos 1 Not Estab. %   Basos 1 Not Estab. %   Neutrophils Absolute 4.4 1.4 - 7.0 x10E3/uL   Lymphocytes Absolute 1.9 0.7 - 3.1 x10E3/uL   Monocytes Absolute 0.7 0.1 - 0.9 x10E3/uL   EOS (ABSOLUTE) 0.1 0.0 - 0.4 x10E3/uL   Basophils Absolute 0.0 0.0 - 0.2 x10E3/uL   Immature Granulocytes 0 Not Estab. %   Immature Grans (Abs) 0.0 0.0 - 0.1 x10E3/uL  Comprehensive metabolic panel  Result Value Ref Range   Glucose 105 (H) 65 - 99 mg/dL   BUN 12 6 - 24 mg/dL   Creatinine, Ser 0.91 0.57 - 1.00 mg/dL   GFR calc non Af Amer 77 >59 mL/min/1.73   GFR calc Af Amer 89 >59 mL/min/1.73   BUN/Creatinine Ratio 13 9 - 23   Sodium 142 134 - 144 mmol/L   Potassium 4.1 3.5 - 5.2 mmol/L   Chloride 98 96 - 106 mmol/L   CO2 25 18 - 29 mmol/L   Calcium 10.1 8.7 - 10.2 mg/dL   Total Protein 7.7 6.0 - 8.5 g/dL   Albumin 4.7 3.5 - 5.5 g/dL   Globulin, Total 3.0 1.5 - 4.5 g/dL   Albumin/Globulin Ratio 1.6 1.2 - 2.2   Bilirubin Total 1.2 0.0 - 1.2 mg/dL   Alkaline Phosphatase 67 39 - 117 IU/L   AST 22 0 - 40 IU/L   ALT 22 0 - 32 IU/L  UA/M w/rflx Culture, Routine  Result Value Ref Range   Specific Gravity, UA 1.030 1.005 - 1.030   pH, UA 5.5 5.0 - 7.5   Color, UA Yellow Yellow   Appearance Ur Clear Clear   Leukocytes, UA Trace (A) Negative   Protein, UA 1+ (A) Negative/Trace   Glucose, UA Negative Negative   Ketones, UA Negative Negative   RBC, UA 2+ (A) Negative   Bilirubin, UA Negative Negative   Urobilinogen, Ur 1.0 0.2 - 1.0 mg/dL   Nitrite, UA Negative Negative   Microscopic Examination See below:    Urinalysis Reflex Comment   HIV antibody  Result Value Ref Range   HIV Screen 4th Generation wRfx Non Reactive Non Reactive  RPR  Result Value Ref Range     RPR Ser Ql Non Reactive Non Reactive  HSV(herpes simplex vrs) 1+2 ab-IgG  Result Value Ref Range   HSV 1 Glycoprotein G Ab, IgG <0.91 0.00 - 0.90 index  HSV 2 Glycoprotein G Ab, IgG 5.53 (H) 0.00 - 0.90 index  Urine Culture, Routine  Result Value Ref Range   Urine Culture, Routine Final report    Urine Culture result 1 No growth       Assessment & Plan:   Problem List Items Addressed This Visit      Digestive   IBS (irritable bowel syndrome)    Also concerned about possible parasites and sleeps with her dog.      Relevant Medications   linaclotide (LINZESS) 145 MCG CAPS capsule     Other   Screen for sexually transmitted diseases    Patient reunited  with her husband after separation      Relevant Orders   GC/Chlamydia Probe Amp   HIV antibody   Hepatitis B core antibody, total   Hepatitis C antibody   RPR    Other Visit Diagnoses    Annual physical exam    -  Primary   Relevant Orders   UA/M w/rflx Culture, Routine   Comprehensive metabolic panel   Lipid panel   CBC with Differential/Platelet   TSH   Urinalysis, Routine w reflex microscopic   History and physical examination, occupation       Relevant Orders   Quantiferon tb gold assay (blood)       Follow up plan: Return in about 6 months (around 04/24/2017) for BMP.

## 2016-10-25 NOTE — Addendum Note (Signed)
Addended by: Gerda Diss A on: 10/25/2016 10:28 AM   Modules accepted: Orders

## 2016-10-25 NOTE — Assessment & Plan Note (Signed)
Patient reunited  with her husband after separation

## 2016-10-25 NOTE — Assessment & Plan Note (Signed)
Also concerned about possible parasites and sleeps with her dog.

## 2016-10-26 LAB — CBC WITH DIFFERENTIAL/PLATELET
BASOS ABS: 0.1 10*3/uL (ref 0.0–0.2)
BASOS: 1 %
EOS (ABSOLUTE): 0.2 10*3/uL (ref 0.0–0.4)
Eos: 2 %
Hematocrit: 42.7 % (ref 34.0–46.6)
Hemoglobin: 14.6 g/dL (ref 11.1–15.9)
IMMATURE GRANS (ABS): 0 10*3/uL (ref 0.0–0.1)
Immature Granulocytes: 0 %
LYMPHS: 29 %
Lymphocytes Absolute: 2.3 10*3/uL (ref 0.7–3.1)
MCH: 30.2 pg (ref 26.6–33.0)
MCHC: 34.2 g/dL (ref 31.5–35.7)
MCV: 88 fL (ref 79–97)
MONOS ABS: 0.6 10*3/uL (ref 0.1–0.9)
Monocytes: 8 %
NEUTROS ABS: 4.9 10*3/uL (ref 1.4–7.0)
NEUTROS PCT: 60 %
PLATELETS: 355 10*3/uL (ref 150–379)
RBC: 4.83 x10E6/uL (ref 3.77–5.28)
RDW: 13.1 % (ref 12.3–15.4)
WBC: 8 10*3/uL (ref 3.4–10.8)

## 2016-10-26 LAB — HEPATITIS C ANTIBODY: Hep C Virus Ab: <0.1 s/co ratio (ref 0.0–0.9)

## 2016-10-26 LAB — COMPREHENSIVE METABOLIC PANEL
ALBUMIN: 4.5 g/dL (ref 3.5–5.5)
ALT: 18 IU/L (ref 0–32)
AST: 16 IU/L (ref 0–40)
Albumin/Globulin Ratio: 1.6 (ref 1.2–2.2)
Alkaline Phosphatase: 68 IU/L (ref 39–117)
BUN / CREAT RATIO: 12 (ref 9–23)
BUN: 10 mg/dL (ref 6–24)
Bilirubin Total: 1.8 mg/dL — ABNORMAL HIGH (ref 0.0–1.2)
CALCIUM: 9.5 mg/dL (ref 8.7–10.2)
CO2: 23 mmol/L (ref 18–29)
CREATININE: 0.82 mg/dL (ref 0.57–1.00)
Chloride: 100 mmol/L (ref 96–106)
GFR calc Af Amer: 101 mL/min/{1.73_m2} (ref >59)
GFR, EST NON AFRICAN AMERICAN: 87 mL/min/{1.73_m2} (ref >59)
GLOBULIN, TOTAL: 2.9 g/dL (ref 1.5–4.5)
GLUCOSE: 103 mg/dL — AB (ref 65–99)
Potassium: 4.2 mmol/L (ref 3.5–5.2)
SODIUM: 140 mmol/L (ref 134–144)
Total Protein: 7.4 g/dL (ref 6.0–8.5)

## 2016-10-26 LAB — LIPID PANEL
CHOL/HDL RATIO: 4.8 ratio — AB (ref 0.0–4.4)
Cholesterol, Total: 207 mg/dL — ABNORMAL HIGH (ref 100–199)
HDL: 43 mg/dL (ref >39)
LDL Calculated: 135 mg/dL — ABNORMAL HIGH (ref 0–99)
Triglycerides: 144 mg/dL (ref 0–149)
VLDL CHOLESTEROL CAL: 29 mg/dL (ref 5–40)

## 2016-10-26 LAB — TSH: TSH: 2.71 u[IU]/mL (ref 0.450–4.500)

## 2016-10-26 LAB — RPR: RPR: NONREACTIVE

## 2016-10-26 LAB — HEPATITIS B CORE ANTIBODY, TOTAL: Hep B Core Total Ab: NEGATIVE

## 2016-10-26 LAB — HIV ANTIBODY (ROUTINE TESTING W REFLEX): HIV Screen 4th Generation wRfx: NONREACTIVE

## 2016-10-27 LAB — GC/CHLAMYDIA PROBE AMP
CHLAMYDIA, DNA PROBE: NEGATIVE
NEISSERIA GONORRHOEAE BY PCR: NEGATIVE

## 2016-10-29 ENCOUNTER — Telehealth: Payer: Self-pay | Admitting: Family Medicine

## 2016-10-29 ENCOUNTER — Encounter: Payer: Self-pay | Admitting: Family Medicine

## 2016-10-29 LAB — UA/M W/RFLX CULTURE, ROUTINE
Bilirubin, UA: NEGATIVE
Glucose, UA: NEGATIVE
Leukocytes, UA: NEGATIVE
NITRITE UA: NEGATIVE
PH UA: 5.5 (ref 5.0–7.5)
RBC UA: NEGATIVE
Specific Gravity, UA: >1.03 — ABNORMAL HIGH (ref 1.005–1.030)
Urobilinogen, Ur: 0.2 mg/dL (ref 0.2–1.0)

## 2016-10-29 LAB — MICROSCOPIC EXAMINATION

## 2016-10-29 LAB — QUANTIFERON IN TUBE
QUANTIFERON MITOGEN VALUE: >10 IU/mL
QUANTIFERON TB AG VALUE: 0.02 IU/mL
QUANTIFERON TB GOLD: NEGATIVE
Quantiferon Nil Value: 0.03 IU/mL

## 2016-10-29 LAB — QUANTIFERON TB GOLD ASSAY (BLOOD)

## 2016-10-29 LAB — URINE CULTURE, REFLEX

## 2016-10-29 NOTE — Telephone Encounter (Signed)
Spoke with patient. She was confused, thought that urine results were part of her TB results. Answered all questions to the best of my ability. TB results placed at front desk for pickup.

## 2016-10-29 NOTE — Telephone Encounter (Signed)
Patient is concerned about the results of TB test that Dr. Jeananne Rama sent to her this morning.  She also needs a copy sent to her if possible.  Thank You  Victoria Shepard

## 2016-10-30 ENCOUNTER — Telehealth: Payer: Self-pay | Admitting: Family Medicine

## 2016-10-30 LAB — IGP, APTIMA HPV, RFX 16/18,45
HPV APTIMA: POSITIVE — AB
PAP Smear Comment: 0

## 2016-10-30 LAB — OVA AND PARASITE EXAMINATION

## 2016-10-30 NOTE — Telephone Encounter (Signed)
Phone call discussed with patient Pap smear with atypical squamous cells will repeat in 3 months or so.

## 2016-11-16 ENCOUNTER — Ambulatory Visit
Admission: RE | Admit: 2016-11-16 | Discharge: 2016-11-16 | Disposition: A | Discharge status: Home / Self Care | Payer: Commercial Managed Care - PPO | Source: Ambulatory Visit | Attending: Family Medicine | Admitting: Family Medicine

## 2016-11-16 DIAGNOSIS — Z1231 Encounter for screening mammogram for malignant neoplasm of breast: Secondary | ICD-10-CM

## 2016-11-16 DIAGNOSIS — K5909 Other constipation: Secondary | ICD-10-CM | POA: Diagnosis not present

## 2016-11-16 DIAGNOSIS — R87610 Atypical squamous cells of undetermined significance on cytologic smear of cervix (ASC-US): Secondary | ICD-10-CM | POA: Diagnosis not present

## 2016-12-05 ENCOUNTER — Other Ambulatory Visit: Payer: Self-pay | Admitting: Family Medicine

## 2016-12-06 ENCOUNTER — Other Ambulatory Visit: Payer: Self-pay | Admitting: Family Medicine

## 2016-12-06 ENCOUNTER — Telehealth: Payer: Self-pay | Admitting: Family Medicine

## 2016-12-06 NOTE — Telephone Encounter (Signed)
Call pt 

## 2016-12-06 NOTE — Telephone Encounter (Signed)
Last refilled in November 2017.  Last routine OV: 10/25/16 Next OV: 02/07/17

## 2016-12-06 NOTE — Telephone Encounter (Signed)
Patient needs a script sent for her klonopin 1mg  to [facility]

## 2016-12-06 NOTE — Telephone Encounter (Signed)
Pt calling for Klonopin RX. Was denied 2 days ago. Please advise.

## 2016-12-11 MED ORDER — CLONAZEPAM 1 MG PO TABS: 1.0000 mg | ORAL_TABLET | Freq: Every day | ORAL | 1 refills | Status: DC | PRN

## 2017-01-04 ENCOUNTER — Telehealth: Payer: Self-pay | Admitting: Obstetrics & Gynecology

## 2017-01-04 ENCOUNTER — Encounter: Payer: Self-pay | Admitting: Obstetrics & Gynecology

## 2017-01-04 NOTE — Telephone Encounter (Signed)
Pt has been added to [facility]

## 2017-01-04 NOTE — Telephone Encounter (Signed)
Pt is calling do to needing to know if she needs to be seen more sooner than 02/04/17. Pt was schedule today for an referral. But was 20 mins late for her appt. Please advise if she needs to be worked in.

## 2017-02-04 ENCOUNTER — Encounter: Payer: Self-pay | Admitting: Obstetrics & Gynecology

## 2017-02-04 ENCOUNTER — Ambulatory Visit (INDEPENDENT_AMBULATORY_CARE_PROVIDER_SITE_OTHER): Payer: Commercial Managed Care - PPO | Admitting: Obstetrics & Gynecology

## 2017-02-04 VITALS — BP 120/80 | Ht 62.0 in | Wt 188.0 lb

## 2017-02-04 DIAGNOSIS — Z8619 Personal history of other infectious and parasitic diseases: Secondary | ICD-10-CM

## 2017-02-04 DIAGNOSIS — Z6834 Body mass index (BMI) 34.0-34.9, adult: Secondary | ICD-10-CM | POA: Diagnosis not present

## 2017-02-04 DIAGNOSIS — R102 Pelvic and perineal pain: Secondary | ICD-10-CM | POA: Diagnosis not present

## 2017-02-04 DIAGNOSIS — N926 Irregular menstruation, unspecified: Secondary | ICD-10-CM

## 2017-02-04 DIAGNOSIS — R635 Abnormal weight gain: Secondary | ICD-10-CM

## 2017-02-04 DIAGNOSIS — R87610 Atypical squamous cells of undetermined significance on cytologic smear of cervix (ASC-US): Secondary | ICD-10-CM

## 2017-02-04 DIAGNOSIS — R87810 Cervical high risk human papillomavirus (HPV) DNA test positive: Secondary | ICD-10-CM | POA: Diagnosis not present

## 2017-02-04 NOTE — Progress Notes (Signed)
Abdominal Pain Patient presents for evaluation of abdominal pain. The pain is described as aching and cramping, and is 5/10 in intensity. Pain is located in the deep pelvis area with radiation to the back. Onset was gradual occurring a few months ago. Symptoms have been unchanged since. Aggravating factors: none. Alleviating factors: none. Associated symptoms: none. The patient denies constipation, diarrhea, dysuria, fever, frequency, nausea and vomiting. Risk factors for pelvic/abdominal pain include none.  Additional Complaints: Dysfunctional Uterine Bleeding Patient complains of irregular menses. She had been bleeding infrequently. She recently had a bleeding episode of several painful days in Dec. She changes her pad or tampon every 2 hours. Clots are 1-5 cm in size. Dysmenorrhea:moderate, occurring throughout menses. Cyclic symptoms include: none. Current contraception: Nexplanon. History of infertility: no. History of abnormal Pap smear: yes - ASCUS, +HPV.   Also has recetn PAP of ASCUS w + HPV, never before.    Also has past history of HSV by blood test only (never had outbreak or lesion).  First husband died of pancreatic cancer. Second husband- separation- his infidelity.  PMHx: She  has a past medical history of Anxiety; Depression; Heart murmur; Herpes; History of abnormal mammogram (2010); History of cellulitis; Hypercholesteremia; Hypertension; Nexplanon in place; and OCD (obsessive compulsive disorder). Also,  has a past surgical history that includes Liposuction (2012); MM DUCTOGRAM BILAT R/S (2010); Biopsy breast (04/2010); Breast surgery (Left, 2011); Hernia repair (2012); Cosmetic surgery (2012); and Colonoscopy (2010)., family history includes Cancer in her maternal grandmother; Diabetes in her mother; Heart disease in her father; Hyperlipidemia in her mother; Hypertension in her father and mother; Mental illness in her brother; Stroke in her paternal grandfather and paternal  grandmother.,  reports that she has never smoked. She has never used smokeless tobacco. She reports that she does not drink alcohol or use drugs.  She has a current medication list which includes the following prescription(s): benzonatate, citalopram, clonazepam, doxycycline, etonogestrel, etonogestrel, fluticasone furoate-vilanterol, linaclotide, mometasone, phentermine, and valacyclovir. Also, is allergic to amoxicillin; penicillins; oseltamivir; bactrim [sulfamethoxazole-trimethoprim]; erythromycin; and keflex [cephalexin].  Review of Systems  Constitutional: Negative for chills, fever and malaise/fatigue.  HENT: Negative for congestion, sinus pain and sore throat.   Eyes: Negative for blurred vision and pain.  Respiratory: Negative for cough and wheezing.   Cardiovascular: Negative for chest pain and leg swelling.  Gastrointestinal: Negative for abdominal pain, constipation, diarrhea, heartburn, nausea and vomiting.  Genitourinary: Negative for dysuria, frequency, hematuria and urgency.  Musculoskeletal: Negative for back pain, joint pain, myalgias and neck pain.  Skin: Negative for itching and rash.  Neurological: Negative for dizziness, tremors and weakness.  Endo/Heme/Allergies: Does not bruise/bleed easily.  Psychiatric/Behavioral: Negative for depression. The patient is not nervous/anxious and does not have insomnia.     Objective: BP 120/80   Ht 5\' 2"  (1.575 m)   Wt 188 lb (85.3 kg)   LMP 10/06/2016   BMI 34.39 kg/m  Physical Exam  Constitutional: She is oriented to person, place, and time. She appears well-developed and well-nourished. No distress.  Genitourinary: Rectum normal, vagina normal and uterus normal. Pelvic exam was performed with patient supine. There is no rash or lesion on the right labia. There is no rash or lesion on the left labia. Vagina exhibits no lesion. No bleeding in the vagina. Right adnexum does not display mass and does not display tenderness. Left  adnexum does not display mass and does not display tenderness. Cervix does not exhibit motion tenderness, lesion, friability or polyp.  Uterus is mobile and midaxial. Uterus is not enlarged or exhibiting a mass.  HENT:  Head: Normocephalic and atraumatic. Head is without laceration.  Right Ear: Hearing normal.  Left Ear: Hearing normal.  Nose: No epistaxis.  No foreign bodies.  Mouth/Throat: Uvula is midline, oropharynx is clear and moist and mucous membranes are normal.  Eyes: Pupils are equal, round, and reactive to light.  Neck: Normal range of motion. Neck supple. No thyromegaly present.  Cardiovascular: Normal rate and regular rhythm.  Exam reveals no gallop and no friction rub.   No murmur heard. Pulmonary/Chest: Effort normal and breath sounds normal. No respiratory distress. She has no wheezes. Right breast exhibits no mass, no skin change and no tenderness. Left breast exhibits no mass, no skin change and no tenderness.  Abdominal: Soft. Bowel sounds are normal. She exhibits no distension. There is no tenderness. There is no rebound.  Musculoskeletal: Normal range of motion.  Neurological: [name] and oriented to person, place, and time. No cranial nerve deficit.  Skin: Skin is warm and dry.  Psychiatric: She has a normal mood and affect. Judgment normal.  Vitals reviewed.  ASSESSMENT/PLAN:    Problem List Items Addressed This Visit    None    Visit Diagnoses    Pelvic pain    -  Primary   Relevant Orders   US Transvaginal Non-OB   ASCUS with positive high risk HPV cervical       Irregular menses       Relevant Orders   US Transvaginal Non-OB   History of ELISA positive for HSV       Relevant Orders   HSV(herpes simplex vrs) 1+2 ab-IgG   Weight gain, abnormal       BMI 34.0-34.9,adult        Colpo scheduled for PAP evaluation Counseled as to weight loss therapies and strategies    Address next visit more  Barnett Applebaum, MD, [facility], [facility] 02/04/2017  12:06 PM

## 2017-02-04 NOTE — Patient Instructions (Signed)
Colposcopy Colposcopy is a procedure to examine the lowest part of the uterus (cervix), the vagina, and the area around the vaginal opening (vulva) for abnormalities or signs of disease. The procedure is done using a lighted microscope or magnifying lens (colposcope). If any unusual cells are found during the procedure, your health care provider may remove a tissue sample for testing (biopsy). A colposcopy may be done if you:  Have an abnormal Pap test. A Pap test is a screening test that is used to check for signs of cancer or infection of the vagina, cervix, and uterus.  Have a Pap smear test in which you test positive for high-risk HPV (human papillomavirus).  Have a sore or lesion on your cervix.  Have genital warts on your vulva, vagina, or cervix.  Took certain medicines while pregnant, such as diethylstilbestrol (DES).  Have pain during sexual intercourse.  Have vaginal bleeding, especially after sexual intercourse.  Need to have a cervical polyp removed.  Need to have a lost intrauterine device (IUD) string located. Let your health care provider know about:  Any allergies you have, including allergies to prescribed medicine, latex, or iodine.  All medicines you are taking, including vitamins, herbs, eye drops, creams, and over-the-counter medicines. Bring a list of all of your medicines to your appointment.  Any problems you or family members have had with anesthetic medicines.  Any blood disorders you have.  Any surgeries you have had.  Any medical conditions you have, such as pelvic inflammatory disease (PID) or endometrial disorder.  Any history of frequent fainting.  Your menstrual cycle and what form of birth control (contraception) you use.  Your medical history, including any prior cervical treatment.  Whether you are pregnant or may be pregnant. What are the risks? Generally, this is a safe procedure. However, problems may occur,  including:  Pain.  Infection, which may include a fever, bad-smelling discharge, or pelvic pain.  Bleeding or discharge.  Misdiagnosis.  Fainting and vasovagal reactions, but this is rare.  Allergic reactions to medicines.  Damage to other structures or organs. What happens before the procedure?  If you have your menstrual period or will have it at the time of your procedure, tell your health care provider. A colposcopy typically is not done during menstruation.  Continue your contraceptive practices before and after the procedure.  For 24 hours before the colposcopy:  Do not douche.  Do not use tampons.  Do not use medicines, creams, or suppositories in the vagina.  Do not have sexual intercourse.  Ask your health care provider about:  Changing or stopping your regular medicines. This is especially important if you are taking diabetes medicines or blood thinners.  Taking medicines such as aspirin and ibuprofen. These medicines can thin your blood. Do not take these medicines before your procedure if your health care provider instructs you not to. It is likely that your health care provider will tell you to avoid taking aspirin or medicine that contains aspirin for 7 days before the procedure.  Follow instructions from your health care provider about eating or drinking restrictions. You will likely need to eat a regular diet the day of the procedure and not skip any meals.  You may have an exam or testing. A pregnancy test will be taken on the day of the procedure.  You may have a blood or urine sample taken.  Plan to have someone take you home from the hospital or clinic.  If you will be going   home right after the procedure, plan to have someone with you for 24 hours. What happens during the procedure?  You will lie down on your back, with your feet in foot rests (stirrups).  A warmed and lubricated instrument (speculum) will be inserted into your vagina. The  speculum will be used to hold apart the walls of your vagina so your health care provider can see your cervix and the inside of your vagina.  A cotton swab will be used to place a small amount of liquid solution on the areas to be examined. This solution makes it easier to see abnormal cells. You may feel a slight burning during this part.  The colposcope will be used to scan the cervix with a bright white light. The colposcope will be held near your vulvaand will magnify your vulva, vagina, and cervix for easier examination.  Your health care provider may decide to take a biopsy. If so:  You may be given medicine to numb the area (local anesthetic).  Surgical instruments will be used to suck out mucus and cells through your vagina.  You may feel mild pain while the tissue sample is removed.  Bleeding may occur. A solution may be used to stop the bleeding.  If a sample of tissue is needed from the inside of the cervix, a different procedure called endocervical curettage (ECC) may be completed. During this procedure, a curved instrument (curette) will be used to scrape cells from your cervix or the top of your cervix (endocervix).  Your health care provider will record the location of any abnormalities. The procedure may vary among health care providers and hospitals. What happens after the procedure?  You will lie down and rest for a few minutes. You may be offered juice or cookies.  Your blood pressure, heart rate, breathing rate, and blood oxygen level will be monitored until any medicines you were given have worn off.  You may have to wear compression stockings. These stockings help to prevent blood clots and reduce swelling in your legs.  You may have some cramping in your abdomen. This should go away after a few minutes. This information is not intended to replace advice given to you by your health care provider. Make sure you discuss any questions you have with your health care  provider. Document Released: 12/22/2002 Document Revised: 05/29/2016 Document Reviewed: 05/07/2016 Elsevier Interactive Patient Education  2017 [employer]  

## 2017-02-05 LAB — HSV(HERPES SIMPLEX VRS) I + II AB-IGG: HSV 2 GLYCOPROTEIN G AB, IGG: 5.15 {index} — AB (ref 0.00–0.90)

## 2017-02-07 ENCOUNTER — Encounter: Payer: Commercial Managed Care - PPO | Admitting: Family Medicine

## 2017-02-15 ENCOUNTER — Other Ambulatory Visit: Payer: Self-pay | Admitting: Family Medicine

## 2017-02-18 NOTE — Telephone Encounter (Signed)
L Last routine OV: 10/25/16 Next OV: 04/25/17

## 2017-02-19 ENCOUNTER — Telehealth: Payer: Self-pay

## 2017-02-19 NOTE — Telephone Encounter (Signed)
D/w pt, she will keep appt next week as scheduled

## 2017-02-19 NOTE — Telephone Encounter (Signed)
Pt calling for results.  [phone].

## 2017-02-19 NOTE — Telephone Encounter (Signed)
Please advise, HSV positive?

## 2017-02-26 ENCOUNTER — Encounter: Payer: Self-pay | Admitting: Obstetrics & Gynecology

## 2017-02-26 ENCOUNTER — Ambulatory Visit (INDEPENDENT_AMBULATORY_CARE_PROVIDER_SITE_OTHER): Payer: Commercial Managed Care - PPO | Admitting: Obstetrics & Gynecology

## 2017-02-26 ENCOUNTER — Ambulatory Visit (INDEPENDENT_AMBULATORY_CARE_PROVIDER_SITE_OTHER): Payer: Commercial Managed Care - PPO

## 2017-02-26 VITALS — BP 120/80 | HR 82 | Ht 62.0 in | Wt 188.0 lb

## 2017-02-26 DIAGNOSIS — N926 Irregular menstruation, unspecified: Secondary | ICD-10-CM | POA: Diagnosis not present

## 2017-02-26 DIAGNOSIS — R102 Pelvic and perineal pain: Secondary | ICD-10-CM

## 2017-02-26 NOTE — Progress Notes (Signed)
  HPI: Follow up for pain, bloating, bleeding irregularly.  Pain is esp RLQ, and with [place] today severely painful during that side of exam.  Ultrasound demonstrates no masses seen These findings are Pelvis normal  PMHx: She  has a past medical history of Anxiety; Depression; Heart murmur; Herpes; History of abnormal mammogram (2010); History of cellulitis; Hypercholesteremia; Hypertension; Nexplanon in place; and OCD (obsessive compulsive disorder). Also,  has a past surgical history that includes Liposuction (2012); MM DUCTOGRAM BILAT R/S (2010); Biopsy breast (04/2010); Breast surgery (Left, 2011); Hernia repair (2012); Cosmetic surgery (2012); and Colonoscopy (2010)., family history includes Cancer in her maternal grandmother; Diabetes in her mother; Heart disease in her father; Hyperlipidemia in her mother; Hypertension in her father and mother; Mental illness in her brother; Stroke in her paternal grandfather and paternal grandmother.,  reports that she has never smoked. She has never used smokeless tobacco. She reports that she does not drink alcohol or use drugs.  She has a current medication list which includes the following prescription(s): benzonatate, citalopram, clonazepam, doxycycline, etonogestrel, etonogestrel, fluticasone furoate-vilanterol, linaclotide, mometasone, phentermine, and valacyclovir. Also, is allergic to amoxicillin; penicillins; oseltamivir; bactrim [sulfamethoxazole-trimethoprim]; erythromycin; and keflex [cephalexin].  Review of Systems  Constitutional: Negative for chills, fever and malaise/fatigue.  HENT: Negative for congestion, sinus pain and sore throat.   Eyes: Negative for blurred vision and pain.  Respiratory: Negative for cough and wheezing.   Cardiovascular: Negative for chest pain and leg swelling.  Gastrointestinal: Negative for abdominal pain, constipation, diarrhea, heartburn, nausea and vomiting.  Genitourinary: Negative for dysuria, frequency, hematuria  and urgency.  Musculoskeletal: Negative for back pain, joint pain, myalgias and neck pain.  Skin: Negative for itching and rash.  Neurological: Negative for dizziness, tremors and weakness.  Endo/Heme/Allergies: Does not bruise/bleed easily.  Psychiatric/Behavioral: Negative for depression. The patient is not nervous/anxious and does not have insomnia.     Objective: BP 120/80   Pulse 82   Ht 5\' 2"  (1.575 m)   Wt 188 lb (85.3 kg)   BMI 34.39 kg/m   Physical examination Constitutional NAD, Conversant  Skin No rashes, lesions or ulceration.   Extremities: Moves all appropriately.  Normal ROM for age. No lymphadenopathy.  Neuro: Grossly intact  Psych: Oriented to PPT.  Normal mood. Normal affect.   Assessment:  Pelvic pain in female Based on exam and sx's, options include dx lap or hysterectomy (possible RSO) to research and remove possible pain etiology, would also help with bleeding.  Could remove Nexplanon.  Also hormonal therapy an option.   To consider. Colpo next week.  Barnett Applebaum, MD, [facility], [facility] 02/26/2017  5:15 PM

## 2017-02-26 NOTE — Patient Instructions (Signed)
Total Laparoscopic Hysterectomy °A total laparoscopic hysterectomy is a minimally invasive surgery to remove your uterus and cervix. This surgery is performed by making several small cuts (incisions) in your abdomen. It can also be done with a thin, lighted tube (laparoscope) inserted into two small incisions in your lower abdomen. Your fallopian tubes and ovaries can be removed (bilateral salpingo-oophorectomy) during this surgery as well. Benefits of minimally invasive surgery include: °· Less pain. °· Less risk of blood loss. °· Less risk of infection. °· Quicker return to normal activities. ° °Tell a health care provider about: °· Any allergies you have. °· All medicines you are taking, including vitamins, herbs, eye drops, creams, and over-the-counter medicines. °· Any problems you or family members have had with anesthetic medicines. °· Any blood disorders you have. °· Any surgeries you have had. °· Any medical conditions you have. °What are the risks? °Generally, this is a safe procedure. However, as with any procedure, complications can occur. Possible complications include: °· Bleeding. °· Blood clots in the legs or lung. °· Infection. °· Injury to surrounding organs. °· Problems with anesthesia. °· Early menopause symptoms (hot flashes, night sweats, insomnia). °· Risk of conversion to an open abdominal incision. ° °What happens before the procedure? °· Ask your health care provider about changing or stopping your regular medicines. °· Do not take aspirin or blood thinners (anticoagulants) for 1 week before the surgery or as told by your health care provider. °· Do not eat or drink anything for 8 hours before the surgery or as told by your health care provider. °· Quit smoking if you smoke. °· Arrange for a ride home after surgery and for someone to help you at home during recovery. °What happens during the procedure? °· You will be given antibiotic medicine. °· An IV tube will be placed in your arm. You  will be given medicine to make you sleep (general anesthetic). °· A gas (carbon dioxide) will be used to inflate your abdomen. This will allow your surgeon to look inside your abdomen, perform your surgery, and treat any other problems found if necessary. °· Three or four small incisions (often less than 1/2 inch) will be made in your abdomen. One of these incisions will be made in the area of your belly button (navel). The laparoscope will be inserted into the incision. Your surgeon will look through the laparoscope while doing your procedure. °· Other surgical instruments will be inserted through the other incisions. °· Your uterus may be removed through your vagina or cut into small pieces and removed through the small incisions. °· Your incisions will be closed. °What happens after the procedure? °· The gas will be released from inside your abdomen. °· You will be taken to the recovery area where a nurse will watch and check your progress. Once you are awake, stable, and taking fluids well, without other problems, you will return to your room or be allowed to go home. °· There is usually minimal discomfort following the surgery because the incisions are so small. °· You will be given pain medicine while you are in the hospital and for when you go home. °This information is not intended to replace advice given to you by your health care provider. Make sure you discuss any questions you have with your health care provider. °Document Released: 07/29/2007 Document Revised: 03/08/2016 Document Reviewed: 04/21/2013 °Elsevier Interactive Patient Education © 2017 Elsevier Inc. ° °

## 2017-02-28 ENCOUNTER — Telehealth: Payer: Self-pay

## 2017-02-28 NOTE — Telephone Encounter (Signed)
Letter done, sent to you to print hopefully

## 2017-02-28 NOTE — Telephone Encounter (Signed)
Pt calling.  She left work today b/c she is hurting and not feeling good.  Wants work not for today and tomorrow so she can get some rest.  Will come by to p/u.  [phone]

## 2017-02-28 NOTE — Telephone Encounter (Signed)
Please advise 

## 2017-03-08 ENCOUNTER — Ambulatory Visit (INDEPENDENT_AMBULATORY_CARE_PROVIDER_SITE_OTHER): Payer: Commercial Managed Care - PPO | Admitting: Obstetrics & Gynecology

## 2017-03-08 ENCOUNTER — Encounter: Payer: Self-pay | Admitting: Obstetrics & Gynecology

## 2017-03-08 VITALS — BP 120/80 | HR 101 | Ht 62.0 in | Wt 188.0 lb

## 2017-03-08 DIAGNOSIS — R87610 Atypical squamous cells of undetermined significance on cytologic smear of cervix (ASC-US): Secondary | ICD-10-CM | POA: Diagnosis not present

## 2017-03-08 DIAGNOSIS — N72 Inflammatory disease of cervix uteri: Secondary | ICD-10-CM | POA: Diagnosis not present

## 2017-03-08 DIAGNOSIS — N870 Mild cervical dysplasia: Secondary | ICD-10-CM | POA: Diagnosis not present

## 2017-03-08 NOTE — Progress Notes (Signed)
Referring Provider:  self  HPI:  Victoria Shepard is a 46 y.o.  G3P3  who presents today for evaluation and management of abnormal cervical cytology.    Dysplasia History:  ASCUS in January  ROS:  Pertinent items noted in HPI and remainder of comprehensive ROS otherwise negative.  OB History  Gravida Para Term Preterm AB Living  3 3       3   SAB TAB Ectopic Multiple Live Births               # Outcome Date GA Lbr Len/2nd Weight Sex Delivery Anes PTL Lv  3 Para           2 Para           1 Para             Obstetric Comments  1st Menstrual Cycle:  14  1st Pregnancy:  20    Past Medical History:  Diagnosis Date  . Anxiety   . Depression   . Heart murmur   . Herpes   . History of abnormal mammogram 2010  . History of cellulitis    belly button  . Hypercholesteremia   . Hypertension   . Nexplanon in place    placed 10/11/14, remove 10/11/17  . OCD (obsessive compulsive disorder)     Past Surgical History:  Procedure Laterality Date  . BIOPSY BREAST  04/2010   was normal  . BREAST SURGERY Left 2011  . COLONOSCOPY  2010  . COSMETIC SURGERY  2012  . HERNIA REPAIR  2012  . LIPOSUCTION  2012  . MM DUCTOGRAM BILAT R/S  2010   due to bloody breast discharge    SOCIAL HISTORY: History  Alcohol Use No   History  Drug Use No     Family History  Problem Relation Age of Onset  . Hypertension Mother   . Hyperlipidemia Mother   . Diabetes Mother   . Heart disease Father        CABG  . Hypertension Father   . Mental illness Brother        anxiety  . Cancer Maternal Grandmother        lung  . Stroke Paternal Grandmother   . Stroke Paternal Grandfather     ALLERGIES:  Amoxicillin; Penicillins; Oseltamivir; Bactrim [sulfamethoxazole-trimethoprim]; Erythromycin; and Keflex [cephalexin]  Current Outpatient Prescriptions on File Prior to Visit  Medication Sig Dispense Refill  . benzonatate (TESSALON) 100 MG capsule TAKE 1-2 CAP(S) ORALLY 3 TIMES A DAY  0  .  citalopram (CELEXA) 20 MG tablet Take 1 tablet (20 mg total) by mouth daily. 90 tablet 2  . clonazePAM (KLONOPIN) 1 MG tablet TAKE 1 TABLET BY MOUTH EVERY DAY AS NEEDED FOR ANXIETY 30 tablet 1  . doxycycline (VIBRA-TABS) 100 MG tablet TAKE 1 TAB(S) 2 TIMES A DAY  0  . etonogestrel (NEXPLANON) 68 MG IMPL implant Inject into the skin.    Marland Kitchen etonogestrel (NEXPLANON) 68 MG IMPL implant Inject into the skin.    . fluticasone furoate-vilanterol (BREO ELLIPTA) 100-25 MCG/INH AEPB Inhale 1 puff into the lungs daily. 1 each 12  . linaclotide (LINZESS) 145 MCG CAPS capsule Take 1 capsule (145 mcg total) by mouth daily before breakfast. 90 capsule 4  . mometasone (ELOCON) 0.1 % cream Apply 1 application topically daily. 45 g 1  . phentermine 37.5 MG capsule Take 37.5 mg by mouth every morning.    . valACYclovir (VALTREX) 500 MG tablet Take 1  tablet (500 mg total) by mouth daily. 90 tablet 4   No current facility-administered medications on file prior to visit.     Physical Exam: -Vitals:  BP 120/80   Pulse (!) 101   Ht 5\' 2"  (1.575 m)   Wt 188 lb (85.3 kg)   LMP 02/28/2017   BMI 34.39 kg/m  GEN: WD, WN, NAD.  A+ O x 3, good mood and affect. ABD:  NT, ND.  Soft, no masses.  No hernias noted.   Pelvic:   Vulva: Normal appearance.  No lesions.  Vagina: No lesions or abnormalities noted.  Support: Normal pelvic support.  Urethra No masses tenderness or scarring.  Meatus Normal size without lesions or prolapse.  Cervix: See below.  Anus: Normal exam.  No lesions.  Perineum: Normal exam.  No lesions.        Bimanual   Uterus: Normal size.  Non-tender.  Mobile.  AV.  Adnexae: No masses.  Non-tender to palpation.  Cul-de-sac: Negative for abnormality.   PROCEDURE: 1.  Urine Pregnancy Test:  not done 2.  Colposcopy performed with 4% acetic acid after verbal consent obtained                                         -Aceto-white Lesions Location(s): diffuse at os.              -Biopsy performed  at 6 and 12 o'clock               -ECC indicated and performed: Yes.       -Biopsy sites made hemostatic with pressure, AgNO3, and/or Monsel's solution   -Satisfactory colposcopy: Yes.      -Evidence of Invasive cervical CA :  NO  ASSESSMENT:  Victoria Shepard is a 46 y.o. G3P3 here for  1. Pap smear abnormality of cervix with ASCUS favoring dysplasia   .  PLAN: 1.  I discussed the grading system of pap smears and HPV high risk viral types.  We will discuss and base management after colpo results return. 2. Follow up PAP 6 months, vs intervention if high grade dysplasia identified 3. Treatment of persistantly abnormal PAP smears and cervical dysplasia, even mild, is discussed w pt today in detail, as well as the pros and cons of Cryo and LEEP procedures. Will consider and discuss after results.  Considering hyst for pelvic pain (overnight stay due to hx sepsis).      Barnett Applebaum, MD, [facility], [facility] 03/08/2017  3:04 PM

## 2017-03-08 NOTE — Patient Instructions (Signed)

## 2017-03-13 LAB — PATHOLOGY

## 2017-03-14 NOTE — Progress Notes (Signed)
D/w pt.  Reassured.  F/u PAP every 6 mos, unless hyst for pain.

## 2017-03-18 ENCOUNTER — Telehealth: Payer: Self-pay

## 2017-03-18 NOTE — Telephone Encounter (Signed)
Pt calling - states PH said he could help her c wt loss to help c bak pain before she has surgery.  Would like more details.  [phone]

## 2017-03-18 NOTE — Telephone Encounter (Signed)
Do you want pt to come in for weight loss conference?

## 2017-03-18 NOTE — Telephone Encounter (Signed)
Can you call pt and schedule

## 2017-03-18 NOTE — Telephone Encounter (Signed)
Scheduled

## 2017-03-18 NOTE — Telephone Encounter (Signed)
That would be advisable.  Thx

## 2017-03-20 ENCOUNTER — Ambulatory Visit (INDEPENDENT_AMBULATORY_CARE_PROVIDER_SITE_OTHER): Payer: Commercial Managed Care - PPO | Admitting: Obstetrics & Gynecology

## 2017-03-20 ENCOUNTER — Encounter: Payer: Self-pay | Admitting: Obstetrics & Gynecology

## 2017-03-20 VITALS — BP 102/70 | HR 76 | Ht 62.0 in | Wt 190.0 lb

## 2017-03-20 DIAGNOSIS — Z202 Contact with and (suspected) exposure to infections with a predominantly sexual mode of transmission: Secondary | ICD-10-CM | POA: Diagnosis not present

## 2017-03-20 DIAGNOSIS — E669 Obesity, unspecified: Secondary | ICD-10-CM

## 2017-03-20 DIAGNOSIS — E66811 Obesity, class 1: Secondary | ICD-10-CM

## 2017-03-20 MED ORDER — PHENTERMINE HCL 37.5 MG PO TABS: 37.5000 mg | ORAL_TABLET | Freq: Every day | ORAL | 0 refills | Status: DC

## 2017-03-20 NOTE — Patient Instructions (Signed)

## 2017-03-20 NOTE — Progress Notes (Signed)
HPI:  Patient is a 46 y.o. G3P3 presenting for evaluation of abnormal weight gain.  The patient has gained 30 pounds over the past 2 years..  She feels she has gained weight due to problems with her physical activity.  She has these associated symptoms: none.  She has tried prescription appetite suppressants: (Phentermine in past) and self-directed dieting in the past with some success. PMHx: She  has a past medical history of Anxiety; Depression; Heart murmur; Herpes; History of abnormal mammogram (2010); History of cellulitis; Hypercholesteremia; Hypertension; Nexplanon in place; and OCD (obsessive compulsive disorder). Also,  has a past surgical history that includes Liposuction (2012); MM DUCTOGRAM BILAT R/S (2010); Biopsy breast (04/2010); Breast surgery (Left, 2011); Hernia repair (2012); Cosmetic surgery (2012); and Colonoscopy (2010)., family history includes Cancer in her maternal grandmother; Diabetes in her mother; Heart disease in her father; Hyperlipidemia in her mother; Hypertension in her father and mother; Mental illness in her brother; Stroke in her paternal grandfather and paternal grandmother.,  reports that she has never smoked. She has never used smokeless tobacco. She reports that she does not drink alcohol or use drugs.  She has a current medication list which includes the following prescription(s): benzonatate, citalopram, clonazepam, doxycycline, etonogestrel, etonogestrel, fluticasone furoate-vilanterol, linaclotide, mometasone, phentermine, and valacyclovir. Also, is allergic to amoxicillin; penicillins; oseltamivir; bactrim [sulfamethoxazole-trimethoprim]; erythromycin; and keflex [cephalexin].  Review of Systems  Constitutional: Negative for chills, fever and malaise/fatigue.  HENT: Negative for congestion, sinus pain and sore throat.   Eyes: Negative for blurred vision and pain.  Respiratory: Negative for cough and wheezing.   Cardiovascular: Negative for chest pain and leg  swelling.  Gastrointestinal: Negative for abdominal pain, constipation, diarrhea, heartburn, nausea and vomiting.  Genitourinary: Negative for dysuria, frequency, hematuria and urgency.  Musculoskeletal: Negative for back pain, joint pain, myalgias and neck pain.  Skin: Negative for itching and rash.  Neurological: Negative for dizziness, tremors and weakness.  Endo/Heme/Allergies: Does not bruise/bleed easily.  Psychiatric/Behavioral: Negative for depression. The patient is not nervous/anxious and does not have insomnia.     Objective: BP 102/70   Pulse 76   Ht 5\' 2"  (1.575 m)   Wt 190 lb (86.2 kg)   LMP 02/28/2017   BMI 34.75 kg/m   Filed Weights   03/20/17 0840  Weight: 190 lb (86.2 kg)    Physical Exam  Constitutional: She is oriented to person, place, and time. She appears well-developed and well-nourished. No distress.  Musculoskeletal: Normal range of motion.  Neurological: [name] and oriented to person, place, and time.  Skin: Skin is warm and dry.  Psychiatric: She has a normal mood and affect.  Vitals reviewed.   ASSESSMENT:  obesity BMI 34  Plan: Will assist patient in incorporating positive experiences into her life to promote a positive mental attitude.  Education given regarding appropriate lifestyle changes for weight loss, including regular physical activity, healthy coping strategies, caloric restriction, and healthy eating patterns.  Patient is started on prescription appetite suppressants: Phentermine.    The risks and benefits as well as side effects of medication, such as Phenteramine or Tenuate, is discussed.  The pros and cons of suppressing appetite and boosting metabolism is counseled.  Risks of tolerance and addiction discussed.  Use of medicine will be short term.  Pt to call with any negative side effects and agrees to keep follow up appointments.  Would like to lose weight before potential surgery as well.  Pelvic pain +/- bleeding.  Still  has  Nexplanon.  Recent Colpo/Bx CIN I, being monitored w f/u PAP every 6 mos.  Barnett Applebaum, MD, [facility], [facility] 03/20/2017  9:10 AM

## 2017-03-21 ENCOUNTER — Encounter: Payer: Self-pay | Admitting: Family Medicine

## 2017-03-21 LAB — HIV ANTIBODY (ROUTINE TESTING W REFLEX): HIV Screen 4th Generation wRfx: NONREACTIVE

## 2017-04-15 ENCOUNTER — Other Ambulatory Visit: Payer: Self-pay | Admitting: Family Medicine

## 2017-04-15 MED ORDER — CLONAZEPAM 1 MG PO TABS: 1.0000 mg | ORAL_TABLET | Freq: Every day | ORAL | 1 refills | Status: DC | PRN

## 2017-04-15 NOTE — Telephone Encounter (Signed)
Pt called and stated that she will run out of Klonopin July 4 and would like to know if she could have a refill [facility] to last her until her appt 05/09/17.

## 2017-04-15 NOTE — Telephone Encounter (Signed)
Pt last seen Oct 25, 2016. Next OV 05/09/17

## 2017-04-16 ENCOUNTER — Telehealth: Payer: Self-pay | Admitting: Family Medicine

## 2017-04-16 NOTE — Telephone Encounter (Signed)
Pt aware.

## 2017-04-16 NOTE — Telephone Encounter (Signed)
Rx is at front desk awaiting pick up

## 2017-04-16 NOTE — Telephone Encounter (Signed)
Patient called to get her klonopin refilled ASAP. She states she will be out of this after tonight.  She has f/u appt with Dr Jeananne Rama on 7/26.  Patient has been on this medication for several years and does not want to be out of the med.  Patient states even if she can get enough until her appt.  [facility]

## 2017-04-19 ENCOUNTER — Ambulatory Visit: Payer: Commercial Managed Care - PPO | Admitting: Obstetrics & Gynecology

## 2017-04-25 ENCOUNTER — Ambulatory Visit: Payer: Commercial Managed Care - PPO | Admitting: Family Medicine

## 2017-05-09 ENCOUNTER — Ambulatory Visit (INDEPENDENT_AMBULATORY_CARE_PROVIDER_SITE_OTHER): Payer: Commercial Managed Care - PPO | Admitting: Family Medicine

## 2017-05-09 ENCOUNTER — Ambulatory Visit: Payer: Commercial Managed Care - PPO | Admitting: Family Medicine

## 2017-05-09 ENCOUNTER — Encounter: Payer: Self-pay | Admitting: Family Medicine

## 2017-05-09 VITALS — BP 121/86 | HR 79 | Wt 185.0 lb

## 2017-05-09 DIAGNOSIS — I499 Cardiac arrhythmia, unspecified: Secondary | ICD-10-CM | POA: Diagnosis not present

## 2017-05-09 DIAGNOSIS — I10 Essential (primary) hypertension: Secondary | ICD-10-CM

## 2017-05-09 DIAGNOSIS — H539 Unspecified visual disturbance: Secondary | ICD-10-CM | POA: Diagnosis not present

## 2017-05-09 NOTE — Progress Notes (Signed)
   BP 121/86   Pulse 79   Wt 185 lb (83.9 kg)   SpO2 99%   BMI 33.84 kg/m    Subjective:    Patient ID: Victoria Shepard, female    DOB: 03-04-1971, 46 y.o.   MRN: [id number]  HPI: Victoria Shepard is a 46 y.o. female  Chief Complaint  Patient presents with  . Follow-up  . Hypertension  . Loss of Vision    One eye, brief.   On review of episode of loss of vision right eye patient had been napping sleeping on her right side woke up suddenly and could only see out of her left eye. This loss of vision lasted about one second. On further review this is most likely secondary to her right eyelid being closed from napping on that side than opening not with the left eye. Patient especially concerned because loss of some friends due to heart attacks recently. No noticed real palpitations or other symptoms. Relevant past medical, surgical, family and social history reviewed and updated as indicated. Interim medical history since our last visit reviewed. Allergies and medications reviewed and updated.  Review of Systems  Constitutional: Negative.   Respiratory: Negative.   Cardiovascular: Negative.     Per HPI unless specifically indicated above     Objective:    BP 121/86   Pulse 79   Wt 185 lb (83.9 kg)   SpO2 99%   BMI 33.84 kg/m   Wt Readings from Last 3 Encounters:  05/09/17 185 lb (83.9 kg)  03/20/17 190 lb (86.2 kg)  03/08/17 188 lb (85.3 kg)    Physical Exam  Constitutional: She is oriented to person, place, and time. She appears well-developed and well-nourished.  HENT:  Head: Normocephalic and atraumatic.  Eyes: Pupils are equal, round, and reactive to light. Conjunctivae and EOM are normal.  Neck: Normal range of motion. Neck supple. No tracheal deviation present. No thyromegaly present.  No carotid bruits  Cardiovascular: Normal rate, regular rhythm and normal heart sounds.   Pulmonary/Chest: Effort normal and breath sounds normal.  Musculoskeletal: Normal range  of motion.  Lymphadenopathy:    She has no cervical adenopathy.  Neurological: [name] and oriented to person, place, and time.  Skin: No erythema.  Psychiatric: She has a normal mood and affect. Her behavior is normal. Judgment and thought content normal.    Results for orders placed or performed in visit on 03/20/17  HIV antibody  Result Value Ref Range   HIV Screen 4th Generation wRfx Non Reactive Non Reactive      Assessment & Plan:   Problem List Items Addressed This Visit      Cardiovascular and Mediastinum   Hypertension - Primary   Relevant Orders   Basic metabolic panel     Other   Irregular heartbeat    Normal EKG      Relevant Orders   EKG 12-Lead (Completed)   Vision changes    Transient vision change most likely secondary to napping in eyelid opening delay         discuss vision change will observe if further symptoms will further evaluate reassurance given to patient.  Follow up plan: Return in about 6 months (around 11/09/2017) for Physical Exam.

## 2017-05-09 NOTE — Assessment & Plan Note (Signed)
Transient vision change most likely secondary to napping in eyelid opening delay

## 2017-05-09 NOTE — Assessment & Plan Note (Signed)
Normal EKG

## 2017-05-21 ENCOUNTER — Other Ambulatory Visit (INDEPENDENT_AMBULATORY_CARE_PROVIDER_SITE_OTHER): Payer: Commercial Managed Care - PPO

## 2017-05-21 ENCOUNTER — Other Ambulatory Visit (INDEPENDENT_AMBULATORY_CARE_PROVIDER_SITE_OTHER): Payer: Self-pay | Admitting: Optometry

## 2017-05-21 ENCOUNTER — Other Ambulatory Visit (INDEPENDENT_AMBULATORY_CARE_PROVIDER_SITE_OTHER): Payer: Self-pay

## 2017-05-21 DIAGNOSIS — H539 Unspecified visual disturbance: Secondary | ICD-10-CM

## 2017-05-23 ENCOUNTER — Other Ambulatory Visit (INDEPENDENT_AMBULATORY_CARE_PROVIDER_SITE_OTHER): Payer: Self-pay | Admitting: Optometry

## 2017-05-23 DIAGNOSIS — H539 Unspecified visual disturbance: Secondary | ICD-10-CM

## 2017-05-24 ENCOUNTER — Ambulatory Visit: Payer: Commercial Managed Care - PPO | Admitting: Obstetrics & Gynecology

## 2017-06-03 ENCOUNTER — Telehealth: Payer: Self-pay

## 2017-06-03 NOTE — Telephone Encounter (Signed)
Patient was transferred to provider for telephone conversation.   

## 2017-06-03 NOTE — Telephone Encounter (Signed)
Patient has questions about recent Carotid Artery Duplex exam done by [facility]. 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 Would like to know if this caused her eye to go black as reported at last OV. Does she need to take medication for this? Results placed on provider's desk for reference. Please advise.

## 2017-06-03 NOTE — Telephone Encounter (Signed)
Call pt 

## 2017-06-04 ENCOUNTER — Other Ambulatory Visit (INDEPENDENT_AMBULATORY_CARE_PROVIDER_SITE_OTHER): Payer: Self-pay | Admitting: Optometry

## 2017-06-04 DIAGNOSIS — H539 Unspecified visual disturbance: Secondary | ICD-10-CM

## 2017-06-10 ENCOUNTER — Other Ambulatory Visit: Payer: Self-pay | Admitting: Family Medicine

## 2017-06-10 MED ORDER — CITALOPRAM HYDROBROMIDE 20 MG PO TABS: 20.0000 mg | ORAL_TABLET | Freq: Every day | ORAL | 0 refills | Status: DC

## 2017-06-10 MED ORDER — CLONAZEPAM 1 MG PO TABS: 1.0000 mg | ORAL_TABLET | Freq: Every day | ORAL | 0 refills | Status: DC | PRN

## 2017-06-10 NOTE — Telephone Encounter (Signed)
Called into [facility], left on Geologist, engineering.

## 2017-06-10 NOTE — Telephone Encounter (Signed)
I do understand her reluctance to come in. I will refill her Rx until Dr. Jeananne Rama returns on 9/10 so that she doesn't run out and will make sure that this is in his box when he returns. He will call her when he returns to discuss this further. OK to call klonopin into her pharmacy.

## 2017-06-10 NOTE — Telephone Encounter (Signed)
Unfortunately, it looks like it's been a year since this was discussed in an appointment. We really should see her for this. Thanks!

## 2017-06-10 NOTE — Telephone Encounter (Signed)
Patient is requesting a refill on medication for Klonopin for September. Patient would like to call in refill for medication before she runs out so when medication is due she can pick it up.  Would like to raise medication for Celexa to 40 mg if possible as well.  Please Advise.  Thank you

## 2017-06-10 NOTE — Telephone Encounter (Signed)
Called patient to inform her that appointment would be needed. Patient had already spoke with CMA  And informed me that she did not want to make another appointment with another provider nor pay two copays. Patient very agitated that she cannot get refill on medication without appointment.  Refer to message per CMA Jada. F.  Please Advise.  Thank you

## 2017-06-10 NOTE — Telephone Encounter (Signed)
Called to discuss with patient. Pt very irritated as she states she was just here last month. Attempted to explain to patient that provider had not documented any discussion in regards to her anxiety/depression and/or medication. Pt states she is not going to pay two co pays, and is not comfortable seeing a provider that is not hers. Attempted to explain to patient that provider was uncomfortable writing RX w/o being seen since they do not have the relationship to her that Dr. Jeananne Rama does. Pt very volatile. Before call completed pt stated she was getting another call and disconnected the call. Pt did states she would run out on 06/18/17. Please advise.

## 2017-06-24 ENCOUNTER — Other Ambulatory Visit: Payer: Self-pay | Admitting: Family Medicine

## 2017-06-24 MED ORDER — CLONAZEPAM 1 MG PO TABS: 1.0000 mg | ORAL_TABLET | Freq: Every day | ORAL | 0 refills | Status: DC | PRN

## 2017-06-24 MED ORDER — CLONAZEPAM 1 MG PO TABS: 1.0000 mg | ORAL_TABLET | Freq: Every day | ORAL | 1 refills | Status: DC | PRN

## 2017-06-24 MED ORDER — CITALOPRAM HYDROBROMIDE 20 MG PO TABS: 20.0000 mg | ORAL_TABLET | Freq: Every day | ORAL | 2 refills | Status: DC

## 2017-06-24 NOTE — Addendum Note (Signed)
Addended by: Golden Pop A on: 06/24/2017 07:36 AM   Modules accepted: Orders

## 2017-06-24 NOTE — Telephone Encounter (Signed)
Your patient- note sent to you last week.

## 2017-06-24 NOTE — Telephone Encounter (Signed)
Provider sent pt in 6 tablets while you were on vacation. Pt would now like rest of supply

## 2017-06-25 ENCOUNTER — Telehealth: Payer: Self-pay | Admitting: Family Medicine

## 2017-06-25 NOTE — Telephone Encounter (Signed)
Faxed script for patients Klonopin 06-25-2017

## 2017-07-16 ENCOUNTER — Other Ambulatory Visit: Payer: Self-pay | Admitting: Family Medicine

## 2017-07-16 NOTE — Telephone Encounter (Signed)
Routing to Dr. Jeananne Rama for him to handle when he returns.

## 2017-07-16 NOTE — Telephone Encounter (Signed)
Patient requesting [facility]lon[facility]. Patient has enough medication for this week. Patient would like for Dr. Jeananne Rama to handle medication refill. Patient aware provider will be back in the office by Monday.  Please Advise.  Thank you

## 2017-07-22 MED ORDER — CLONAZEPAM 1 MG PO TABS: 1.0000 mg | ORAL_TABLET | Freq: Every day | ORAL | 1 refills | Status: DC | PRN

## 2017-07-22 NOTE — Telephone Encounter (Signed)
Called and let patient know that her RX was faxed in for her.

## 2017-07-23 ENCOUNTER — Telehealth: Payer: Self-pay | Admitting: Family Medicine

## 2017-07-23 NOTE — Telephone Encounter (Signed)
ERROR

## 2017-07-30 ENCOUNTER — Ambulatory Visit: Payer: Commercial Managed Care - PPO | Admitting: Family Medicine

## 2017-08-02 ENCOUNTER — Ambulatory Visit: Payer: Commercial Managed Care - PPO | Admitting: Obstetrics & Gynecology

## 2017-08-23 ENCOUNTER — Ambulatory Visit: Payer: Commercial Managed Care - PPO | Admitting: Obstetrics & Gynecology

## 2017-09-04 ENCOUNTER — Other Ambulatory Visit: Payer: Self-pay | Admitting: Family Medicine

## 2017-09-04 ENCOUNTER — Telehealth: Payer: Self-pay | Admitting: Family Medicine

## 2017-09-04 MED ORDER — CLONAZEPAM 1 MG PO TABS: 1.0000 mg | ORAL_TABLET | Freq: Every day | ORAL | 1 refills | Status: DC | PRN

## 2017-09-04 NOTE — Telephone Encounter (Signed)
rx printed

## 2017-09-04 NOTE — Telephone Encounter (Signed)
Pt requesting refill on controlled substance; see CRM [phone]

## 2017-09-04 NOTE — Telephone Encounter (Signed)
Copied from [place] [phone]. Topic: Quick Communication - See Telephone Encounter >> Sep 04, 2017  8:21 AM Aurelio Brash B wrote: CRM for notification. See Telephone encounter for:  Refill Klonopon  Wants to get refilled before Dec 1  [place]  09/04/17.

## 2017-11-12 ENCOUNTER — Encounter: Payer: Commercial Managed Care - PPO | Admitting: Family Medicine

## 2017-11-14 ENCOUNTER — Encounter: Payer: Self-pay | Admitting: Family Medicine

## 2017-11-14 ENCOUNTER — Ambulatory Visit (INDEPENDENT_AMBULATORY_CARE_PROVIDER_SITE_OTHER): Payer: Commercial Managed Care - PPO | Admitting: Family Medicine

## 2017-11-14 VITALS — BP 121/85 | HR 105 | Ht 62.99 in | Wt 198.0 lb

## 2017-11-14 DIAGNOSIS — K582 Mixed irritable bowel syndrome: Secondary | ICD-10-CM | POA: Diagnosis not present

## 2017-11-14 DIAGNOSIS — Z0001 Encounter for general adult medical examination with abnormal findings: Secondary | ICD-10-CM

## 2017-11-14 DIAGNOSIS — F329 Major depressive disorder, single episode, unspecified: Secondary | ICD-10-CM

## 2017-11-14 DIAGNOSIS — F429 Obsessive-compulsive disorder, unspecified: Secondary | ICD-10-CM | POA: Diagnosis not present

## 2017-11-14 DIAGNOSIS — Z0000 Encounter for general adult medical examination without abnormal findings: Secondary | ICD-10-CM

## 2017-11-14 DIAGNOSIS — I10 Essential (primary) hypertension: Secondary | ICD-10-CM

## 2017-11-14 DIAGNOSIS — F32A Depression, unspecified: Secondary | ICD-10-CM

## 2017-11-14 LAB — MICROSCOPIC EXAMINATION: Epithelial Cells (non renal): >10 /hpf — AB (ref 0–10)

## 2017-11-14 LAB — URINALYSIS, ROUTINE W REFLEX MICROSCOPIC
Bilirubin, UA: NEGATIVE
GLUCOSE, UA: NEGATIVE
Nitrite, UA: NEGATIVE
Specific Gravity, UA: 1.025 (ref 1.005–1.030)
Urobilinogen, Ur: 0.2 mg/dL (ref 0.2–1.0)
pH, UA: 5.5 (ref 5.0–7.5)

## 2017-11-14 MED ORDER — CLONAZEPAM 1 MG PO TABS: 1.0000 mg | ORAL_TABLET | Freq: Every day | ORAL | 5 refills | Status: DC | PRN

## 2017-11-14 MED ORDER — LINACLOTIDE 145 MCG PO CAPS: 145.0000 ug | ORAL_CAPSULE | Freq: Every day | ORAL | 4 refills | Status: DC

## 2017-11-14 MED ORDER — FLUTICASONE FUROATE-VILANTEROL 100-25 MCG/INH IN AEPB: 1.0000 | INHALATION_SPRAY | Freq: Every day | RESPIRATORY_TRACT | 12 refills | Status: DC

## 2017-11-14 MED ORDER — CITALOPRAM HYDROBROMIDE 40 MG PO TABS: 40.0000 mg | ORAL_TABLET | Freq: Every day | ORAL | 4 refills | Status: DC

## 2017-11-14 NOTE — Assessment & Plan Note (Signed)
The current medical regimen is effective;  continue present plan and medications.  

## 2017-11-14 NOTE — Progress Notes (Signed)
BP 121/85   Pulse (!) 105   Ht 5' 2.99" (1.6 m)   Wt 198 lb (89.8 kg)   SpO2 99%   BMI 35.08 kg/m    Subjective:    Patient ID: Victoria Shepard, female    DOB: 29-Jun-1971, 47 y.o.   MRN: [id number]  HPI: Victoria Shepard is a 47 y.o. female  Chief Complaint  Patient presents with  . Annual Exam   Patient all in all doing okay except for OCD may be a little worse taking citalopram 20 wondering about increasing to 40. Takes clonazepam almost daily but not quite.  Anxiety stable. Chronic constipation helped with Lizness does okay with that. Uses inhaler when starts getting warmer and gets outside more.  Relevant past medical, surgical, family and social history reviewed and updated as indicated. Interim medical history since our last visit reviewed. Allergies and medications reviewed and updated.  Review of Systems  Constitutional: Negative.   HENT: Negative.   Eyes: Negative.   Respiratory: Negative.   Cardiovascular: Negative.   Gastrointestinal: Negative.   Endocrine: Negative.   Genitourinary: Negative.   Musculoskeletal: Negative.   Skin: Negative.   Allergic/Immunologic: Negative.   Neurological: Negative.   Hematological: Negative.   Psychiatric/Behavioral: Negative.     Per HPI unless specifically indicated above     Objective:    BP 121/85   Pulse (!) 105   Ht 5' 2.99" (1.6 m)   Wt 198 lb (89.8 kg)   SpO2 99%   BMI 35.08 kg/m   Wt Readings from Last 3 Encounters:  11/14/17 198 lb (89.8 kg)  05/09/17 185 lb (83.9 kg)  03/20/17 190 lb (86.2 kg)    Physical Exam  Constitutional: She is oriented to person, place, and time. She appears well-developed and well-nourished.  HENT:  Head: Normocephalic and atraumatic.  Right Ear: External ear normal.  Left Ear: External ear normal.  Nose: Nose normal.  Mouth/Throat: Oropharynx is clear and moist.  Eyes: Conjunctivae and EOM are normal. Pupils are equal, round, and reactive to light.  Neck: Normal range  of motion. Neck supple. Carotid bruit is not present.  Cardiovascular: Normal rate, regular rhythm and normal heart sounds.  No murmur heard. Pulmonary/Chest: Effort normal and breath sounds normal. She exhibits no mass. Right breast exhibits no mass, no skin change and no tenderness. Left breast exhibits no mass, no skin change and no tenderness. Breasts are symmetrical.  Abdominal: Soft. Bowel sounds are normal. There is no hepatosplenomegaly.  Musculoskeletal: Normal range of motion.  Neurological: [name] and oriented to person, place, and time.  Skin: No rash noted.  Psychiatric: She has a normal mood and affect. Her behavior is normal. Judgment and thought content normal.    Results for orders placed or performed in visit on 03/20/17  HIV antibody  Result Value Ref Range   HIV Screen 4th Generation wRfx Non Reactive Non Reactive      Assessment & Plan:   Problem List Items Addressed This Visit      Cardiovascular and Mediastinum   Hypertension    The current medical regimen is effective;  continue present plan and medications.         Digestive   IBS (irritable bowel syndrome)   Relevant Medications   linaclotide (LINZESS) 145 MCG CAPS capsule     Other   Depression    The current medical regimen is effective;  continue present plan and medications.  Relevant Medications   citalopram (CELEXA) 40 MG tablet   OCD (obsessive compulsive disorder)    The current medical regimen is effective;  continue present plan and medications.       Relevant Medications   citalopram (CELEXA) 40 MG tablet    Other Visit Diagnoses    PE (physical exam), annual    -  Primary   Relevant Orders   HIV antibody   Comprehensive metabolic panel   Lipid panel   CBC with Differential/Platelet   TSH   Urinalysis, Routine w reflex microscopic       Follow up plan: Return in about 6 months (around 05/14/2018), or if symptoms worsen or fail to improve.

## 2017-11-15 ENCOUNTER — Other Ambulatory Visit: Payer: Self-pay | Admitting: Family Medicine

## 2017-11-15 LAB — COMPREHENSIVE METABOLIC PANEL
ALT: 19 IU/L (ref 0–32)
AST: 19 IU/L (ref 0–40)
Albumin/Globulin Ratio: 1.5 (ref 1.2–2.2)
Albumin: 4.5 g/dL (ref 3.5–5.5)
Alkaline Phosphatase: 66 IU/L (ref 39–117)
BILIRUBIN TOTAL: 1.4 mg/dL — AB (ref 0.0–1.2)
BUN/Creatinine Ratio: 11 (ref 9–23)
BUN: 9 mg/dL (ref 6–24)
CALCIUM: 9.5 mg/dL (ref 8.7–10.2)
CHLORIDE: 105 mmol/L (ref 96–106)
CO2: 20 mmol/L (ref 20–29)
Creatinine, Ser: 0.79 mg/dL (ref 0.57–1.00)
GFR, EST AFRICAN AMERICAN: 105 mL/min/{1.73_m2} (ref >59)
GFR, EST NON AFRICAN AMERICAN: 91 mL/min/{1.73_m2} (ref >59)
GLOBULIN, TOTAL: 3 g/dL (ref 1.5–4.5)
Glucose: 93 mg/dL (ref 65–99)
POTASSIUM: 4.7 mmol/L (ref 3.5–5.2)
SODIUM: 142 mmol/L (ref 134–144)
Total Protein: 7.5 g/dL (ref 6.0–8.5)

## 2017-11-15 LAB — LIPID PANEL
CHOL/HDL RATIO: 5.2 ratio — AB (ref 0.0–4.4)
Cholesterol, Total: 198 mg/dL (ref 100–199)
HDL: 38 mg/dL — ABNORMAL LOW (ref >39)
LDL Calculated: 137 mg/dL — ABNORMAL HIGH (ref 0–99)
Triglycerides: 115 mg/dL (ref 0–149)
VLDL Cholesterol Cal: 23 mg/dL (ref 5–40)

## 2017-11-15 LAB — CBC WITH DIFFERENTIAL/PLATELET
BASOS ABS: 0 10*3/uL (ref 0.0–0.2)
Basos: 0 %
EOS (ABSOLUTE): 0.1 10*3/uL (ref 0.0–0.4)
EOS: 1 %
HEMOGLOBIN: 14.5 g/dL (ref 11.1–15.9)
Hematocrit: 44.8 % (ref 34.0–46.6)
IMMATURE GRANS (ABS): 0 10*3/uL (ref 0.0–0.1)
Immature Granulocytes: 0 %
LYMPHS: 30 %
Lymphocytes Absolute: 2.2 10*3/uL (ref 0.7–3.1)
MCH: 29.2 pg (ref 26.6–33.0)
MCHC: 32.4 g/dL (ref 31.5–35.7)
MCV: 90 fL (ref 79–97)
MONOCYTES: 5 %
Monocytes Absolute: 0.4 10*3/uL (ref 0.1–0.9)
Neutrophils Absolute: 4.6 10*3/uL (ref 1.4–7.0)
Neutrophils: 64 %
Platelets: 359 10*3/uL (ref 150–379)
RBC: 4.97 x10E6/uL (ref 3.77–5.28)
RDW: 13.7 % (ref 12.3–15.4)
WBC: 7.2 10*3/uL (ref 3.4–10.8)

## 2017-11-15 LAB — TSH: TSH: 1.95 u[IU]/mL (ref 0.450–4.500)

## 2017-11-15 LAB — HIV ANTIBODY (ROUTINE TESTING W REFLEX): HIV SCREEN 4TH GENERATION: NONREACTIVE

## 2017-11-15 NOTE — Telephone Encounter (Signed)
Mometasone refill request Last filled  10/25/16. Last OV 11/14/17 but nothing mentioned about the use of this medication. [facility]

## 2017-11-18 ENCOUNTER — Encounter: Payer: Self-pay | Admitting: Family Medicine

## 2017-11-19 ENCOUNTER — Other Ambulatory Visit: Payer: Self-pay | Admitting: Family Medicine

## 2017-11-19 MED ORDER — VALACYCLOVIR HCL 500 MG PO TABS: 500.0000 mg | ORAL_TABLET | Freq: Two times a day (BID) | ORAL | 6 refills | Status: DC

## 2017-11-19 NOTE — Telephone Encounter (Unsigned)
Copied from [place] [phone]. Topic: General - Other >> Nov 19, 2017  7:59 AM Yvette Rack wrote: Reason for CRM: patient calling about labs

## 2017-11-25 ENCOUNTER — Other Ambulatory Visit: Payer: Self-pay | Admitting: Family Medicine

## 2017-11-25 DIAGNOSIS — Z1231 Encounter for screening mammogram for malignant neoplasm of breast: Secondary | ICD-10-CM

## 2017-12-04 ENCOUNTER — Ambulatory Visit
Admission: RE | Admit: 2017-12-04 | Discharge: 2017-12-04 | Disposition: A | Discharge status: Home / Self Care | Payer: Commercial Managed Care - PPO | Source: Ambulatory Visit | Attending: Family Medicine | Admitting: Family Medicine

## 2017-12-04 DIAGNOSIS — Z1231 Encounter for screening mammogram for malignant neoplasm of breast: Secondary | ICD-10-CM | POA: Insufficient documentation

## 2018-05-14 ENCOUNTER — Ambulatory Visit: Payer: Self-pay

## 2018-05-14 ENCOUNTER — Ambulatory Visit (INDEPENDENT_AMBULATORY_CARE_PROVIDER_SITE_OTHER): Payer: Commercial Managed Care - PPO | Admitting: Unknown Physician Specialty

## 2018-05-14 ENCOUNTER — Encounter: Payer: Self-pay | Admitting: Unknown Physician Specialty

## 2018-05-14 VITALS — BP 146/97 | HR 90 | Temp 99.1°F | Ht 63.0 in | Wt 191.4 lb

## 2018-05-14 DIAGNOSIS — R079 Chest pain, unspecified: Secondary | ICD-10-CM | POA: Diagnosis not present

## 2018-05-14 DIAGNOSIS — I10 Essential (primary) hypertension: Secondary | ICD-10-CM | POA: Diagnosis not present

## 2018-05-14 DIAGNOSIS — F419 Anxiety disorder, unspecified: Secondary | ICD-10-CM

## 2018-05-14 MED ORDER — PANTOPRAZOLE SODIUM 40 MG PO TBEC: 40.0000 mg | DELAYED_RELEASE_TABLET | Freq: Every day | ORAL | 3 refills | Status: DC

## 2018-05-14 MED ORDER — CLONAZEPAM 1 MG PO TABS: 1.0000 mg | ORAL_TABLET | Freq: Every day | ORAL | 0 refills | Status: DC | PRN

## 2018-05-14 NOTE — Progress Notes (Signed)
BP (!) 146/97   Pulse 90   Temp 99.1 F (37.3 C) (Oral)   Ht 5\' 3"  (1.6 m)   Wt 191 lb 6.4 oz (86.8 kg)   SpO2 97%   BMI 33.90 kg/m    Subjective:    Patient ID: Victoria Shepard, female    DOB: Dec 31, 1970, 47 y.o.   MRN: [id number]  HPI: Victoria Shepard is a 47 y.o. female  Chief Complaint  Patient presents with  . Chest Pain    pt states she has been having pain in her chest like indegestion pain, states she feels like she has to burp. States she has tried taking tums and omeprazole that give little relief.    Chest pain Pt describes chest pain in mid-chest area.  States the pain started on a day when she ate spicey food.  Pain seems to be constant.  States carbonated drinks or spicey foods make it worse.  States burping and Tums seems to help.  Often getting up and walking helps.  Omeprazole not helpful which she took for 4 days.  Denies SOB, diaphoresis.  Constipation for years but Linzess helps.  No nausea or vomiting  Chart revview shows last LDL is 137  Relevant past medical, surgical, family and social history reviewed and updated as indicated. Interim medical history since our last visit reviewed. Allergies and medications reviewed and updated.  Review of Systems  Per HPI unless specifically indicated above     Objective:    BP (!) 146/97   Pulse 90   Temp 99.1 F (37.3 C) (Oral)   Ht 5\' 3"  (1.6 m)   Wt 191 lb 6.4 oz (86.8 kg)   SpO2 97%   BMI 33.90 kg/m   Wt Readings from Last 3 Encounters:  05/14/18 191 lb 6.4 oz (86.8 kg)  11/14/17 198 lb (89.8 kg)  05/09/17 185 lb (83.9 kg)    Physical Exam  Constitutional: She is oriented to person, place, and time. She appears well-developed and well-nourished. No distress.  HENT:  Head: Normocephalic and atraumatic.  Eyes: Conjunctivae and lids are normal. Right eye exhibits no discharge. Left eye exhibits no discharge. No scleral icterus.  Neck: Normal range of motion. Neck supple. No JVD present. Carotid bruit  is not present.  Cardiovascular: Normal rate, regular rhythm and normal heart sounds.  Pulmonary/Chest: Effort normal and breath sounds normal.  Abdominal: Soft. Normal appearance. She exhibits no mass. There is no splenomegaly or hepatomegaly. There is no tenderness. There is no rebound and no guarding.  Musculoskeletal: Normal range of motion.  Neurological: [name] and oriented to person, place, and time.  Skin: Skin is warm, dry and intact. No rash noted. No pallor.  Psychiatric: She has a normal mood and affect. Her behavior is normal. Judgment and thought content normal.     EKG NSR No STTW changes.  No change in comparison to previous EKG Assessment & Plan:   Problem List Items Addressed This Visit      Unprioritized   Anxiety    Pt wanted another rx now as runs out before appt with Dr. Jeananne Shepard      Chest pain - Primary    Normal EKG.  Symptoms suggestive of gastric reflux.  Change to Protonix 40 mg.  Refer to GI for further evaluation.  To the ER if symptoms accompanied by SOB and sweating or worsening symptoms.        Relevant Orders   EKG 12-Lead (Completed)  Ambulatory referral to Gastroenterology   Hypertension    Not to goal.  Pt not currently on medicatons.  And last BP 121/72.  Pt is due to be seen next week.  Will assess again.            Follow up plan: Return if symptoms worsen or fail to improve.

## 2018-05-14 NOTE — Telephone Encounter (Signed)
Pt. Reports heartburn and chest pain x 1 week. Pain is "upper chest". No radiation, no shortness of breath, no nausea or sweating. Pain goes away with TUMS and OTC Prilosec."Goes to bed pain-free and wakes up hurting." No availability with Dr. Jeananne Rama. Appointment made for today.  Reason for Disposition . [1] Chest pain lasts > 5 minutes AND [2] occurred > 3 days ago (72 hours) AND [3] NO chest pain or cardiac symptoms now  Answer Assessment - Initial Assessment Questions 1. LOCATION: "Where does it hurt?"       Top of chest 2. RADIATION: "Does the pain go anywhere else?" (e.g., into neck, jaw, arms, back)     No 3. ONSET: "When did the chest pain begin?" (Minutes, hours or days)      Started 1 week ago 4. PATTERN "Does the pain come and go, or has it been constant since it started?"  "Does it get worse with exertion?"      Comes and goes with TUMS 5. DURATION: "How long does it last" (e.g., seconds, minutes, hours)     Lasts 10 minutes 6. SEVERITY: "How bad is the pain?"  (e.g., Scale 1-10; mild, moderate, or severe)    - MILD (1-3): doesn't interfere with normal activities     - MODERATE (4-7): interferes with normal activities or awakens from sleep    - SEVERE (8-10): excruciating pain, unable to do any normal activities       5 at the most 7. CARDIAC RISK FACTORS: "Do you have any history of heart problems or risk factors for heart disease?" (e.g., prior heart attack, angina; high blood pressure, diabetes, being overweight, high cholesterol, smoking, or strong family history of heart disease)     No 8. PULMONARY RISK FACTORS: "Do you have any history of lung disease?"  (e.g., blood clots in lung, asthma, emphysema, birth control pills)     No 9. CAUSE: "What do you think is causing the chest pain?"     Heart burn 10. OTHER SYMPTOMS: "Do you have any other symptoms?" (e.g., dizziness, nausea, vomiting, sweating, fever, difficulty breathing, cough)       No 11. PREGNANCY: "Is there  any chance you are pregnant?" "When was your last menstrual period?"       No  Protocols used: CHEST PAIN-A-AH

## 2018-05-14 NOTE — Assessment & Plan Note (Addendum)
Normal EKG.  Symptoms suggestive of gastric reflux.  Change to Protonix 40 mg.  Refer to GI for further evaluation.  To the ER if symptoms accompanied by SOB and sweating or worsening symptoms.

## 2018-05-14 NOTE — Assessment & Plan Note (Signed)
Pt wanted another rx now as runs out before appt with Dr. Jeananne Rama

## 2018-05-14 NOTE — Assessment & Plan Note (Signed)
Not to goal.  Pt not currently on medicatons.  And last BP 121/72.  Pt is due to be seen next week.  Will assess again.

## 2018-05-22 ENCOUNTER — Ambulatory Visit (INDEPENDENT_AMBULATORY_CARE_PROVIDER_SITE_OTHER): Payer: Commercial Managed Care - PPO | Admitting: Family Medicine

## 2018-05-22 ENCOUNTER — Encounter: Payer: Self-pay | Admitting: Family Medicine

## 2018-05-22 DIAGNOSIS — F329 Major depressive disorder, single episode, unspecified: Secondary | ICD-10-CM | POA: Diagnosis not present

## 2018-05-22 DIAGNOSIS — F419 Anxiety disorder, unspecified: Secondary | ICD-10-CM

## 2018-05-22 DIAGNOSIS — F32A Depression, unspecified: Secondary | ICD-10-CM

## 2018-05-22 DIAGNOSIS — R5383 Other fatigue: Secondary | ICD-10-CM

## 2018-05-22 MED ORDER — PANTOPRAZOLE SODIUM 40 MG PO TBEC: 40.0000 mg | DELAYED_RELEASE_TABLET | Freq: Every day | ORAL | 3 refills | Status: DC

## 2018-05-22 MED ORDER — CLONAZEPAM 1 MG PO TABS: 1.0000 mg | ORAL_TABLET | Freq: Every day | ORAL | 4 refills | Status: DC | PRN

## 2018-05-22 NOTE — Assessment & Plan Note (Addendum)
For daytime fatigue and nighttime gasping for breath will refer for sleep study. Further diagnostic reasons for sleep study include excessive daytime sleepiness habitual snoring gasping choking that awakens patient BMI greater than 33.

## 2018-05-22 NOTE — Assessment & Plan Note (Signed)
The current medical regimen is effective;  continue present plan and medications.  

## 2018-05-22 NOTE — Progress Notes (Signed)
BP 126/81   Pulse 90   Wt 193 lb (87.5 kg)   SpO2 97%   BMI 34.19 kg/m    Subjective:    Patient ID: Victoria Shepard, female    DOB: December 25, 1970, 47 y.o.   MRN: [id number]  HPI: Victoria Shepard is a 47 y.o. female  Chief Complaint  Patient presents with  . Follow-up  . Depression  Patient all in all doing much better depression score of 4 with residual sleep and eating issues. Patient depression otherwise doing well. Patient's anxiety reviewed clonazepam usage takes at nighttime for anxiety especially waking up gasping for air. Has some daytime drowsiness. Uses clonazepam probably 20 out of 30 nights. Reviewed Epworth sleep scale with a score of 8.  Patient with lower score because of her anxieties.  Due to her anxiety she cannot relax. See feeling great sleep questions for further details which will be scanned into chart.  Chest pains resolved with Protonix.  Which is doing well.  No further symptoms and reluctant to consider GI referral at this time which will defer for now.  Relevant past medical, surgical, family and social history reviewed and updated as indicated. Interim medical history since our last visit reviewed. Allergies and medications reviewed and updated.  Review of Systems  Constitutional: Positive for fatigue.  Respiratory: Negative.   Cardiovascular: Negative.     Per HPI unless specifically indicated above     Objective:    BP 126/81   Pulse 90   Wt 193 lb (87.5 kg)   SpO2 97%   BMI 34.19 kg/m   Wt Readings from Last 3 Encounters:  05/22/18 193 lb (87.5 kg)  05/14/18 191 lb 6.4 oz (86.8 kg)  11/14/17 198 lb (89.8 kg)    Physical Exam  Constitutional: She is oriented to person, place, and time. She appears well-developed and well-nourished.  HENT:  Head: Normocephalic and atraumatic.  Eyes: Conjunctivae and EOM are normal.  Neck: Normal range of motion.  Cardiovascular: Normal rate, regular rhythm and normal heart sounds.  Pulmonary/Chest:  Effort normal and breath sounds normal.  Musculoskeletal: Normal range of motion.  Neurological: [name] and oriented to person, place, and time.  Skin: No erythema.  Psychiatric: She has a normal mood and affect. Her behavior is normal. Judgment and thought content normal.    Results for orders placed or performed in visit on 11/14/17  Microscopic Examination  Result Value Ref Range   WBC, UA 6-10 (A) 0 - 5 /hpf   RBC, UA 3-10 (A) 0 - 2 /hpf   Epithelial Cells (non renal) >10 (A) 0 - 10 /hpf   Mucus, UA Present Not Estab.   Bacteria, UA Few None seen/Few  HIV antibody  Result Value Ref Range   HIV Screen 4th Generation wRfx Non Reactive Non Reactive  Comprehensive metabolic panel  Result Value Ref Range   Glucose 93 65 - 99 mg/dL   BUN 9 6 - 24 mg/dL   Creatinine, Ser 0.79 0.57 - 1.00 mg/dL   GFR calc non Af Amer 91 >59 mL/min/1.73   GFR calc Af Amer 105 >59 mL/min/1.73   BUN/Creatinine Ratio 11 9 - 23   Sodium 142 134 - 144 mmol/L   Potassium 4.7 3.5 - 5.2 mmol/L   Chloride 105 96 - 106 mmol/L   CO2 20 20 - 29 mmol/L   Calcium 9.5 8.7 - 10.2 mg/dL   Total Protein 7.5 6.0 - 8.5 g/dL   Albumin  4.5 3.5 - 5.5 g/dL   Globulin, Total 3.0 1.5 - 4.5 g/dL   Albumin/Globulin Ratio 1.5 1.2 - 2.2   Bilirubin Total 1.4 (H) 0.0 - 1.2 mg/dL   Alkaline Phosphatase 66 39 - 117 IU/L   AST 19 0 - 40 IU/L   ALT 19 0 - 32 IU/L  Lipid panel  Result Value Ref Range   Cholesterol, Total 198 100 - 199 mg/dL   Triglycerides 115 0 - 149 mg/dL   HDL 38 (L) >39 mg/dL   VLDL Cholesterol Cal 23 5 - 40 mg/dL   LDL Calculated 137 (H) 0 - 99 mg/dL   Chol/HDL Ratio 5.2 (H) 0.0 - 4.4 ratio  CBC with Differential/Platelet  Result Value Ref Range   WBC 7.2 3.4 - 10.8 x10E3/uL   RBC 4.97 3.77 - 5.28 x10E6/uL   Hemoglobin 14.5 11.1 - 15.9 g/dL   Hematocrit 44.8 34.0 - 46.6 %   MCV 90 79 - 97 fL   MCH 29.2 26.6 - 33.0 pg   MCHC 32.4 31.5 - 35.7 g/dL   RDW 13.7 12.3 - 15.4 %   Platelets 359 150  - 379 x10E3/uL   Neutrophils 64 Not Estab. %   Lymphs 30 Not Estab. %   Monocytes 5 Not Estab. %   Eos 1 Not Estab. %   Basos 0 Not Estab. %   Neutrophils Absolute 4.6 1.4 - 7.0 x10E3/uL   Lymphocytes Absolute 2.2 0.7 - 3.1 x10E3/uL   Monocytes Absolute 0.4 0.1 - 0.9 x10E3/uL   EOS (ABSOLUTE) 0.1 0.0 - 0.4 x10E3/uL   Basophils Absolute 0.0 0.0 - 0.2 x10E3/uL   Immature Granulocytes 0 Not Estab. %   Immature Grans (Abs) 0.0 0.0 - 0.1 x10E3/uL  TSH  Result Value Ref Range   TSH 1.950 0.450 - 4.500 uIU/mL  Urinalysis, Routine w reflex microscopic  Result Value Ref Range   Specific Gravity, UA 1.025 1.005 - 1.030   pH, UA 5.5 5.0 - 7.5   Color, UA Orange Yellow   Appearance Ur Turbid (A) Clear   Leukocytes, UA Trace (A) Negative   Protein, UA Trace (A) Negative/Trace   Glucose, UA Negative Negative   Ketones, UA Trace (A) Negative   RBC, UA 3+ (A) Negative   Bilirubin, UA Negative Negative   Urobilinogen, Ur 0.2 0.2 - 1.0 mg/dL   Nitrite, UA Negative Negative   Microscopic Examination See below:       Assessment & Plan:   Problem List Items Addressed This Visit      Other   Anxiety    Discussed anxiety care and treatment will continue as needed clonazepam trying to limit use.  Will give prescription for refills      Relevant Medications   clonazePAM (KLONOPIN) 1 MG tablet   Depression    The current medical regimen is effective;  continue present plan and medications.       Fatigue    For daytime fatigue and nighttime gasping for breath will refer for sleep study. Further diagnostic reasons for sleep study include excessive daytime sleepiness habitual snoring gasping choking that awakens patient BMI greater than 33.      Relevant Orders   Ambulatory referral to Sleep Studies       Follow up plan: Return in about 3 months (around 08/22/2018).

## 2018-05-22 NOTE — Assessment & Plan Note (Signed)
Discussed anxiety care and treatment will continue as needed clonazepam trying to limit use.  Will give prescription for refills

## 2018-06-19 ENCOUNTER — Ambulatory Visit: Payer: Commercial Managed Care - PPO | Admitting: Gastroenterology

## 2018-09-01 ENCOUNTER — Ambulatory Visit: Payer: Commercial Managed Care - PPO | Admitting: Family Medicine

## 2018-09-19 ENCOUNTER — Encounter: Payer: Self-pay | Admitting: Obstetrics & Gynecology

## 2018-09-19 ENCOUNTER — Ambulatory Visit (INDEPENDENT_AMBULATORY_CARE_PROVIDER_SITE_OTHER): Payer: Commercial Managed Care - PPO | Admitting: Obstetrics & Gynecology

## 2018-09-19 VITALS — BP 140/80 | Ht 63.0 in | Wt 195.0 lb

## 2018-09-19 DIAGNOSIS — Z3049 Encounter for surveillance of other contraceptives: Secondary | ICD-10-CM | POA: Diagnosis not present

## 2018-09-19 DIAGNOSIS — N912 Amenorrhea, unspecified: Secondary | ICD-10-CM

## 2018-09-19 DIAGNOSIS — Z23 Encounter for immunization: Secondary | ICD-10-CM | POA: Diagnosis not present

## 2018-09-19 NOTE — Patient Instructions (Signed)
Nexplanon Instructions After Insertion  Keep bandage clean and dry for 24 hours  May use ice/Tylenol/Ibuprofen for soreness or pain  If you develop fever, drainage or increased warmth from incision site-contact office immediately   

## 2018-09-19 NOTE — Progress Notes (Signed)
  Nexplanon removal Procedure note - The Nexplanon was noted in the patient's arm and the end was identified. The skin was cleansed with a Betadine solution. A small injection of subcutaneous lidocaine with epinephrine was given over the end of the implant. An incision was made at the end of the implant. The rod was noted in the incision and grasped with a hemostat. It was noted to be intact.  Steri-Strip was placed approximating the incision. Hemostasis was noted.  Pt desires pregnancy, counseled as to risks based on age. Pt has had no periods w Nexplanon.  Denies menopausal sx's.  She has had Nexplanon for 7 years (replaced every 3). Prior NSVD x3 w first spouse.  Now remarried after being widowed.  New spouse has no children and she would like to try.  Plan- monitor periods after removal.  Also test w [facility] for ovulation.  Can consider labs [facility], etc).  Needs PAP soon.  Last PAP and Colpo was 2018 ([facility] I) w none since    Also MMG  Hoyt Koch ,MD 09/19/2018,12:18 PM

## 2018-10-24 ENCOUNTER — Ambulatory Visit (INDEPENDENT_AMBULATORY_CARE_PROVIDER_SITE_OTHER): Payer: Commercial Managed Care - PPO | Admitting: Obstetrics & Gynecology

## 2018-10-24 ENCOUNTER — Encounter: Payer: Self-pay | Admitting: Obstetrics & Gynecology

## 2018-10-24 ENCOUNTER — Other Ambulatory Visit (HOSPITAL_COMMUNITY)
Admission: RE | Admit: 2018-10-24 | Discharge: 2018-10-24 | Disposition: A | Discharge status: Home / Self Care | Payer: Commercial Managed Care - PPO | Source: Ambulatory Visit | Attending: Obstetrics & Gynecology | Admitting: Obstetrics & Gynecology

## 2018-10-24 VITALS — BP 120/80 | Ht 63.0 in | Wt 202.0 lb

## 2018-10-24 DIAGNOSIS — N914 Secondary oligomenorrhea: Secondary | ICD-10-CM

## 2018-10-24 DIAGNOSIS — N870 Mild cervical dysplasia: Secondary | ICD-10-CM | POA: Insufficient documentation

## 2018-10-24 DIAGNOSIS — Z1239 Encounter for other screening for malignant neoplasm of breast: Secondary | ICD-10-CM

## 2018-10-24 DIAGNOSIS — N926 Irregular menstruation, unspecified: Secondary | ICD-10-CM

## 2018-10-24 DIAGNOSIS — Z01419 Encounter for gynecological examination (general) (routine) without abnormal findings: Secondary | ICD-10-CM

## 2018-10-24 NOTE — Patient Instructions (Addendum)
PAP every one year Mammogram every year    Call [phone] to schedule at [facility] today

## 2018-10-24 NOTE — Progress Notes (Signed)
HPI:      [name] is a 48 y.o. G3P3 who LMP was No LMP recorded., she presents today for her annual examination. NEXPLANON removed last month, desire for pregnancy.  Feelings of edema and bloating since removal.  No period yet.  The patient is sexually active. 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 The patient does perform self breast exams.  There is no notable family history of breast or ovarian cancer in her family.  The patient has regular exercise: yes.  The patient denies current symptoms of depression.    GYN History: Contraception: none  PMHx: Past Medical History:  Diagnosis Date  . Anxiety   . Depression   . Heart murmur   . Herpes   . History of abnormal mammogram 2010  . History of cellulitis    belly button  . Hypercholesteremia   . Hypertension   . Nexplanon in place    placed 10/11/14, remove 10/11/17  . OCD (obsessive compulsive disorder)    Past Surgical History:  Procedure Laterality Date  . BIOPSY BREAST  04/2010   was normal  . BREAST EXCISIONAL BIOPSY Left 2011   benign  . BREAST SURGERY Left 2011  . COLONOSCOPY  2010  . COSMETIC SURGERY  2012  . HERNIA REPAIR  2012  . LIPOSUCTION  2012  . MM DUCTOGRAM BILAT R/S  2010   due to bloody breast discharge   Family History  Problem Relation Age of Onset  . Hypertension Mother   . Hyperlipidemia Mother   . Diabetes Mother   . Heart disease Father        CABG  . Hypertension Father   . Mental illness Brother        anxiety  . Cancer Maternal Grandmother        lung  . Stroke Paternal Grandmother   . Stroke Paternal Grandfather    Social History   Tobacco Use  . Smoking status: Never Smoker  . Smokeless tobacco: Never Used  Substance Use Topics  . Alcohol use: No  . Drug use: No    Current Outpatient Medications:  .  citalopram (CELEXA) 40 MG tablet, Take 1 tablet (40 mg total) by  mouth daily., Disp: 90 tablet, Rfl: 4 .  clonazePAM (KLONOPIN) 1 MG tablet, Take 1 tablet (1 mg total) by mouth daily as needed for anxiety., Disp: 30 tablet, Rfl: 4 .  etonogestrel (NEXPLANON) 68 MG IMPL implant, Inject into the skin., Disp: , Rfl:  .  fluticasone furoate-vilanterol (BREO ELLIPTA) 100-25 MCG/INH AEPB, Inhale 1 puff into the lungs daily., Disp: 1 each, Rfl: 12 .  linaclotide (LINZESS) 145 MCG CAPS capsule, Take 1 capsule (145 mcg total) by mouth daily before breakfast., Disp: 90 capsule, Rfl: 4 .  mometasone (ELOCON) 0.1 % cream, APPLY DAILY, Disp: 45 g, Rfl: 1 .  pantoprazole (PROTONIX) 40 MG tablet, Take 1 tablet (40 mg total) by mouth daily., Disp: 90 tablet, Rfl: 3 .  valACYclovir (VALTREX) 500 MG tablet, Take 1 tablet (500 mg total) by mouth 2 (two) times daily., Disp: 60 tablet, Rfl: 6 Allergies: Amoxicillin; Penicillins; Oseltamivir; Bactrim [sulfamethoxazole-trimethoprim]; Erythromycin; and Keflex [cephalexin]  Review of Systems  Constitutional: Positive for malaise/fatigue. Negative for chills and fever.  HENT: Negative for congestion, sinus pain and sore throat.   Eyes: Negative for blurred vision and pain.  Respiratory: Negative for cough and  wheezing.   Cardiovascular: Negative for chest pain and leg swelling.  Gastrointestinal: Negative for abdominal pain, constipation, diarrhea, heartburn, nausea and vomiting.  Genitourinary: Negative for dysuria, frequency, hematuria and urgency.  Musculoskeletal: Negative for back pain, joint pain, myalgias and neck pain.  Skin: Negative for itching and rash.  Neurological: Negative for dizziness, tremors and weakness.  Endo/Heme/Allergies: Does not bruise/bleed easily.  Psychiatric/Behavioral: Negative for depression. The patient is not nervous/anxious and does not have insomnia.     Objective: BP 120/80   Ht 5\' 3"  (1.6 m)   Wt 202 lb (91.6 kg)   BMI 35.78 kg/m   Filed Weights   10/24/18 0904  Weight: 202 lb (91.6  kg)   Body mass index is 35.78 kg/m. Physical Exam Constitutional:      General: She is not in acute distress.    Appearance: She is well-developed.  Genitourinary:     Pelvic exam was performed with patient supine.     Vagina, uterus and rectum normal.     No lesions in the vagina.     No vaginal bleeding.     No cervical motion tenderness, friability, lesion or polyp.     Uterus is mobile.     Uterus is not enlarged.     No uterine mass detected.    Uterus is midaxial.     No right or left adnexal mass present.     Right adnexa not tender.     Left adnexa not tender.  HENT:     Head: Normocephalic and atraumatic. No laceration.     Right Ear: Hearing normal.     Left Ear: Hearing normal.     Mouth/Throat:     Pharynx: Uvula midline.  Eyes:     Pupils: Pupils are equal, round, and reactive to light.  Neck:     Musculoskeletal: Normal range of motion and neck supple.     Thyroid: No thyromegaly.  Cardiovascular:     Rate and Rhythm: Normal rate and regular rhythm.     Heart sounds: No murmur. No friction rub. No gallop.   Pulmonary:     Effort: Pulmonary effort is normal. No respiratory distress.     Breath sounds: Normal breath sounds. No wheezing.  Chest:     Breasts:        Right: No mass, skin change or tenderness.        Left: No mass, skin change or tenderness.  Abdominal:     General: Bowel sounds are normal. There is no distension.     Palpations: Abdomen is soft.     Tenderness: There is no abdominal tenderness. There is no rebound.  Musculoskeletal: Normal range of motion.  Neurological:     Mental Status: [name] and oriented to person, place, and time.     Cranial Nerves: No cranial nerve deficit.  Skin:    General: Skin is warm and dry.  Psychiatric:        Judgment: Judgment normal.  Vitals signs reviewed.   Assessment:  ANNUAL EXAM 1. Women's annual routine gynecological examination   2. CIN I (cervical intraepithelial neoplasia I)   3.  Screening for breast cancer    Screening Plan:            1.  Cervical Screening-  Pap smear done today as she has h/o CIN I  2. Breast screening- Exam annually and mammogram>40 planned   3. Labs managed by PCP  4. Counseling for contraception:  no method  Pt desires to try for pregnancy w husband who has no children, considering adoption if not able to get pregnant. Labs today to assess hormone levels as she has been off of Nexplanon for 6 weeks If menopausal range then low likelihood for fertility, pt aware of this possibility    F/U  Return in about 1 year (around 10/25/2019) for Annual.  Barnett Applebaum, MD, [facility], [facility] 10/24/2018  9:18 AM

## 2018-10-25 LAB — ESTRADIOL: Estradiol: 126.1 pg/mL

## 2018-10-25 LAB — FSH/LH
FSH: 8.2 m[IU]/mL
LH: 10.6 m[IU]/mL

## 2018-10-27 LAB — CYTOLOGY - PAP
DIAGNOSIS: NEGATIVE
HPV (WINDOPATH): NOT DETECTED

## 2018-11-04 ENCOUNTER — Encounter: Payer: Self-pay | Admitting: Obstetrics & Gynecology

## 2018-11-04 DIAGNOSIS — Z111 Encounter for screening for respiratory tuberculosis: Secondary | ICD-10-CM

## 2018-11-17 ENCOUNTER — Encounter: Payer: Self-pay | Admitting: Family Medicine

## 2018-11-18 ENCOUNTER — Ambulatory Visit: Payer: Commercial Managed Care - PPO | Admitting: Family Medicine

## 2018-11-18 ENCOUNTER — Encounter: Payer: Self-pay | Admitting: Family Medicine

## 2018-11-18 VITALS — BP 160/88 | HR 86 | Temp 98.9°F | Wt 204.6 lb

## 2018-11-18 DIAGNOSIS — F419 Anxiety disorder, unspecified: Secondary | ICD-10-CM

## 2018-11-18 DIAGNOSIS — K582 Mixed irritable bowel syndrome: Secondary | ICD-10-CM | POA: Diagnosis not present

## 2018-11-18 DIAGNOSIS — I10 Essential (primary) hypertension: Secondary | ICD-10-CM | POA: Diagnosis not present

## 2018-11-18 DIAGNOSIS — Z0000 Encounter for general adult medical examination without abnormal findings: Secondary | ICD-10-CM | POA: Diagnosis not present

## 2018-11-18 LAB — URINALYSIS, ROUTINE W REFLEX MICROSCOPIC
BILIRUBIN UA: NEGATIVE
Glucose, UA: NEGATIVE
KETONES UA: NEGATIVE
Leukocytes, UA: NEGATIVE
Nitrite, UA: NEGATIVE
Protein, UA: NEGATIVE
RBC, UA: NEGATIVE
SPEC GRAV UA: 1.025 (ref 1.005–1.030)
Urobilinogen, Ur: 0.2 mg/dL (ref 0.2–1.0)
pH, UA: 6 (ref 5.0–7.5)

## 2018-11-18 MED ORDER — LINACLOTIDE 145 MCG PO CAPS: 145.0000 ug | ORAL_CAPSULE | Freq: Every day | ORAL | 4 refills | Status: DC

## 2018-11-18 MED ORDER — PANTOPRAZOLE SODIUM 40 MG PO TBEC: 40.0000 mg | DELAYED_RELEASE_TABLET | Freq: Every day | ORAL | 4 refills | Status: DC

## 2018-11-18 MED ORDER — CLONAZEPAM 1 MG PO TABS: 1.0000 mg | ORAL_TABLET | Freq: Every day | ORAL | 4 refills | Status: DC | PRN

## 2018-11-18 NOTE — Assessment & Plan Note (Signed)
Patient working on diet exercise nutrition weight loss

## 2018-11-18 NOTE — Assessment & Plan Note (Signed)
Discussed hypertension poor control here in the office today on chart review patient's blood pressures been controlled.  Will review hypertension and patient will start lifestyle changes with diet exercise weight loss modest salt and follow blood pressure if not coming down will recheck.

## 2018-11-18 NOTE — Assessment & Plan Note (Signed)
We will continue clonazepam prescription patient will continue to work to taper her medications.

## 2018-11-18 NOTE — Assessment & Plan Note (Signed)
The current medical regimen is effective;  continue present plan and medications. a 

## 2018-11-18 NOTE — Progress Notes (Signed)
BP (!) 160/88 (BP Location: Left Arm)   Pulse 86   Temp 98.9 F (37.2 C) (Oral)   Wt 204 lb 9.6 oz (92.8 kg)   LMP 11/04/2018   SpO2 97%   BMI 36.24 kg/m    Subjective:    Patient ID: Victoria Shepard, female    DOB: 02/12/1971, 48 y.o.   MRN: [id number]  HPI: Victoria Shepard is a 48 y.o. female  Chief Complaint  Patient presents with  . Labs Only    pt requesting lab work, states she had physical at [facility]  Patient all in all doing really well.  Depression and life stressors have greatly resolved.  Patient's been able to stop citalopram and maintain her mood. Anxiety has improved also has been able to slow down clonazepam generally will take a half a day or every other day.  And prescription has lasted longer than anticipated.  Patient is working to continue to decrease medications. Patient also with chronic constipation takes Lizness on a faithful basis which works well. Reflux also doing well. Wants HIV test.  Along with other blood work for physical.  Patient had Pap mammogram etc. through GYN.  Relevant past medical, surgical, family and social history reviewed and updated as indicated. Interim medical history since our last visit reviewed. Allergies and medications reviewed and updated.  Review of Systems  Constitutional: Negative.   HENT: Negative.   Eyes: Negative.   Respiratory: Negative.   Cardiovascular: Negative.   Gastrointestinal: Negative.   Endocrine: Negative.   Genitourinary: Negative.   Musculoskeletal: Negative.   Skin: Negative.   Allergic/Immunologic: Negative.   Neurological: Negative.   Hematological: Negative.   Psychiatric/Behavioral: Negative.     Per HPI unless specifically indicated above     Objective:    BP (!) 160/88 (BP Location: Left Arm)   Pulse 86   Temp 98.9 F (37.2 C) (Oral)   Wt 204 lb 9.6 oz (92.8 kg)   LMP 11/04/2018   SpO2 97%   BMI 36.24 kg/m   Wt Readings from Last 3 Encounters:  11/18/18 204 lb 9.6 oz (92.8 kg)    10/24/18 202 lb (91.6 kg)  09/19/18 195 lb (88.5 kg)    Physical Exam Constitutional:      Appearance: She is well-developed.  HENT:     Head: Normocephalic and atraumatic.  Eyes:     Conjunctiva/sclera: Conjunctivae normal.  Neck:     Musculoskeletal: Normal range of motion.  Cardiovascular:     Rate and Rhythm: Normal rate and regular rhythm.     Heart sounds: Normal heart sounds.  Pulmonary:     Effort: Pulmonary effort is normal.     Breath sounds: Normal breath sounds.  Musculoskeletal: Normal range of motion.  Skin:    Findings: No erythema.  Neurological:     Mental Status: [name] and oriented to person, place, and time.  Psychiatric:        Behavior: Behavior normal.        Thought Content: Thought content normal.        Judgment: Judgment normal.         Assessment & Plan:   Problem List Items Addressed This Visit      Cardiovascular and Mediastinum   Hypertension    Discussed hypertension poor control here in the office today on chart review patient's blood pressures been controlled.  Will review hypertension and patient will start lifestyle changes with diet exercise weight loss modest salt  and follow blood pressure if not coming down will recheck.        Digestive   IBS (irritable bowel syndrome)    The current medical regimen is effective;  continue present plan and medications. a      Relevant Medications   linaclotide (LINZESS) 145 MCG CAPS capsule   pantoprazole (PROTONIX) 40 MG tablet     Other   Anxiety    We will continue clonazepam prescription patient will continue to work to taper her medications.      Relevant Medications   clonazePAM (KLONOPIN) 1 MG tablet   Morbid obesity ([place])    Patient working on diet exercise nutrition weight loss       Other Visit Diagnoses    PE (physical exam), annual    -  Primary   Relevant Orders   HIV Antibody (routine testing w rflx)   Comprehensive metabolic panel   Lipid panel   CBC  with Differential/Platelet   TSH   Urinalysis, Routine w reflex microscopic       Follow up plan: Return in about 6 months (around 05/19/2019) for Anxiety check.

## 2018-11-19 ENCOUNTER — Encounter: Payer: Self-pay | Admitting: Family Medicine

## 2018-11-19 LAB — COMPREHENSIVE METABOLIC PANEL
ALT: 28 IU/L (ref 0–32)
AST: 21 IU/L (ref 0–40)
Albumin/Globulin Ratio: 1.8 (ref 1.2–2.2)
Albumin: 4.5 g/dL (ref 3.8–4.8)
Alkaline Phosphatase: 62 IU/L (ref 39–117)
BUN/Creatinine Ratio: 14 (ref 9–23)
BUN: 11 mg/dL (ref 6–24)
Bilirubin Total: 0.7 mg/dL (ref 0.0–1.2)
CHLORIDE: 101 mmol/L (ref 96–106)
CO2: 23 mmol/L (ref 20–29)
Calcium: 8.9 mg/dL (ref 8.7–10.2)
Creatinine, Ser: 0.8 mg/dL (ref 0.57–1.00)
GFR calc Af Amer: 102 mL/min/{1.73_m2} (ref >59)
GFR, EST NON AFRICAN AMERICAN: 89 mL/min/{1.73_m2} (ref >59)
Globulin, Total: 2.5 g/dL (ref 1.5–4.5)
Glucose: 107 mg/dL — ABNORMAL HIGH (ref 65–99)
POTASSIUM: 4.5 mmol/L (ref 3.5–5.2)
Sodium: 141 mmol/L (ref 134–144)
TOTAL PROTEIN: 7 g/dL (ref 6.0–8.5)

## 2018-11-19 LAB — LIPID PANEL
Chol/HDL Ratio: 4.1 ratio (ref 0.0–4.4)
Cholesterol, Total: 189 mg/dL (ref 100–199)
HDL: 46 mg/dL (ref >39)
LDL Calculated: 117 mg/dL — ABNORMAL HIGH (ref 0–99)
Triglycerides: 132 mg/dL (ref 0–149)
VLDL Cholesterol Cal: 26 mg/dL (ref 5–40)

## 2018-11-19 LAB — HIV ANTIBODY (ROUTINE TESTING W REFLEX): HIV Screen 4th Generation wRfx: NONREACTIVE

## 2018-11-19 LAB — CBC WITH DIFFERENTIAL/PLATELET
Basophils Absolute: 0.1 10*3/uL (ref 0.0–0.2)
Basos: 1 %
EOS (ABSOLUTE): 0.1 10*3/uL (ref 0.0–0.4)
EOS: 2 %
Hematocrit: 43.6 % (ref 34.0–46.6)
Hemoglobin: 14.4 g/dL (ref 11.1–15.9)
Immature Grans (Abs): 0 10*3/uL (ref 0.0–0.1)
Immature Granulocytes: 0 %
LYMPHS: 28 %
Lymphocytes Absolute: 2 10*3/uL (ref 0.7–3.1)
MCH: 29.5 pg (ref 26.6–33.0)
MCHC: 33 g/dL (ref 31.5–35.7)
MCV: 89 fL (ref 79–97)
Monocytes Absolute: 0.5 10*3/uL (ref 0.1–0.9)
Monocytes: 7 %
Neutrophils Absolute: 4.5 10*3/uL (ref 1.4–7.0)
Neutrophils: 62 %
Platelets: 331 10*3/uL (ref 150–450)
RBC: 4.88 x10E6/uL (ref 3.77–5.28)
RDW: 12.6 % (ref 11.7–15.4)
WBC: 7.2 10*3/uL (ref 3.4–10.8)

## 2018-11-19 LAB — TSH: TSH: 3.21 u[IU]/mL (ref 0.450–4.500)

## 2018-12-01 ENCOUNTER — Telehealth: Payer: Self-pay | Admitting: Family Medicine

## 2018-12-01 DIAGNOSIS — Z87898 Personal history of other specified conditions: Secondary | ICD-10-CM

## 2018-12-01 NOTE — Telephone Encounter (Signed)
Copied from [place] [phone]. Topic: General - Other >> Nov 28, 2018 10:45 AM Windy Kalata wrote: Reason for CRM: Patient states that she needs an order for a diagnostic mammogram versus a regular mammogram per imaging  Best call back is [phone]

## 2018-12-01 NOTE — Telephone Encounter (Signed)
Future order placed, please let her know she can come in at any time to get that drawn. UTD on CPE  Copied from [id number] [phone]. Topic: General - Other >> Dec 01, 2018 12:32 PM Windy Kalata wrote: Reason for CRM: Patient needs to get a [place] lab test done for her job  Best call back is [phone] >> Dec 01, 2018  2:04 PM Shaune Pollack wrote: Madaline Brilliant to call patient to schedule? Please advise   Thanks

## 2018-12-02 ENCOUNTER — Telehealth: Payer: Self-pay

## 2018-12-02 NOTE — Telephone Encounter (Signed)
Called pt and she stated that she would have to have the actual TB skin test so she would go somewhere else to get it done.

## 2018-12-02 NOTE — Telephone Encounter (Signed)
Pt needs proof of Flu vaccine.  Printed and given to pt.

## 2018-12-09 ENCOUNTER — Other Ambulatory Visit: Payer: Self-pay | Admitting: Family Medicine

## 2018-12-09 DIAGNOSIS — N6320 Unspecified lump in the left breast, unspecified quadrant: Secondary | ICD-10-CM

## 2018-12-17 ENCOUNTER — Ambulatory Visit: Payer: Self-pay | Admitting: Family Medicine

## 2018-12-18 ENCOUNTER — Ambulatory Visit
Admission: RE | Admit: 2018-12-18 | Discharge: 2018-12-18 | Disposition: A | Discharge status: Home / Self Care | Payer: Commercial Managed Care - PPO | Source: Ambulatory Visit | Attending: Family Medicine | Admitting: Family Medicine

## 2018-12-18 DIAGNOSIS — N6320 Unspecified lump in the left breast, unspecified quadrant: Secondary | ICD-10-CM

## 2019-05-04 ENCOUNTER — Other Ambulatory Visit: Payer: Self-pay

## 2019-05-04 ENCOUNTER — Encounter: Payer: Self-pay | Admitting: Family Medicine

## 2019-05-04 ENCOUNTER — Ambulatory Visit (INDEPENDENT_AMBULATORY_CARE_PROVIDER_SITE_OTHER): Payer: Commercial Managed Care - PPO | Admitting: Family Medicine

## 2019-05-04 DIAGNOSIS — R6883 Chills (without fever): Secondary | ICD-10-CM | POA: Diagnosis not present

## 2019-05-04 DIAGNOSIS — Z20822 Contact with and (suspected) exposure to covid-19: Secondary | ICD-10-CM

## 2019-05-04 DIAGNOSIS — R52 Pain, unspecified: Secondary | ICD-10-CM

## 2019-05-04 DIAGNOSIS — R059 Cough, unspecified: Secondary | ICD-10-CM

## 2019-05-04 DIAGNOSIS — R05 Cough: Secondary | ICD-10-CM

## 2019-05-04 MED ORDER — BENZONATATE 200 MG PO CAPS: 200.0000 mg | ORAL_CAPSULE | Freq: Three times a day (TID) | ORAL | 0 refills | Status: DC | PRN

## 2019-05-04 MED ORDER — ALBUTEROL SULFATE HFA 108 (90 BASE) MCG/ACT IN AERS: 2.0000 | INHALATION_SPRAY | Freq: Four times a day (QID) | RESPIRATORY_TRACT | 0 refills | Status: DC | PRN

## 2019-05-04 NOTE — Progress Notes (Signed)
There were no vitals taken for this visit.   Subjective:    Patient ID: Victoria Shepard, female    DOB: Jun 19, 1971, 48 y.o.   MRN: [id number]  HPI: Victoria Shepard is a 48 y.o. female  Chief Complaint  Patient presents with  . Sore Throat    Symtpoms started yesterday  . Generalized Body Aches  . Chills    Does not have a thermometer, but is having hot/cold flashes, feet sweating.   . Chest Pain    Tingling/squeezing feel; upper part of chest. Pt never felt this pain before.     . This visit was completed via WebEx due to the restrictions of the COVID-19 pandemic. All issues as above were discussed and addressed. Physical exam was done as above through visual confirmation on WebEx. If it was felt that the patient should be evaluated in the office, they were directed there. The patient verbally consented to this visit. . Location of the patient: home . Location of the provider: home . Those involved with this call:  . Provider: Merrie Roof, PA-C . CMA: Merilyn Baba, [place] . Front Desk/Registration: Jill Side  . Time spent on call: 25 minutes with patient face to face via video conference. More than 50% of this time was spent in counseling and coordination of care. 5 minutes total spent in review of patient's record and preparation of their chart. I verified patient identity using two factors (patient name and date of birth). Patient consents verbally to being seen via telemedicine visit today.   Congestion, cough, tingling in chest, generalized body aches, chills, sweats, funny feeling in chest x 1-2 days. Denies wheezing, SOB, N/V/D. Taking OTC cold medication without relief. No known sick contacts, recent travel.   Relevant past medical, surgical, family and social history reviewed and updated as indicated. Interim medical history since our last visit reviewed. Allergies and medications reviewed and updated.  Review of Systems  Per HPI unless specifically indicated above      Objective:    There were no vitals taken for this visit.  Wt Readings from Last 3 Encounters:  11/18/18 204 lb 9.6 oz (92.8 kg)  10/24/18 202 lb (91.6 kg)  09/19/18 195 lb (88.5 kg)    Physical Exam Vitals signs and nursing note reviewed.  Constitutional:      General: She is not in acute distress.    Appearance: Normal appearance.  HENT:     Head: Atraumatic.     Right Ear: External ear normal.     Left Ear: External ear normal.     Nose: Rhinorrhea present.     Mouth/Throat:     Mouth: Mucous membranes are moist.     Pharynx: Oropharynx is clear. Posterior oropharyngeal erythema present.  Eyes:     Extraocular Movements: Extraocular movements intact.     Conjunctiva/sclera: Conjunctivae normal.  Neck:     Musculoskeletal: Normal range of motion.  Cardiovascular:     Comments: Unable to assess via virtual visit Pulmonary:     Effort: Pulmonary effort is normal. No respiratory distress.  Musculoskeletal: Normal range of motion.  Skin:    General: Skin is dry.     Findings: No erythema.  Neurological:     Mental Status: [name] and oriented to person, place, and time.  Psychiatric:        Thought Content: Thought content normal.        Judgment: Judgment normal.     Comments: Tearful, anxious  Results for orders placed or performed in visit on 11/18/18  HIV Antibody (routine testing w rflx)  Result Value Ref Range   HIV Screen 4th Generation wRfx Non Reactive Non Reactive  Comprehensive metabolic panel  Result Value Ref Range   Glucose 107 (H) 65 - 99 mg/dL   BUN 11 6 - 24 mg/dL   Creatinine, Ser 0.80 0.57 - 1.00 mg/dL   GFR calc non Af Amer 89 >59 mL/min/1.73   GFR calc Af Amer 102 >59 mL/min/1.73   BUN/Creatinine Ratio 14 9 - 23   Sodium 141 134 - 144 mmol/L   Potassium 4.5 3.5 - 5.2 mmol/L   Chloride 101 96 - 106 mmol/L   CO2 23 20 - 29 mmol/L   Calcium 8.9 8.7 - 10.2 mg/dL   Total Protein 7.0 6.0 - 8.5 g/dL   Albumin 4.5 3.8 - 4.8 g/dL    Globulin, Total 2.5 1.5 - 4.5 g/dL   Albumin/Globulin Ratio 1.8 1.2 - 2.2   Bilirubin Total 0.7 0.0 - 1.2 mg/dL   Alkaline Phosphatase 62 39 - 117 IU/L   AST 21 0 - 40 IU/L   ALT 28 0 - 32 IU/L  Lipid panel  Result Value Ref Range   Cholesterol, Total 189 100 - 199 mg/dL   Triglycerides 132 0 - 149 mg/dL   HDL 46 >39 mg/dL   VLDL Cholesterol Cal 26 5 - 40 mg/dL   LDL Calculated 117 (H) 0 - 99 mg/dL   Chol/HDL Ratio 4.1 0.0 - 4.4 ratio  CBC with Differential/Platelet  Result Value Ref Range   WBC 7.2 3.4 - 10.8 x10E3/uL   RBC 4.88 3.77 - 5.28 x10E6/uL   Hemoglobin 14.4 11.1 - 15.9 g/dL   Hematocrit 43.6 34.0 - 46.6 %   MCV 89 79 - 97 fL   MCH 29.5 26.6 - 33.0 pg   MCHC 33.0 31.5 - 35.7 g/dL   RDW 12.6 11.7 - 15.4 %   Platelets 331 150 - 450 x10E3/uL   Neutrophils 62 Not Estab. %   Lymphs 28 Not Estab. %   Monocytes 7 Not Estab. %   Eos 2 Not Estab. %   Basos 1 Not Estab. %   Neutrophils Absolute 4.5 1.4 - 7.0 x10E3/uL   Lymphocytes Absolute 2.0 0.7 - 3.1 x10E3/uL   Monocytes Absolute 0.5 0.1 - 0.9 x10E3/uL   EOS (ABSOLUTE) 0.1 0.0 - 0.4 x10E3/uL   Basophils Absolute 0.1 0.0 - 0.2 x10E3/uL   Immature Granulocytes 0 Not Estab. %   Immature Grans (Abs) 0.0 0.0 - 0.1 x10E3/uL  TSH  Result Value Ref Range   TSH 3.210 0.450 - 4.500 uIU/mL  Urinalysis, Routine w reflex microscopic  Result Value Ref Range   Specific Gravity, UA 1.025 1.005 - 1.030   pH, UA 6.0 5.0 - 7.5   Color, UA Yellow Yellow   Appearance Ur Clear Clear   Leukocytes, UA Negative Negative   Protein, UA Negative Negative/Trace   Glucose, UA Negative Negative   Ketones, UA Negative Negative   RBC, UA Negative Negative   Bilirubin, UA Negative Negative   Urobilinogen, Ur 0.2 0.2 - 1.0 mg/dL   Nitrite, UA Negative Negative      Assessment & Plan:   Problem List Items Addressed This Visit    None    Visit Diagnoses    Cough    -  Primary   Relevant Orders   Novel Coronavirus, NAA ([facility])    Chills  Relevant Orders   Novel Coronavirus, NAA ([facility])   Body aches       Relevant Orders   Novel Coronavirus, NAA ([facility])    Sxs suspicious for COVID 19 infection, will refer for testing and provide documentation to quarantine at home for 2 weeks or until negative results and sxs resolve. Supportive care reviewed,  Tessalon perles given for symptomatic relief. Discussed to go to ER if chest tightness worsens or she starts having difficulty breathing. Patient significantly anxious about her current sxs. Discussed to send updates as needed.   Follow up plan: Return in about 1 week (around 05/11/2019) for Sick f/u.

## 2019-05-06 ENCOUNTER — Encounter: Payer: Self-pay | Admitting: Family Medicine

## 2019-05-06 LAB — NOVEL CORONAVIRUS, NAA: SARS-CoV-2, NAA: NOT DETECTED

## 2019-05-07 ENCOUNTER — Encounter: Payer: Self-pay | Admitting: Family Medicine

## 2019-05-08 ENCOUNTER — Other Ambulatory Visit: Payer: Self-pay | Admitting: Family Medicine

## 2019-05-08 MED ORDER — ALBUTEROL SULFATE HFA 108 (90 BASE) MCG/ACT IN AERS: 2.0000 | INHALATION_SPRAY | Freq: Four times a day (QID) | RESPIRATORY_TRACT | 0 refills | Status: DC | PRN

## 2019-05-08 NOTE — Progress Notes (Signed)
albu

## 2019-05-11 ENCOUNTER — Encounter: Payer: Self-pay | Admitting: Family Medicine

## 2019-05-11 ENCOUNTER — Ambulatory Visit (INDEPENDENT_AMBULATORY_CARE_PROVIDER_SITE_OTHER): Payer: Commercial Managed Care - PPO | Admitting: Family Medicine

## 2019-05-11 ENCOUNTER — Other Ambulatory Visit: Payer: Self-pay

## 2019-05-11 VITALS — Ht 63.0 in | Wt 175.0 lb

## 2019-05-11 DIAGNOSIS — J069 Acute upper respiratory infection, unspecified: Secondary | ICD-10-CM

## 2019-05-11 MED ORDER — AZITHROMYCIN 250 MG PO TABS: ORAL_TABLET | ORAL | 0 refills | Status: DC

## 2019-05-11 NOTE — Progress Notes (Signed)
Ht 5\' 3"  (1.6 m)   Wt 175 lb (79.4 kg)   BMI 31.00 kg/m    Subjective:    Patient ID: Victoria Shepard, female    DOB: 1971/10/15, 48 y.o.   MRN: [id number]  HPI: Victoria Shepard is a 48 y.o. female  Chief Complaint  Patient presents with  . Chills    COVID19 test negative. Still having symptoms.   . Nasal Congestion  . Emesis  . Sore Throat  . Generalized Body Aches  . Headache    . This visit was completed via WebEx due to the restrictions of the COVID-19 pandemic. All issues as above were discussed and addressed. Physical exam was done as above through visual confirmation on WebEx. If it was felt that the patient should be evaluated in the office, they were directed there. The patient verbally consented to this visit. . Location of the patient: home . Location of the provider: home . Those involved with this call:  . Provider: Merrie Roof, PA-C . CMA: Merilyn Baba, [place] . Front Desk/Registration: Jill Side  . Time spent on call: 25 minutes with patient face to face via video conference. More than 50% of this time was spent in counseling and coordination of care. 5 minutes total spent in review of patient's record and preparation of their chart. I verified patient identity using two factors (patient name and date of birth). Patient consents verbally to being seen via telemedicine visit today.   Patient presenting today for 1 week f/u for sick sxs/suspected COVID 19 infection. Her test came back negative, but still having significant consistent sxs. Cough, sore throat, chills, generalized aches, sweats, SOB, weakness. Albuterol and tessalon are helping some. Denies respiratory distress, uncontrollable fevers, CP, V/D. Has been quarantined at home since onset.   Relevant past medical, surgical, family and social history reviewed and updated as indicated. Interim medical history since our last visit reviewed. Allergies and medications reviewed and updated.  Review of Systems   Per HPI unless specifically indicated above     Objective:    Ht 5\' 3"  (1.6 m)   Wt 175 lb (79.4 kg)   BMI 31.00 kg/m   Wt Readings from Last 3 Encounters:  05/11/19 175 lb (79.4 kg)  11/18/18 204 lb 9.6 oz (92.8 kg)  10/24/18 202 lb (91.6 kg)    Physical Exam Vitals signs and nursing note reviewed.  Constitutional:      General: She is not in acute distress.    Appearance: Normal appearance.  HENT:     Head: Atraumatic.     Right Ear: External ear normal.     Left Ear: External ear normal.     Nose: Congestion present.     Mouth/Throat:     Mouth: Mucous membranes are moist.     Pharynx: Oropharynx is clear. No posterior oropharyngeal erythema.  Eyes:     Extraocular Movements: Extraocular movements intact.     Conjunctiva/sclera: Conjunctivae normal.  Neck:     Musculoskeletal: Normal range of motion.  Cardiovascular:     Comments: Unable to assess via virtual visit Pulmonary:     Effort: Pulmonary effort is normal. No respiratory distress.  Musculoskeletal: Normal range of motion.  Skin:    General: Skin is dry.     Findings: No erythema.  Neurological:     Mental Status: [name] and oriented to person, place, and time.  Psychiatric:        Mood and Affect: Mood  normal.        Thought Content: Thought content normal.        Judgment: Judgment normal.     Results for orders placed or performed in visit on 05/04/19  Novel Coronavirus, NAA ([facility])  Result Value Ref Range   SARS-CoV-2, NAA Not Detected Not Detected      Assessment & Plan:   Problem List Items Addressed This Visit    None    Visit Diagnoses    Upper respiratory tract infection, unspecified type    -  Primary   Continue quarantine given persistent sxs. Tx with zpak in case secondary bacterial infection prolonging sxs at this point. F/u in 3-4 days for recheck   Relevant Medications   azithromycin (ZITHROMAX) 250 MG tablet       Follow up plan: Return in about 4 days (around  05/15/2019) for sick f/u.

## 2019-05-14 ENCOUNTER — Encounter: Payer: Self-pay | Admitting: Family Medicine

## 2019-05-14 ENCOUNTER — Other Ambulatory Visit: Payer: Self-pay | Admitting: Family Medicine

## 2019-05-14 MED ORDER — ALBUTEROL SULFATE HFA 108 (90 BASE) MCG/ACT IN AERS: 2.0000 | INHALATION_SPRAY | Freq: Four times a day (QID) | RESPIRATORY_TRACT | 0 refills | Status: DC | PRN

## 2019-05-20 ENCOUNTER — Encounter: Payer: Self-pay | Admitting: Family Medicine

## 2019-05-26 ENCOUNTER — Encounter: Payer: Self-pay | Admitting: Family Medicine

## 2019-05-26 ENCOUNTER — Other Ambulatory Visit: Payer: Self-pay | Admitting: Family Medicine

## 2019-05-26 DIAGNOSIS — F419 Anxiety disorder, unspecified: Secondary | ICD-10-CM

## 2019-05-26 MED ORDER — CLONAZEPAM 1 MG PO TABS: 1.0000 mg | ORAL_TABLET | Freq: Every day | ORAL | 4 refills | Status: DC | PRN

## 2019-06-03 ENCOUNTER — Ambulatory Visit: Payer: Self-pay | Admitting: *Deleted

## 2019-06-03 NOTE — Telephone Encounter (Signed)
Called pt to schedule, no answer, left vm to call us back

## 2019-06-03 NOTE — Telephone Encounter (Signed)
Pt called with having dizziness that started today. She stated the room is spinning at times. She feel worst when she changes her head position and stands up. She has had a sinus infection and had to have a root canal within the last month. She has also been on 3 different antibiotics at that time. She denies a fever, stuffy nose, weakness, nausea or vomiting or headache. She is requesting an appointment for today. Notified [facility], no available appointment today, possibly virtual tomorrow. Advised pt that she should be seen at an urgent care. She would like to have the virtual appointment if available for tomorrow. Per protocol, she needs to be seen within 24 hours.  She is advised to go to the hospital if she has a severe headache, vomiting, or weakness in her extremities. She is encourage to stay hydrated.  She voiced understanding. Routing to the practice for a virtual appointment for tomorrow.  Reason for Disposition . [1] MODERATE dizziness (e.g., vertigo; feels very unsteady, interferes with normal activities) AND [2] has NOT been evaluated by physician for this  Answer Assessment - Initial Assessment Questions 1. DESCRIPTION: "Describe your dizziness."     Room spinning 2. VERTIGO: "Do you feel like either you or the room is spinning or tilting?"      yes 3. LIGHTHEADED: "Do you feel lightheaded?" (e.g., somewhat faint, woozy, weak upon standing)     Weak upon standing 4. SEVERITY: "How bad is it?"  "Can you walk?"   - MILD - Feels unsteady but walking normally.   - MODERATE - Feels very unsteady when walking, but not falling; interferes with normal activities (e.g., school, work) .   - SEVERE - Unable to walk without falling (requires assistance).     Mild to moderate 5. ONSET:  "When did the dizziness begin?"     This morning 6. AGGRAVATING FACTORS: "Does anything make it worse?" (e.g., standing, change in head position)     Change in head position and  standing  7. CAUSE: "What do you think is causing the dizziness?"     no 8. RECURRENT SYMPTOM: "Have you had dizziness before?" If so, ask: "When was the last time?" "What happened that time?"     no 9. OTHER SYMPTOMS: "Do you have any other symptoms?" (e.g., headache, weakness, numbness, vomiting, earache)     no 10. PREGNANCY: "Is there any chance you are pregnant?" "When was your last menstrual period?"       LMP just finished 2 days ago  Protocols used: DIZZINESS - VERTIGO-A-AH

## 2019-06-04 ENCOUNTER — Encounter: Payer: Self-pay | Admitting: Family Medicine

## 2019-06-04 ENCOUNTER — Other Ambulatory Visit: Payer: Self-pay

## 2019-06-04 ENCOUNTER — Ambulatory Visit (INDEPENDENT_AMBULATORY_CARE_PROVIDER_SITE_OTHER): Payer: Commercial Managed Care - PPO | Admitting: Family Medicine

## 2019-06-04 DIAGNOSIS — J329 Chronic sinusitis, unspecified: Secondary | ICD-10-CM

## 2019-06-04 DIAGNOSIS — F419 Anxiety disorder, unspecified: Secondary | ICD-10-CM | POA: Diagnosis not present

## 2019-06-04 DIAGNOSIS — Z20822 Contact with and (suspected) exposure to covid-19: Secondary | ICD-10-CM

## 2019-06-04 MED ORDER — DOXYCYCLINE HYCLATE 100 MG PO TABS: 100.0000 mg | ORAL_TABLET | Freq: Two times a day (BID) | ORAL | 0 refills | Status: DC

## 2019-06-04 MED ORDER — BENZONATATE 200 MG PO CAPS: 200.0000 mg | ORAL_CAPSULE | Freq: Three times a day (TID) | ORAL | 0 refills | Status: DC | PRN

## 2019-06-04 MED ORDER — FLUTICASONE PROPIONATE 50 MCG/ACT NA SUSP: 2.0000 | Freq: Every day | NASAL | 6 refills | Status: DC

## 2019-06-04 NOTE — Assessment & Plan Note (Signed)
Reviewed anxiety concerns and condition continue current medication

## 2019-06-04 NOTE — Progress Notes (Addendum)
   There were no vitals taken for this visit.   Subjective:    Patient ID: Victoria Shepard, female    DOB: [birth year], 48 y.o.   MRN: [id number]  HPI: Victoria Shepard is a 48 y.o. female  Feeling bad and dizzy Discussed with patient sinus pressure congestion drainage feeling bad is been ongoing for several weeks getting worse over this last several days with development of dizziness sinus pressure feeling bad generalized achiness and fatigue.  No other systemic symptoms of fever chills. Patient had negative COVID-19 test last month Patient is exposed to a lot of different people as she works as a Theme park manager. Patient also bothered by a great deal of anxiety which was discussed.  Relevant past medical, surgical, family and social history reviewed and updated as indicated. Interim medical history since our last visit reviewed. Allergies and medications reviewed and updated.  Review of Systems  Constitutional: Positive for fatigue. Negative for fever.  Respiratory: Positive for cough.   Cardiovascular: Negative.     Per HPI unless specifically indicated above     Objective:    There were no vitals taken for this visit.  Wt Readings from Last 3 Encounters:  05/11/19 175 lb (79.4 kg)  11/18/18 204 lb 9.6 oz (92.8 kg)  10/24/18 202 lb (91.6 kg)    Physical Exam  Results for orders placed or performed in visit on 05/04/19  Novel Coronavirus, NAA ([facility])  Result Value Ref Range   SARS-CoV-2, NAA Not Detected Not Detected      Assessment & Plan:   Problem List Items Addressed This Visit      Respiratory   Sinusitis    Discussed sinusitis possibly causing patient's vertigo but also in response to allergies. Discussed allergy treatment with Flonase Use of doxycycline plus cautions given about sun sensitivity  Discussed getting another COVID-19 test especially for work      Relevant Medications   doxycycline (VIBRA-TABS) 100 MG tablet   fluticasone (FLONASE) 50 MCG/ACT  nasal spray   benzonatate (TESSALON) 200 MG capsule     Other   Anxiety    Reviewed anxiety concerns and condition continue current medication          Telemedicine using audio/video telecommunications for a synchronous communication visit. Today's visit due to COVID-19 isolation precautions I connected with and verified that I am speaking with the correct person using two identifiers.   I discussed the limitations, risks, security and privacy concerns of performing an evaluation and management service by telecommunication and the availability of in person appointments. I also discussed with the patient that there may be a patient responsible charge related to this service. The patient expressed understanding and agreed to proceed. The patient's location is home. I am at home.   I discussed the assessment and treatment plan with the patient. The patient was provided an opportunity to ask questions and all were answered. The patient agreed with the plan and demonstrated an understanding of the instructions.   The patient was advised to call back or seek an in-person evaluation if the symptoms worsen or if the condition fails to improve as anticipated.   I provided 21+ minutes of time during this encounter. Follow up plan: Return if symptoms worsen or fail to improve.

## 2019-06-04 NOTE — Assessment & Plan Note (Signed)
Discussed sinusitis possibly causing patient's vertigo but also in response to allergies. Discussed allergy treatment with Flonase Use of doxycycline plus cautions given about sun sensitivity  Discussed getting another COVID-19 test especially for work

## 2019-06-05 LAB — NOVEL CORONAVIRUS, NAA: SARS-CoV-2, NAA: NOT DETECTED

## 2019-06-08 ENCOUNTER — Emergency Department: Payer: Commercial Managed Care - PPO

## 2019-06-08 ENCOUNTER — Encounter: Payer: Self-pay | Admitting: Emergency Medicine

## 2019-06-08 ENCOUNTER — Other Ambulatory Visit: Payer: Self-pay

## 2019-06-08 ENCOUNTER — Emergency Department
Admission: EM | Admit: 2019-06-08 | Discharge: 2019-06-08 | Disposition: A | Discharge status: Home / Self Care | Payer: Commercial Managed Care - PPO | Attending: Emergency Medicine | Admitting: Emergency Medicine

## 2019-06-08 DIAGNOSIS — Z79899 Other long term (current) drug therapy: Secondary | ICD-10-CM | POA: Insufficient documentation

## 2019-06-08 DIAGNOSIS — Y998 Other external cause status: Secondary | ICD-10-CM | POA: Diagnosis not present

## 2019-06-08 DIAGNOSIS — Y9289 Other specified places as the place of occurrence of the external cause: Secondary | ICD-10-CM | POA: Insufficient documentation

## 2019-06-08 DIAGNOSIS — S0993XA Unspecified injury of face, initial encounter: Secondary | ICD-10-CM | POA: Insufficient documentation

## 2019-06-08 DIAGNOSIS — S199XXA Unspecified injury of neck, initial encounter: Secondary | ICD-10-CM | POA: Insufficient documentation

## 2019-06-08 DIAGNOSIS — Y9389 Activity, other specified: Secondary | ICD-10-CM | POA: Diagnosis not present

## 2019-06-08 DIAGNOSIS — I10 Essential (primary) hypertension: Secondary | ICD-10-CM | POA: Insufficient documentation

## 2019-06-08 LAB — CBC WITH DIFFERENTIAL/PLATELET
Abs Immature Granulocytes: 0.02 10*3/uL (ref 0.00–0.07)
Basophils Absolute: 0.1 10*3/uL (ref 0.0–0.1)
Basophils Relative: 1 %
Eosinophils Absolute: 0 10*3/uL (ref 0.0–0.5)
Eosinophils Relative: 0 %
HCT: 43.5 % (ref 36.0–46.0)
Hemoglobin: 14.5 g/dL (ref 12.0–15.0)
Immature Granulocytes: 0 %
Lymphocytes Relative: 23 %
Lymphs Abs: 2 10*3/uL (ref 0.7–4.0)
MCH: 29.5 pg (ref 26.0–34.0)
MCHC: 33.3 g/dL (ref 30.0–36.0)
MCV: 88.4 fL (ref 80.0–100.0)
Monocytes Absolute: 0.6 10*3/uL (ref 0.1–1.0)
Monocytes Relative: 6 %
Neutro Abs: 6.4 10*3/uL (ref 1.7–7.7)
Neutrophils Relative %: 70 %
Platelets: 368 10*3/uL (ref 150–400)
RBC: 4.92 MIL/uL (ref 3.87–5.11)
RDW: 12.5 % (ref 11.5–15.5)
WBC: 9.1 10*3/uL (ref 4.0–10.5)
nRBC: 0 % (ref 0.0–0.2)

## 2019-06-08 LAB — COMPREHENSIVE METABOLIC PANEL
ALT: 28 U/L (ref 0–44)
AST: 26 U/L (ref 15–41)
Albumin: 4.4 g/dL (ref 3.5–5.0)
Alkaline Phosphatase: 62 U/L (ref 38–126)
Anion gap: 12 (ref 5–15)
BUN: 8 mg/dL (ref 6–20)
CO2: 25 mmol/L (ref 22–32)
Calcium: 9 mg/dL (ref 8.9–10.3)
Chloride: 101 mmol/L (ref 98–111)
Creatinine, Ser: 0.66 mg/dL (ref 0.44–1.00)
GFR calc Af Amer: >60 mL/min (ref >60)
GFR calc non Af Amer: >60 mL/min (ref >60)
Glucose, Bld: 101 mg/dL — ABNORMAL HIGH (ref 70–99)
Potassium: 3.6 mmol/L (ref 3.5–5.1)
Sodium: 138 mmol/L (ref 135–145)
Total Bilirubin: 1.7 mg/dL — ABNORMAL HIGH (ref 0.3–1.2)
Total Protein: 7.7 g/dL (ref 6.5–8.1)

## 2019-06-08 MED ORDER — METHOCARBAMOL 500 MG PO TABS: 500.0000 mg | ORAL_TABLET | Freq: Three times a day (TID) | ORAL | 0 refills | Status: AC | PRN

## 2019-06-08 MED ORDER — MELOXICAM 15 MG PO TABS: 15.0000 mg | ORAL_TABLET | Freq: Every day | ORAL | 1 refills | Status: AC

## 2019-06-08 MED ORDER — IOHEXOL 300 MG/ML  SOLN
75.0000 mL | Freq: Once | INTRAMUSCULAR | Status: AC | PRN
  Administered 2019-06-08: 75 mL via INTRAVENOUS
  Filled 2019-06-08: qty 75
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
## 2019-07-23 ENCOUNTER — Encounter: Payer: Self-pay | Admitting: Family Medicine

## 2019-07-23 ENCOUNTER — Other Ambulatory Visit: Payer: Self-pay

## 2019-07-23 ENCOUNTER — Ambulatory Visit (INDEPENDENT_AMBULATORY_CARE_PROVIDER_SITE_OTHER): Payer: Commercial Managed Care - PPO | Admitting: Family Medicine

## 2019-07-23 VITALS — BP 125/84 | HR 77

## 2019-07-23 DIAGNOSIS — Z20822 Contact with and (suspected) exposure to covid-19: Secondary | ICD-10-CM

## 2019-07-23 DIAGNOSIS — J0100 Acute maxillary sinusitis, unspecified: Secondary | ICD-10-CM | POA: Diagnosis not present

## 2019-07-23 DIAGNOSIS — R509 Fever, unspecified: Secondary | ICD-10-CM

## 2019-07-23 MED ORDER — FLUTICASONE PROPIONATE 50 MCG/ACT NA SUSP: 2.0000 | Freq: Every day | NASAL | 6 refills | Status: DC

## 2019-07-23 MED ORDER — AZITHROMYCIN 250 MG PO TABS: ORAL_TABLET | ORAL | 0 refills | Status: DC

## 2019-07-23 MED ORDER — LEVOCETIRIZINE DIHYDROCHLORIDE 5 MG PO TABS: 5.0000 mg | ORAL_TABLET | Freq: Every evening | ORAL | 1 refills | Status: DC

## 2019-07-23 NOTE — Progress Notes (Signed)
BP 125/84 Comment: pt reported  Pulse 77    Subjective:    Patient ID: Victoria Shepard, female    DOB: Dec 14, 1970, 48 y.o.   MRN: [id number]  HPI: Victoria Shepard is a 48 y.o. female  Chief Complaint  Patient presents with  . URI    pt states she has had congestion, headache and a fever for the last week     . This visit was completed via WebEx due to the restrictions of the COVID-19 pandemic. All issues as above were discussed and addressed. Physical exam was done as above through visual confirmation on WebEx. If it was felt that the patient should be evaluated in the office, they were directed there. The patient verbally consented to this visit. . Location of the patient: home . Location of the provider: work . Those involved with this call:  . Provider: Merrie Roof, PA-C . CMA: Yvonna Alanis, [place] . Front Desk/Registration: Jill Side  . Time spent on call: 25 minutes with patient face to face via video conference. More than 50% of this time was spent in counseling and coordination of care. 5 minutes total spent in review of patient's record and preparation of their chart. I verified patient identity using two factors (patient name and date of birth). Patient consents verbally to being seen via telemedicine visit today.   Severe HAs, congestion, sinus pain, diarrhea and pressure for about a week. Has been running a low grade fever, highest 100.5 but typically high 99 degree range. Using flonase and ibuprofen with mild temporary relief. No known sick contacts, recent travel, N/V, anorexia, CP, SOB. Taking OTC fever reducers with mild relief.   Relevant past medical, surgical, family and social history reviewed and updated as indicated. Interim medical history since our last visit reviewed. Allergies and medications reviewed and updated.  Review of Systems  Per HPI unless specifically indicated above     Objective:    BP 125/84 Comment: pt reported  Pulse 77   Wt  Readings from Last 3 Encounters:  06/08/19 201 lb (91.2 kg)  05/11/19 175 lb (79.4 kg)  11/18/18 204 lb 9.6 oz (92.8 kg)    Physical Exam Vitals signs and nursing note reviewed.  Constitutional:      General: She is not in acute distress.    Appearance: Normal appearance.  HENT:     Head: Atraumatic.     Right Ear: External ear normal.     Left Ear: External ear normal.     Nose: Congestion present.     Mouth/Throat:     Mouth: Mucous membranes are moist.     Pharynx: Oropharynx is clear. Posterior oropharyngeal erythema present.  Eyes:     Extraocular Movements: Extraocular movements intact.     Conjunctiva/sclera: Conjunctivae normal.  Neck:     Musculoskeletal: Normal range of motion.  Cardiovascular:     Comments: Unable to assess via virtual visit Pulmonary:     Effort: Pulmonary effort is normal. No respiratory distress.  Musculoskeletal: Normal range of motion.  Skin:    General: Skin is dry.     Findings: No erythema.  Neurological:     Mental Status: [name] and oriented to person, place, and time.  Psychiatric:        Mood and Affect: Mood normal.        Thought Content: Thought content normal.        Judgment: Judgment normal.     Results for orders  placed or performed during the hospital encounter of 06/08/19  CBC with Differential  Result Value Ref Range   WBC 9.1 4.0 - 10.5 K/uL   RBC 4.92 3.87 - 5.11 MIL/uL   Hemoglobin 14.5 12.0 - 15.0 g/dL   HCT 43.5 36.0 - 46.0 %   MCV 88.4 80.0 - 100.0 fL   MCH 29.5 26.0 - 34.0 pg   MCHC 33.3 30.0 - 36.0 g/dL   RDW 12.5 11.5 - 15.5 %   Platelets 368 150 - 400 K/uL   nRBC 0.0 0.0 - 0.2 %   Neutrophils Relative % 70 %   Neutro Abs 6.4 1.7 - 7.7 K/uL   Lymphocytes Relative 23 %   Lymphs Abs 2.0 0.7 - 4.0 K/uL   Monocytes Relative 6 %   Monocytes Absolute 0.6 0.1 - 1.0 K/uL   Eosinophils Relative 0 %   Eosinophils Absolute 0.0 0.0 - 0.5 K/uL   Basophils Relative 1 %   Basophils Absolute 0.1 0.0 - 0.1  K/uL   Immature Granulocytes 0 %   Abs Immature Granulocytes 0.02 0.00 - 0.07 K/uL  Comprehensive metabolic panel  Result Value Ref Range   Sodium 138 135 - 145 mmol/L   Potassium 3.6 3.5 - 5.1 mmol/L   Chloride 101 98 - 111 mmol/L   CO2 25 22 - 32 mmol/L   Glucose, Bld 101 (H) 70 - 99 mg/dL   BUN 8 6 - 20 mg/dL   Creatinine, Ser 0.66 0.44 - 1.00 mg/dL   Calcium 9.0 8.9 - 10.3 mg/dL   Total Protein 7.7 6.5 - 8.1 g/dL   Albumin 4.4 3.5 - 5.0 g/dL   AST 26 15 - 41 U/L   ALT 28 0 - 44 U/L   Alkaline Phosphatase 62 38 - 126 U/L   Total Bilirubin 1.7 (H) 0.3 - 1.2 mg/dL   GFR calc non Af Amer >60 >60 mL/min   GFR calc Af Amer >60 >60 mL/min   Anion gap 12 5 - 15      Assessment & Plan:   Problem List Items Addressed This Visit      Respiratory   Sinusitis - Primary    Having frequent flares, will start zyxal, flonase, sinus rinses, mucinex prn. Will also perform COVID 19 testing given fever and other sxs for r/o. Self quarantine in meantime until results are back and sxs improving. If no improvement over weekend, may start zpak. Monitor for relief.       Relevant Medications   levocetirizine (XYZAL) 5 MG tablet   fluticasone (FLONASE) 50 MCG/ACT nasal spray   azithromycin (ZITHROMAX) 250 MG tablet    Other Visit Diagnoses    Fever, unspecified fever cause           Follow up plan: Return if symptoms worsen or fail to improve.

## 2019-07-24 LAB — NOVEL CORONAVIRUS, NAA: SARS-CoV-2, NAA: NOT DETECTED

## 2019-07-27 NOTE — Assessment & Plan Note (Signed)
Having frequent flares, will start zyxal, flonase, sinus rinses, mucinex prn. Will also perform COVID 19 testing given fever and other sxs for r/o. Self quarantine in meantime until results are back and sxs improving. If no improvement over weekend, may start zpak. Monitor for relief.

## 2019-08-03 ENCOUNTER — Other Ambulatory Visit: Payer: Self-pay | Admitting: Family Medicine

## 2019-08-03 DIAGNOSIS — K582 Mixed irritable bowel syndrome: Secondary | ICD-10-CM

## 2019-08-03 MED ORDER — LINACLOTIDE 145 MCG PO CAPS: 145.0000 ug | ORAL_CAPSULE | Freq: Every day | ORAL | 1 refills | Status: DC

## 2019-08-03 MED ORDER — PREDNISONE 20 MG PO TABS: 40.0000 mg | ORAL_TABLET | Freq: Every day | ORAL | 0 refills | Status: DC

## 2019-09-04 ENCOUNTER — Telehealth: Payer: Self-pay | Admitting: Obstetrics and Gynecology

## 2019-09-04 NOTE — Telephone Encounter (Signed)
Patient is schedule Tuesday, 09/15/19 at 2 pm for nexplanon placement

## 2019-09-14 NOTE — Telephone Encounter (Signed)
Noted. Nexplanon reserved for this patient. 

## 2019-09-15 ENCOUNTER — Other Ambulatory Visit: Payer: Self-pay

## 2019-09-15 ENCOUNTER — Encounter: Payer: Self-pay | Admitting: Obstetrics and Gynecology

## 2019-09-15 ENCOUNTER — Ambulatory Visit (INDEPENDENT_AMBULATORY_CARE_PROVIDER_SITE_OTHER): Payer: Medicaid Other | Admitting: Obstetrics and Gynecology

## 2019-09-15 VITALS — BP 110/80 | Ht 63.0 in | Wt 204.0 lb

## 2019-09-15 DIAGNOSIS — Z30017 Encounter for initial prescription of implantable subdermal contraceptive: Secondary | ICD-10-CM

## 2019-09-15 DIAGNOSIS — Z113 Encounter for screening for infections with a predominantly sexual mode of transmission: Secondary | ICD-10-CM

## 2019-09-15 MED ORDER — ETONOGESTREL 68 MG ~~LOC~~ IMPL: 68.0000 mg | DRUG_IMPLANT | Freq: Once | SUBCUTANEOUS | Status: AC

## 2019-09-15 NOTE — Progress Notes (Signed)
   Chief Complaint  Patient presents with  . Contraception    Nexplanon insertion     HPI:  Victoria Shepard is a 48 y.o. G3P3 here for Nexplanon insertion. No GYN concerns.  Last annual 1/20. Has had 3 nexplanon in past and was amenorrheic. Loves them. Had removed 12/19 in order to conceive but no longer interested in conception. Wants another one. Husband unfaithful, pt wants HIV testing. Will wait to do other STD testing at 1/21 annual. Not currently sex active.  BP 110/80   Ht 5\' 3"  (1.6 m)   Wt 204 lb (92.5 kg)   LMP 09/06/2019 (Exact Date)   BMI 36.14 kg/m    Nexplanon Insertion  Patient given informed consent, signed copy in the chart, time out was performed.  Appropriate time out taken.  Patient's LEFT arm was prepped and draped in the usual sterile fashion. The ruler used to measure and mark insertion area.  Pt was prepped with betadine swab and then injected with 1.0 cc of 2% lidocaine with epinephrine. Nexplanon removed form packaging,  Device confirmed in needle, then inserted full length of needle and withdrawn per handbook instructions.  Pt insertion site covered with steri-strip and a bandage.   Minimal blood loss.  Pt tolerated the procedure welL.  Assessment: Nexplanon insertion - Plan: etonogestrel (NEXPLANON) implant 68 mg  Screening for STD (sexually transmitted disease) - Plan: HIV   Meds ordered this encounter  Medications  . etonogestrel (NEXPLANON) implant 68 mg     Plan:   She was told to remove the dressing in 12-24 hours, to keep the incision area dry for 24 hours and to remove the Steristrip in 2-3  days.  Notify us if any signs of tenderness, redness, pain, or fevers develop.   Alicia B. Copland, PA-C 09/15/2019 2:44 PM

## 2019-09-15 NOTE — Patient Instructions (Signed)
I value your feedback and entrusting [place] with your care. If you get a [facility] patient survey, I would appreciate you taking the time to let us know about your experience today. Thank you!  Remove the dressing in 24 hours,  keep the incision area dry for 24 hours and remove the Steristrip in 2-3  days.  Notify us if any signs of tenderness, redness, pain, or fevers develop.

## 2019-09-16 LAB — HIV ANTIBODY (ROUTINE TESTING W REFLEX): HIV Screen 4th Generation wRfx: NONREACTIVE

## 2019-10-14 ENCOUNTER — Encounter: Payer: Self-pay | Admitting: Family Medicine

## 2019-10-15 ENCOUNTER — Other Ambulatory Visit: Payer: Self-pay | Admitting: Family Medicine

## 2019-10-15 MED ORDER — VALACYCLOVIR HCL 500 MG PO TABS: 500.0000 mg | ORAL_TABLET | Freq: Every day | ORAL | 1 refills | Status: DC

## 2019-10-20 ENCOUNTER — Encounter: Payer: Self-pay | Admitting: Family Medicine

## 2019-10-20 ENCOUNTER — Other Ambulatory Visit: Payer: Self-pay

## 2019-10-20 ENCOUNTER — Telehealth (INDEPENDENT_AMBULATORY_CARE_PROVIDER_SITE_OTHER): Payer: 59 | Admitting: Family Medicine

## 2019-10-20 VITALS — Ht 63.0 in | Wt 202.0 lb

## 2019-10-20 DIAGNOSIS — F5104 Psychophysiologic insomnia: Secondary | ICD-10-CM

## 2019-10-20 DIAGNOSIS — F419 Anxiety disorder, unspecified: Secondary | ICD-10-CM

## 2019-10-20 MED ORDER — CLONAZEPAM 1 MG PO TABS: 1.0000 mg | ORAL_TABLET | Freq: Every day | ORAL | 0 refills | Status: DC | PRN

## 2019-10-20 MED ORDER — ZOLPIDEM TARTRATE 5 MG PO TABS: 5.0000 mg | ORAL_TABLET | Freq: Every evening | ORAL | 0 refills | Status: DC | PRN

## 2019-10-20 MED ORDER — CITALOPRAM HYDROBROMIDE 20 MG PO TABS: 20.0000 mg | ORAL_TABLET | Freq: Every day | ORAL | 0 refills | Status: DC
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
## 2019-10-26 ENCOUNTER — Ambulatory Visit: Payer: Medicaid Other | Admitting: Obstetrics and Gynecology

## 2019-10-26 ENCOUNTER — Encounter: Payer: Self-pay | Admitting: Family Medicine

## 2019-11-02 ENCOUNTER — Encounter: Payer: Self-pay | Admitting: Obstetrics and Gynecology

## 2019-11-05 ENCOUNTER — Encounter: Payer: Self-pay | Admitting: Family Medicine

## 2019-11-06 ENCOUNTER — Other Ambulatory Visit: Payer: Self-pay | Admitting: Family Medicine

## 2019-11-06 ENCOUNTER — Encounter: Payer: Self-pay | Admitting: Family Medicine

## 2019-11-12 ENCOUNTER — Encounter: Payer: Self-pay | Admitting: Nurse Practitioner

## 2019-11-12 ENCOUNTER — Other Ambulatory Visit: Payer: Self-pay

## 2019-11-12 ENCOUNTER — Ambulatory Visit (INDEPENDENT_AMBULATORY_CARE_PROVIDER_SITE_OTHER): Payer: 59 | Admitting: Nurse Practitioner

## 2019-11-12 VITALS — Ht 63.0 in | Wt 189.0 lb

## 2019-11-12 DIAGNOSIS — J0100 Acute maxillary sinusitis, unspecified: Secondary | ICD-10-CM

## 2019-11-12 MED ORDER — DOXYCYCLINE HYCLATE 100 MG PO TABS: 100.0000 mg | ORAL_TABLET | Freq: Two times a day (BID) | ORAL | 0 refills | Status: DC

## 2019-11-12 MED ORDER — FLUCONAZOLE 150 MG PO TABS: 150.0000 mg | ORAL_TABLET | Freq: Once | ORAL | 0 refills | Status: AC

## 2019-11-12 NOTE — Progress Notes (Signed)
Ht 5\' 3"  (1.6 m)   Wt 189 lb (85.7 kg)   BMI 33.48 kg/m    Subjective:    Patient ID: Victoria Shepard, female    DOB: 28-Oct-1970, 49 y.o.   MRN: [id number]  HPI: Victoria Shepard is a 49 y.o. female  Chief Complaint  Patient presents with  . Sinusitis    Ongoing 9 days.  . Nasal Congestion  . Headache   UPPER RESPIRATORY TRACT INFECTION Worst symptom: headache Fever: yes, low grade 99.2 Cough: no Shortness of breath: no Wheezing: no Chest pain: no Chest tightness: no Chest congestion: no Nasal congestion: yes, yellow with blood tinged Runny nose: no Post nasal drip: yes Sneezing: no Sore throat: no Swollen glands: no Sinus pressure: yes Headache: yes Face pain: yes Toothache: no Ear pain: no  Ear pressure: no  Eyes red/itching:no Eye drainage/crusting: no  Vomiting: no  Diarrhea: Rash: no Fatigue: no Sick contacts: no Strep contacts: no  Context: better Recurrent sinusitis: yes Relief with OTC cold/cough medications: yes  Treatments attempted: flonase, allergy meds, Tylenol  Victoria Shepard states that the heat in her home broke for a few weeks and they were using a wood-burning stove for that time.  She thinks that is what triggered the flare-up initially.  Allergies  Allergen Reactions  . Amoxicillin Anaphylaxis  . Penicillins Anaphylaxis  . Oseltamivir Hives  . Bactrim [Sulfamethoxazole-Trimethoprim] Itching and Swelling  . Erythromycin Other (See Comments)    GI Upset  . Keflex [Cephalexin] Other (See Comments)    Yeast infection   Outpatient Encounter Medications as of 11/12/2019  Medication Sig  . citalopram (CELEXA) 20 MG tablet TAKE 1 TABLET BY MOUTH EVERY DAY  . clonazePAM (KLONOPIN) 1 MG tablet Take 1 tablet (1 mg total) by mouth daily as needed for anxiety.  . fluticasone (FLONASE) 50 MCG/ACT nasal spray Place 2 sprays into both nostrils daily.  Marland Kitchen levocetirizine (XYZAL) 5 MG tablet Take 1 tablet (5 mg total) by mouth every evening.  . linaclotide  (LINZESS) 145 MCG CAPS capsule Take 1 capsule (145 mcg total) by mouth daily before breakfast.  . MELATONIN PO Take 1 Dose by mouth daily.  . mometasone (ELOCON) 0.1 % cream APPLY DAILY  . pantoprazole (PROTONIX) 40 MG tablet Take 1 tablet (40 mg total) by mouth daily.  . valACYclovir (VALTREX) 500 MG tablet Take 1 tablet (500 mg total) by mouth daily.  Marland Kitchen doxycycline (VIBRA-TABS) 100 MG tablet Take 1 tablet (100 mg total) by mouth 2 (two) times daily.  . fluconazole (DIFLUCAN) 150 MG tablet Take 1 tablet (150 mg total) by mouth once for 1 dose.  . zolpidem (AMBIEN) 5 MG tablet Take 1 tablet (5 mg total) by mouth at bedtime as needed for sleep. (Patient not taking: Reported on 11/12/2019)  . [DISCONTINUED] doxycycline (VIBRA-TABS) 100 MG tablet Take 1 tablet (100 mg total) by mouth 2 (two) times daily.   Facility-Administered Encounter Medications as of 11/12/2019  Medication  . etonogestrel (NEXPLANON) implant 68 mg   Patient Active Problem List   Diagnosis Date Noted  . Sinusitis 06/04/2019  . CIN I (cervical intraepithelial neoplasia I) 10/24/2018  . Irregular heartbeat 05/09/2017  . Vision changes 05/09/2017  . Morbid obesity ([place]) 03/20/2017  . Pap smear abnormality of cervix with ASCUS favoring dysplasia 03/08/2017  . IBS (irritable bowel syndrome) 10/25/2016  . Herpes simplex 04/29/2015  . High cholesterol 04/29/2015  . Anxiety   . Hypertension   . OCD (obsessive compulsive disorder)  Past Medical History:  Diagnosis Date  . Anxiety   . Depression   . Heart murmur   . Herpes   . History of abnormal mammogram 2010  . History of cellulitis    belly button  . Hypercholesteremia   . Hypertension   . Nexplanon in place    placed 10/11/14, remove 10/11/17  . OCD (obsessive compulsive disorder)     Relevant past medical, surgical, family and social history reviewed and updated as indicated. Interim medical history since our last visit reviewed.  Review of Systems   Constitutional: Positive for fever (low grade). Negative for activity change, appetite change and fatigue.  HENT: Positive for congestion, facial swelling, postnasal drip, sinus pressure and sinus pain. Negative for dental problem, ear discharge, ear pain, nosebleeds, rhinorrhea, sneezing, sore throat and trouble swallowing.   Eyes: Negative.  Negative for pain, discharge, redness and itching.  Respiratory: Negative.  Negative for cough, chest tightness and shortness of breath.   Cardiovascular: Negative.  Negative for chest pain and palpitations.  Gastrointestinal: Negative.  Negative for abdominal pain, blood in stool, diarrhea, nausea and vomiting.  Musculoskeletal: Negative.  Negative for arthralgias and myalgias.  Skin: Negative.  Negative for rash.  Neurological: Positive for headaches. Negative for dizziness and weakness.  Psychiatric/Behavioral: Negative.  Negative for confusion, self-injury and sleep disturbance.    Per HPI unless specifically indicated above     Objective:    Ht 5\' 3"  (1.6 m)   Wt 189 lb (85.7 kg)   BMI 33.48 kg/m   Wt Readings from Last 3 Encounters:  11/12/19 189 lb (85.7 kg)  10/20/19 202 lb (91.6 kg)  09/15/19 204 lb (92.5 kg)    Physical Exam Vitals and nursing note reviewed.  Constitutional:      General: She is not in acute distress.    Appearance: She is well-developed. She is not toxic-appearing.  HENT:     Head: Normocephalic and atraumatic.     Right Ear: External ear normal.     Left Ear: External ear normal.     Nose: Nose normal. No congestion or rhinorrhea.     Mouth/Throat:     Mouth: Mucous membranes are moist.     Pharynx: Oropharynx is clear. No oropharyngeal exudate.  Eyes:     General: No scleral icterus.    Extraocular Movements: Extraocular movements intact.     Pupils: Pupils are equal, round, and reactive to light.  Pulmonary:     Effort: Pulmonary effort is normal. No respiratory distress.  Musculoskeletal:      Cervical back: Normal range of motion.  Skin:    General: Skin is warm and dry.     Coloration: Skin is not jaundiced or pale.  Neurological:     General: No focal deficit present.     Mental Status: [name] and oriented to person, place, and time.  Psychiatric:        Mood and Affect: Mood normal.        Behavior: Behavior normal.        Thought Content: Thought content normal.        Judgment: Judgment normal.     Results for orders placed or performed in visit on 09/15/19  HIV  Result Value Ref Range   HIV Screen 4th Generation wRfx Non Reactive Non Reactive      Assessment & Plan:   Problem List Items Addressed This Visit      Respiratory   Sinusitis - Primary  Acute, ongoing despite taking zyxal and flonase.  Given length of symptoms, and failure of conservative measures, will start on doxycycline.  Diflucan also given for yeast infection, pt reports this occurs after taking doxycycline.  Patient advised to return to clinic if symptoms do not improve or worsen.      Relevant Medications   doxycycline (VIBRA-TABS) 100 MG tablet   fluconazole (DIFLUCAN) 150 MG tablet       Follow up plan: Return if symptoms worsen or fail to improve.  Due to the catastrophic nature of the COVID-19 pandemic, this visit was done through audio contact only.","This visit was completed via Doximity due to the restrictions of the COVID-19 pandemic. All issues as above were discussed and addressed. Physical exam was done as above through visual confirmation on Doximity. If it was felt that the patient should be evaluated in the office, they were directed there. The patient verbally consented to this visit."} . Location of the patient: home . Location of the provider: work . Those involved with this call:  . Provider: Carnella Guadal[place], DNP . CMA: Merilyn Baba, CMA . Front Desk/Registration: Don Perking  . Time spent on call: 21 minutes on the phone discussing health concerns. 15  minutes total spent in review of patient's record and preparation of their chart.

## 2019-11-12 NOTE — Assessment & Plan Note (Addendum)
Acute, ongoing despite taking zyxal and flonase.  Given length of symptoms, and failure of conservative measures, will start on doxycycline.  Diflucan also given for yeast infection, pt reports this occurs after taking doxycycline.  Patient advised to return to clinic if symptoms do not improve or worsen.

## 2019-11-13 NOTE — Assessment & Plan Note (Signed)
Will add rare prn klonopin as she's done well on this in the past while she works with counseling regarding he recent trauma. Continue celexa, monitor closely. Goal is for her klonopin to last 2-3 months.

## 2019-11-20 ENCOUNTER — Telehealth (INDEPENDENT_AMBULATORY_CARE_PROVIDER_SITE_OTHER): Payer: 59 | Admitting: Family Medicine

## 2019-11-20 ENCOUNTER — Ambulatory Visit: Payer: 59 | Admitting: Family Medicine

## 2019-11-20 ENCOUNTER — Encounter: Payer: Self-pay | Admitting: Family Medicine

## 2019-11-20 VITALS — BP 126/80 | HR 75 | Wt 192.0 lb

## 2019-11-20 DIAGNOSIS — F419 Anxiety disorder, unspecified: Secondary | ICD-10-CM | POA: Diagnosis not present

## 2019-11-20 DIAGNOSIS — K582 Mixed irritable bowel syndrome: Secondary | ICD-10-CM | POA: Diagnosis not present

## 2019-11-20 DIAGNOSIS — J0100 Acute maxillary sinusitis, unspecified: Secondary | ICD-10-CM | POA: Diagnosis not present

## 2019-11-20 DIAGNOSIS — F3341 Major depressive disorder, recurrent, in partial remission: Secondary | ICD-10-CM

## 2019-11-20 MED ORDER — LUBIPROSTONE 8 MCG PO CAPS: 8.0000 ug | ORAL_CAPSULE | Freq: Two times a day (BID) | ORAL | 1 refills | Status: DC

## 2019-11-20 MED ORDER — AZITHROMYCIN 250 MG PO TABS: ORAL_TABLET | ORAL | 0 refills | Status: DC

## 2019-11-20 MED ORDER — CITALOPRAM HYDROBROMIDE 40 MG PO TABS: 40.0000 mg | ORAL_TABLET | Freq: Every day | ORAL | 3 refills | Status: DC

## 2019-11-20 NOTE — Assessment & Plan Note (Signed)
Not improved on doxycycline, will change to zpak and continue home allergy regimen and supportive care. F/u if not resolving

## 2019-11-20 NOTE — Assessment & Plan Note (Signed)
Unable to afford linzess now. Try amitiza and monitor for benefit

## 2019-11-20 NOTE — Assessment & Plan Note (Signed)
Improved with celexa re-addition, increase to 40 mg and continue to monitor. Continue prn klonopin with goal of 1 script lasting at least 2 months. Start with new counselor and work on getting in with Psychiatry (she declines referral, states her therapist is connecting her with someone).

## 2019-11-20 NOTE — Progress Notes (Signed)
BP 126/80   Pulse 75   Wt 192 lb (87.1 kg)   BMI 34.01 kg/m    Subjective:    Patient ID: Victoria Shepard, female    DOB: [birth year], 49 y.o.   MRN: [id number]  HPI: Victoria Shepard is a 49 y.o. female  Chief Complaint  Patient presents with  . Depression  . Anxiety  . Irritable Bowel Syndrome    pt wants to discuss about linzess med    . This visit was completed via WebEx due to the restrictions of the COVID-19 pandemic. All issues as above were discussed and addressed. Physical exam was done as above through visual confirmation on WebEx. If it was felt that the patient should be evaluated in the office, they were directed there. The patient verbally consented to this visit. . Location of the patient: home . Location of the provider: work . Those involved with this call:  . Provider: Merrie Roof, PA-C . CMA: Lesle Chris, [place] . Front Desk/Registration: Jill Side  . Time spent on call: 25 minutes with patient face to face via video conference. More than 50% of this time was spent in counseling and coordination of care. 5 minutes total spent in review of patient's record and preparation of their chart. I verified patient identity using two factors (patient name and date of birth). Patient consents verbally to being seen via telemedicine visit today.   Starting with a new therapist soon and that therapist is working toward getting her in with a Psychiatrist. She does not wish to have a referral at this time from [place]. Celexa helping but wanting to increase the dose. Taking the klonopin daily prn, trying to decrease usage now that the celexa is helping so much. Denies SI/HI and states she's exercising again and not over eating or over sleeping.   Linzess is no longer covered by her insurance, wanting to try something similar for her chronic constipation.   Still dealing with sinus infection from several weeks ago, about to finish the doxycycline without relief. Denies fever, chills  but having sinus pain and pressure, congestion, fatigue.   Depression screen [facility] 2/9 11/20/2019 10/20/2019 11/18/2018  Decreased Interest 0 1 0  Down, Depressed, Hopeless 0 3 0  PHQ - 2 Score 0 4 0  Altered sleeping 3 3 0  Tired, decreased energy 1 3 0  Change in appetite 1 3 2   Feeling bad or failure about yourself  1 3 0  Trouble concentrating 0 0 0  Moving slowly or fidgety/restless 0 0 0  Suicidal thoughts 0 0 0  PHQ-9 Score 6 16 2   Difficult doing work/chores Not difficult at all Very difficult -   GAD 7 : Generalized Anxiety Score 11/20/2019 10/20/2019 11/18/2018 05/31/2016  Nervous, Anxious, on Edge 1 3 0 3  Control/stop worrying 1 3 0 2  Worry too much - different things 1 3 0 2  Trouble relaxing 1 3 0 3  Restless 0 0 0 2  Easily annoyed or irritable 0 3 0 2  Afraid - awful might happen 1 3 0 1  Total GAD 7 Score 5 18 0 15  Anxiety Difficulty Not difficult at all Very difficult Not difficult at all Very difficult     Relevant past medical, surgical, family and social history reviewed and updated as indicated. Interim medical history since our last visit reviewed. Allergies and medications reviewed and updated.  Review of Systems  Per HPI unless specifically indicated above  Objective:    BP 126/80   Pulse 75   Wt 192 lb (87.1 kg)   BMI 34.01 kg/m   Wt Readings from Last 3 Encounters:  11/20/19 192 lb (87.1 kg)  11/12/19 189 lb (85.7 kg)  10/20/19 202 lb (91.6 kg)    Physical Exam Vitals and nursing note reviewed.  Constitutional:      General: She is not in acute distress.    Appearance: Normal appearance.  HENT:     Head: Atraumatic.     Right Ear: External ear normal.     Left Ear: External ear normal.     Nose: Nose normal. No congestion.     Mouth/Throat:     Mouth: Mucous membranes are moist.     Pharynx: Oropharynx is clear. No posterior oropharyngeal erythema.  Eyes:     Extraocular Movements: Extraocular movements intact.     Conjunctiva/sclera:  Conjunctivae normal.  Cardiovascular:     Comments: Unable to assess via virtual visit Pulmonary:     Effort: Pulmonary effort is normal. No respiratory distress.  Musculoskeletal:        General: Normal range of motion.     Cervical back: Normal range of motion.  Skin:    General: Skin is dry.     Findings: No erythema.  Neurological:     Mental Status: [name] and oriented to person, place, and time.  Psychiatric:        Mood and Affect: Mood normal.        Thought Content: Thought content normal.        Judgment: Judgment normal.     Results for orders placed or performed in visit on 09/15/19  HIV  Result Value Ref Range   HIV Screen 4th Generation wRfx Non Reactive Non Reactive      Assessment & Plan:   Problem List Items Addressed This Visit      Respiratory   Sinusitis - Primary    Not improved on doxycycline, will change to zpak and continue home allergy regimen and supportive care. F/u if not resolving      Relevant Medications   azithromycin (ZITHROMAX) 250 MG tablet     Digestive   IBS (irritable bowel syndrome)    Unable to afford linzess now. Try amitiza and monitor for benefit      Relevant Medications   lubiprostone (AMITIZA) 8 MCG capsule     Other   Anxiety    Improved with celexa re-addition, increase to 40 mg and continue to monitor. Continue prn klonopin with goal of 1 script lasting at least 2 months. Start with new counselor and work on getting in with Psychiatry (she declines referral, states her therapist is connecting her with someone).       Relevant Medications   citalopram (CELEXA) 40 MG tablet   Depression    Significantly improved back on celexa, increase dose and continue working with counselor and will get in with Psychiatry soon      Relevant Medications   citalopram (CELEXA) 40 MG tablet       Follow up plan: Return in about 4 weeks (around 12/18/2019) for Mood, anxiety f/u.

## 2019-11-20 NOTE — Assessment & Plan Note (Signed)
Significantly improved back on celexa, increase dose and continue working with counselor and will get in with Psychiatry soon

## 2019-11-26 NOTE — Progress Notes (Signed)
PCP:  Volney American, PA-C   Chief Complaint  Patient presents with  . Gynecologic Exam     HPI:      [name] is a 49 y.o. G3P3 who LMP was Patient's last menstrual period was 10/24/2019., presents today for her annual examination.  Her menses are irregular since nexplanon placement 12/20. Was having heavy bleeding for a few wks, now daily spotting/brown d/c.  Was amenorrheic with 3 nexplanons in past. Dysmenorrhea none.   Sex activity: single partner, contraception -  Nexplanon placed 09/15/19 Last Pap: October 24, 2018  Results were: no abnormalities /neg HPV DNA  Hx of STDs: HSV, takes valtrex prn. Had neg HIV testing 12/20. Wants again 6 months after potential exposure (10/20), although partner was HIV neg at that time, but has pt worried.   Last mammogram: December 18, 2018  Results were: normal--routine follow-up in 12 months There is no FH of breast cancer. There is no FH of ovarian cancer. The patient does do self-breast exams.  Tobacco use: The patient denies current or previous tobacco use. Alcohol use: none No drug use.  Exercise: very active Is losing wt with wt watchers and exercise. Would like to try a couple months phentermine. Discussed with Dr. Kenton Kingfisher in the past. Not on HTN meds   She does get adequate calcium but not Vitamin D in her diet. Labs with PCP  Past Medical History:  Diagnosis Date  . Anxiety   . Depression   . Heart murmur   . Herpes   . History of abnormal mammogram 2010  . History of cellulitis    belly button  . Hypercholesteremia   . Hypertension   . Nexplanon in place    placed 10/11/14, remove 10/11/17  . OCD (obsessive compulsive disorder)     Past Surgical History:  Procedure Laterality Date  . BIOPSY BREAST  04/2010   was normal  . BREAST EXCISIONAL BIOPSY Left 2011   benign  . BREAST SURGERY Left 2011  . COLONOSCOPY  2010  . COSMETIC SURGERY  2012  . HERNIA REPAIR  2012  . LIPOSUCTION  2012  . MM  DUCTOGRAM BILAT R/S  2010   due to bloody breast discharge    Family History  Problem Relation Age of Onset  . Hypertension Mother   . Hyperlipidemia Mother   . Diabetes Mother   . Heart disease Father        CABG  . Hypertension Father   . Mental illness Brother        anxiety  . Cancer Maternal Grandmother        lung  . Stroke Paternal Grandmother   . Stroke Paternal Grandfather     Social History   Socioeconomic History  . Marital status: Divorced    Spouse name: Not on file  . Number of children: Not on file  . Years of education: Not on file  . Highest education level: Not on file  Occupational History  . Not on file  Tobacco Use  . Smoking status: Never Smoker  . Smokeless tobacco: Never Used  Substance and Sexual Activity  . Alcohol use: No  . Drug use: No  . Sexual activity: Yes    Birth control/protection: Implant  Other Topics Concern  . Not on file  Social History Narrative  . Not on file   Social Determinants of Health   Financial Resource Strain:   . Difficulty of Paying Living Expenses: Not on  file  Food Insecurity:   . Worried About Charity fundraiser in the Last Year: Not on file  . Ran Out of Food in the Last Year: Not on file  Transportation Needs:   . Lack of Transportation (Medical): Not on file  . Lack of Transportation (Non-Medical): Not on file  Physical Activity:   . Days of Exercise per Week: Not on file  . Minutes of Exercise per Session: Not on file  Stress:   . Feeling of Stress : Not on file  Social Connections:   . Frequency of Communication with Friends and Family: Not on file  . Frequency of Social Gatherings with Friends and Family: Not on file  . Attends Religious Services: Not on file  . Active Member of Clubs or Organizations: Not on file  . Attends Archivist Meetings: Not on file  . Marital Status: Not on file  Intimate Partner Violence:   . Fear of Current or Ex-Partner: Not on file  . Emotionally  Abused: Not on file  . Physically Abused: Not on file  . Sexually Abused: Not on file     Current Outpatient Medications:  .  citalopram (CELEXA) 40 MG tablet, Take 1 tablet (40 mg total) by mouth daily., Disp: 30 tablet, Rfl: 3 .  clonazePAM (KLONOPIN) 1 MG tablet, Take 1 tablet (1 mg total) by mouth daily as needed for anxiety., Disp: 30 tablet, Rfl: 0 .  fluticasone (FLONASE) 50 MCG/ACT nasal spray, Place 2 sprays into both nostrils daily., Disp: 16 g, Rfl: 6 .  levocetirizine (XYZAL) 5 MG tablet, Take 1 tablet (5 mg total) by mouth every evening., Disp: 90 tablet, Rfl: 1 .  MELATONIN PO, Take 1 Dose by mouth daily., Disp: , Rfl:  .  mometasone (ELOCON) 0.1 % cream, APPLY DAILY, Disp: 45 g, Rfl: 1 .  pantoprazole (PROTONIX) 40 MG tablet, Take 1 tablet (40 mg total) by mouth daily., Disp: 90 tablet, Rfl: 4 .  valACYclovir (VALTREX) 500 MG tablet, Take 1 tablet (500 mg total) by mouth daily., Disp: 90 tablet, Rfl: 1 .  phentermine (ADIPEX-P) 37.5 MG tablet, Take 1 tablet (37.5 mg total) by mouth daily before breakfast., Disp: 30 tablet, Rfl: 1  Current Facility-Administered Medications:  .  etonogestrel (NEXPLANON) implant 68 mg, 68 mg, Subdermal, Once, Zani Kyllonen B, PA-C     ROS:  Review of Systems  Constitutional: Negative for fatigue, fever and unexpected weight change.  Respiratory: Negative for cough, shortness of breath and wheezing.   Cardiovascular: Negative for chest pain, palpitations and leg swelling.  Gastrointestinal: Negative for blood in stool, constipation, diarrhea, nausea and vomiting.  Endocrine: Negative for cold intolerance, heat intolerance and polyuria.  Genitourinary: Negative for dyspareunia, dysuria, flank pain, frequency, genital sores, hematuria, menstrual problem, pelvic pain, urgency, vaginal bleeding, vaginal discharge and vaginal pain.  Musculoskeletal: Negative for back pain, joint swelling and myalgias.  Skin: Negative for rash.  Neurological:  Negative for dizziness, syncope, light-headedness, numbness and headaches.  Hematological: Negative for adenopathy.  Psychiatric/Behavioral: Negative for agitation, confusion, sleep disturbance and suicidal ideas. The patient is not nervous/anxious.    BREAST: No symptoms   Objective: BP 122/84 (BP Location: Left Arm, Patient Position: Sitting, Cuff Size: Normal)   Pulse 96   Ht 5\' 3"  (1.6 m)   Wt 198 lb (89.8 kg)   LMP 10/24/2019   BMI 35.07 kg/m    Physical Exam Constitutional:      Appearance: She is well-developed.  Genitourinary:  Vulva, vagina, cervix, uterus, right adnexa and left adnexa normal.     No vulval lesion or tenderness noted.     No vaginal discharge, erythema or tenderness.     No cervical polyp.     Uterus is not enlarged or tender.     No right or left adnexal mass present.     Right adnexa not tender.     Left adnexa not tender.  Neck:     Thyroid: No thyromegaly.  Cardiovascular:     Rate and Rhythm: Normal rate and regular rhythm.     Heart sounds: Normal heart sounds. No murmur.  Pulmonary:     Effort: Pulmonary effort is normal.     Breath sounds: Normal breath sounds.  Chest:     Breasts:        Right: No mass, nipple discharge, skin change or tenderness.        Left: No mass, nipple discharge, skin change or tenderness.  Abdominal:     Palpations: Abdomen is soft.     Tenderness: There is no abdominal tenderness. There is no guarding.  Musculoskeletal:        General: Normal range of motion.     Cervical back: Normal range of motion.  Neurological:     General: No focal deficit present.     Mental Status: [name] and oriented to person, place, and time.     Cranial Nerves: No cranial nerve deficit.  Skin:    General: Skin is warm and dry.  Psychiatric:        Mood and Affect: Mood normal.        Behavior: Behavior normal.        Thought Content: Thought content normal.        Judgment: Judgment normal.  Vitals reviewed.      Assessment/Plan: Encounter for annual routine gynecological examination  Encounter for surveillance of implantable subdermal contraceptive--BTB with nexplanon. F/u in a month or so if daily bleeding continues. Will add POP. Lighter flow now  Screening for STD (sexually transmitted disease) - Plan: HIV Antibody (routine testing w rflx); Check HIV in 4/21.  Encounter for screening mammogram for malignant neoplasm of breast - Plan: MM 3D SCREEN BREAST BILATERAL; pt to sched mammo  Weight loss counseling, encounter for - Plan: phentermine (ADIPEX-P) 37.5 MG tablet; cont wt watchers and exercise. Phentermine for 2 months. F/u prn.   Meds ordered this encounter  Medications  . phentermine (ADIPEX-P) 37.5 MG tablet    Sig: Take 1 tablet (37.5 mg total) by mouth daily before breakfast.    Dispense:  30 tablet    Refill:  1    Order Specific Question:   Supervising Provider    Answer:   Gae Dry [id number]             GYN counsel breast self exam, mammography screening, adequate intake of calcium and vitamin D, diet and exercise     F/U  Return in about 1 year (around 11/26/2020).  Shaiann Mcmanamon B. Ivoree Felmlee, PA-C 11/27/2019 9:39 AM

## 2019-11-27 ENCOUNTER — Encounter: Payer: Self-pay | Admitting: Obstetrics and Gynecology

## 2019-11-27 ENCOUNTER — Ambulatory Visit (INDEPENDENT_AMBULATORY_CARE_PROVIDER_SITE_OTHER): Payer: 59 | Admitting: Obstetrics and Gynecology

## 2019-11-27 ENCOUNTER — Other Ambulatory Visit: Payer: Self-pay

## 2019-11-27 VITALS — BP 122/84 | HR 96 | Ht 63.0 in | Wt 198.0 lb

## 2019-11-27 DIAGNOSIS — Z6835 Body mass index (BMI) 35.0-35.9, adult: Secondary | ICD-10-CM

## 2019-11-27 DIAGNOSIS — Z3046 Encounter for surveillance of implantable subdermal contraceptive: Secondary | ICD-10-CM

## 2019-11-27 DIAGNOSIS — E669 Obesity, unspecified: Secondary | ICD-10-CM

## 2019-11-27 DIAGNOSIS — Z01419 Encounter for gynecological examination (general) (routine) without abnormal findings: Secondary | ICD-10-CM

## 2019-11-27 DIAGNOSIS — Z713 Dietary counseling and surveillance: Secondary | ICD-10-CM

## 2019-11-27 DIAGNOSIS — Z113 Encounter for screening for infections with a predominantly sexual mode of transmission: Secondary | ICD-10-CM

## 2019-11-27 DIAGNOSIS — Z1231 Encounter for screening mammogram for malignant neoplasm of breast: Secondary | ICD-10-CM

## 2019-11-27 MED ORDER — PHENTERMINE HCL 37.5 MG PO TABS: 37.5000 mg | ORAL_TABLET | Freq: Every day | ORAL | 1 refills | Status: DC

## 2019-11-27 NOTE — Patient Instructions (Addendum)
I value your feedback and entrusting us with your care. If you get a [facility] patient survey, I would appreciate you taking the time to let us know about your experience today. Thank you! ° °As of September 24, 2019, your lab results will be released to your MyChart immediately, before I even have a chance to see them. Please give me time to review them and contact you if there are any abnormalities. Thank you for your patience.  ° °[facility]: [phone] ° ° ° °

## 2019-11-28 ENCOUNTER — Other Ambulatory Visit: Payer: Self-pay | Admitting: Obstetrics and Gynecology

## 2019-11-28 MED ORDER — NORETHINDRONE 0.35 MG PO TABS: 1.0000 | ORAL_TABLET | Freq: Every day | ORAL | 0 refills | Status: DC

## 2019-11-28 NOTE — Progress Notes (Signed)
Rx camila for BTB on nexplanon

## 2019-12-06 ENCOUNTER — Encounter: Payer: Self-pay | Admitting: Family Medicine

## 2019-12-08 ENCOUNTER — Other Ambulatory Visit: Payer: Self-pay | Admitting: Family Medicine

## 2019-12-08 DIAGNOSIS — F419 Anxiety disorder, unspecified: Secondary | ICD-10-CM

## 2019-12-08 MED ORDER — CLONAZEPAM 1 MG PO TABS: 1.0000 mg | ORAL_TABLET | Freq: Every day | ORAL | 0 refills | Status: DC | PRN

## 2019-12-18 ENCOUNTER — Telehealth (INDEPENDENT_AMBULATORY_CARE_PROVIDER_SITE_OTHER): Payer: 59 | Admitting: Family Medicine

## 2019-12-18 ENCOUNTER — Encounter: Payer: Self-pay | Admitting: Family Medicine

## 2019-12-18 VITALS — BP 123/71 | HR 83 | Wt 194.0 lb

## 2019-12-18 DIAGNOSIS — F3341 Major depressive disorder, recurrent, in partial remission: Secondary | ICD-10-CM

## 2019-12-18 DIAGNOSIS — R7309 Other abnormal glucose: Secondary | ICD-10-CM

## 2019-12-18 DIAGNOSIS — T148XXA Other injury of unspecified body region, initial encounter: Secondary | ICD-10-CM

## 2019-12-18 DIAGNOSIS — R5383 Other fatigue: Secondary | ICD-10-CM

## 2019-12-18 DIAGNOSIS — F419 Anxiety disorder, unspecified: Secondary | ICD-10-CM

## 2019-12-18 LAB — COAGUCHEK XS/INR WAIVED
INR: 1 (ref 0.9–1.1)
Prothrombin Time: 11.9 s
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
## 2019-12-19 LAB — COMPREHENSIVE METABOLIC PANEL
ALT: 19 IU/L (ref 0–32)
AST: 17 IU/L (ref 0–40)
Albumin/Globulin Ratio: 1.8 (ref 1.2–2.2)
Albumin: 4.4 g/dL (ref 3.8–4.8)
Alkaline Phosphatase: 62 IU/L (ref 39–117)
BUN/Creatinine Ratio: 7 — ABNORMAL LOW (ref 9–23)
BUN: 6 mg/dL (ref 6–24)
Bilirubin Total: 1.3 mg/dL — ABNORMAL HIGH (ref 0.0–1.2)
CO2: 22 mmol/L (ref 20–29)
Calcium: 8.8 mg/dL (ref 8.7–10.2)
Chloride: 103 mmol/L (ref 96–106)
Creatinine, Ser: 0.81 mg/dL (ref 0.57–1.00)
GFR calc Af Amer: 100 mL/min/{1.73_m2} (ref >59)
GFR calc non Af Amer: 87 mL/min/{1.73_m2} (ref >59)
Globulin, Total: 2.5 g/dL (ref 1.5–4.5)
Glucose: 72 mg/dL (ref 65–99)
Potassium: 4.2 mmol/L (ref 3.5–5.2)
Sodium: 137 mmol/L (ref 134–144)
Total Protein: 6.9 g/dL (ref 6.0–8.5)

## 2019-12-19 LAB — CBC WITH DIFFERENTIAL/PLATELET
Basophils Absolute: 0.1 10*3/uL (ref 0.0–0.2)
Basos: 1 %
EOS (ABSOLUTE): 0.1 10*3/uL (ref 0.0–0.4)
Eos: 1 %
Hematocrit: 43.6 % (ref 34.0–46.6)
Hemoglobin: 14.4 g/dL (ref 11.1–15.9)
Immature Grans (Abs): 0 10*3/uL (ref 0.0–0.1)
Immature Granulocytes: 0 %
Lymphocytes Absolute: 2.7 10*3/uL (ref 0.7–3.1)
Lymphs: 36 %
MCH: 29.4 pg (ref 26.6–33.0)
MCHC: 33 g/dL (ref 31.5–35.7)
MCV: 89 fL (ref 79–97)
Monocytes Absolute: 0.6 10*3/uL (ref 0.1–0.9)
Monocytes: 8 %
Neutrophils Absolute: 4 10*3/uL (ref 1.4–7.0)
Neutrophils: 54 %
Platelets: 332 10*3/uL (ref 150–450)
RBC: 4.89 x10E6/uL (ref 3.77–5.28)
RDW: 13.2 % (ref 11.7–15.4)
WBC: 7.6 10*3/uL (ref 3.4–10.8)

## 2019-12-19 LAB — VITAMIN B12: Vitamin B-12: 263 pg/mL (ref 232–1245)

## 2019-12-19 LAB — VITAMIN D 25 HYDROXY (VIT D DEFICIENCY, FRACTURES): Vit D, 25-Hydroxy: 23.2 ng/mL — ABNORMAL LOW (ref 30.0–100.0)

## 2019-12-19 LAB — HEMOGLOBIN A1C
Est. average glucose Bld gHb Est-mCnc: 111 mg/dL
Hgb A1c MFr Bld: 5.5 % (ref 4.8–5.6)

## 2019-12-19 LAB — TSH: TSH: 2.61 u[IU]/mL (ref 0.450–4.500)

## 2019-12-21 ENCOUNTER — Encounter: Payer: Self-pay | Admitting: Family Medicine

## 2019-12-22 ENCOUNTER — Other Ambulatory Visit: Payer: Self-pay | Admitting: Family Medicine

## 2019-12-22 MED ORDER — VITAMIN D (ERGOCALCIFEROL) 1.25 MG (50000 UNIT) PO CAPS: 50000.0000 [IU] | ORAL_CAPSULE | ORAL | 1 refills | Status: DC
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
## 2019-12-28 NOTE — Telephone Encounter (Signed)
Nexplanon rcvd/charged 09/15/2019

## 2020-01-05 ENCOUNTER — Other Ambulatory Visit: Payer: Self-pay | Admitting: Family Medicine

## 2020-01-05 ENCOUNTER — Encounter: Payer: Self-pay | Admitting: Family Medicine

## 2020-01-05 DIAGNOSIS — F419 Anxiety disorder, unspecified: Secondary | ICD-10-CM

## 2020-01-05 MED ORDER — CLONAZEPAM 1 MG PO TABS: 1.0000 mg | ORAL_TABLET | Freq: Every day | ORAL | 0 refills | Status: DC | PRN

## 2020-01-18 ENCOUNTER — Ambulatory Visit: Payer: Medicaid Other | Attending: Internal Medicine

## 2020-01-18 DIAGNOSIS — Z23 Encounter for immunization: Secondary | ICD-10-CM

## 2020-01-18 NOTE — Progress Notes (Signed)
   Covid-19 Vaccination Clinic  Name:  ARIENE KLESS    MRN: [id number] DOB: 09-09-1971  01/18/2020  Ms. Dorothyann Peng was observed post Covid-19 immunization for 15 minutes without incident. She was provided with Vaccine Information Sheet and instruction to access the V-Safe system.   Ms. Dorothyann Peng was instructed to call 911 with any severe reactions post vaccine: Marland Kitchen Difficulty breathing  . Swelling of face and throat  . A fast heartbeat  . A bad rash all over body  . Dizziness and weakness   Immunizations Administered    Name Date Dose VIS Date Route   Pfizer COVID-19 Vaccine 01/18/2020  2:11 PM 0.3 mL 09/25/2019 Intramuscular   Manufacturer: [place]   Lot: [id number]   [place]: [id number]

## 2020-02-02 ENCOUNTER — Encounter: Payer: Self-pay | Admitting: Family Medicine

## 2020-02-03 ENCOUNTER — Other Ambulatory Visit: Payer: Self-pay | Admitting: Family Medicine

## 2020-02-03 ENCOUNTER — Other Ambulatory Visit: Payer: 59

## 2020-02-03 ENCOUNTER — Other Ambulatory Visit: Payer: Self-pay

## 2020-02-03 DIAGNOSIS — F419 Anxiety disorder, unspecified: Secondary | ICD-10-CM

## 2020-02-03 DIAGNOSIS — Z113 Encounter for screening for infections with a predominantly sexual mode of transmission: Secondary | ICD-10-CM

## 2020-02-03 MED ORDER — CLONAZEPAM 1 MG PO TABS: 1.0000 mg | ORAL_TABLET | Freq: Every day | ORAL | 0 refills | Status: DC | PRN

## 2020-02-04 ENCOUNTER — Telehealth: Payer: Self-pay | Admitting: Family Medicine

## 2020-02-04 LAB — HIV ANTIBODY (ROUTINE TESTING W REFLEX): HIV Screen 4th Generation wRfx: NONREACTIVE

## 2020-02-04 NOTE — Telephone Encounter (Signed)
Copied from [place] [phone]. Topic: General - Inquiry >> Feb 04, 2020  4:27 PM Greggory Keen D wrote: Reason for CRM: pt called asking if the office has a copy of her vaccines.  She is starting a new job and needs a copy.  She will come by and pick the copy up .  Her # is [phone]   Called pt she wants our summary and also wants to know if we can find out when she had her chicken pox, measles, and other child hood vaccinations.

## 2020-02-05 NOTE — Telephone Encounter (Signed)
Called and spoke to patient. Printed immunizations that we have on file. Advised patient that we do not have childhood immunizations on file. She states that she was advised yesterday to contact [facility] for immunization records as well. She states that she is waiting on those records as well.

## 2020-02-10 ENCOUNTER — Ambulatory Visit: Payer: Medicaid Other | Attending: Internal Medicine

## 2020-02-10 DIAGNOSIS — Z23 Encounter for immunization: Secondary | ICD-10-CM

## 2020-02-10 NOTE — Progress Notes (Signed)
   Covid-19 Vaccination Clinic  Name:  Victoria Shepard    MRN: [id number] DOB: 1971/01/31  02/10/2020  [name] was observed post Covid-19 immunization for 30 minutes based on pre-vaccination screening .  During the observation period, she experienced an adverse reaction with the following symptoms:  rapid heart rate.  Assessment : Time of assessment 1336. Alert and oriented, and anxious.  Actions taken:  Vitals sign taken  VA[employer] form obtained and completed by RN.  Water given, VS at 1336- BP- 148/92. P-136, O2- 100%, recheck at 1346- BP- 128/90, P- 90, O2- 100%, patient reports anxiety    Medications administered: No medication administered.  Disposition: Reports no further symptoms of adverse reaction after observation for 30 minutes. Discharged home. Instructed to call 911 for trouble breathing, rapid heart rate, dizziness, swelling of tongue or throat. The Patient was provided with Vaccine Information Sheet and instruction to access the V-Safe system.    Immunizations Administered    Name Date Dose VIS Date Route   Pfizer COVID-19 Vaccine 02/10/2020  1:12 PM 0.3 mL 12/09/2018 Intramuscular   Manufacturer: [place]   Lot: [id number]   [place]: [id number]

## 2020-02-10 NOTE — Progress Notes (Signed)
   Covid-19 Vaccination Clinic  Name:  Victoria Shepard    MRN: [id number] DOB: May 10, 1971  02/10/2020  [name] was observed post Covid-19 immunization for 30 minutes based on pre-vaccination screening without incident. She was provided with Vaccine Information Sheet and instruction to access the V-Safe system.   [name] was instructed to call 911 with any severe reactions post vaccine: Marland Kitchen Difficulty breathing  . Swelling of face and throat  . A fast heartbeat  . A bad rash all over body  . Dizziness and weakness   Immunizations Administered    Name Date Dose VIS Date Route   Pfizer COVID-19 Vaccine 02/10/2020  1:12 PM 0.3 mL 12/09/2018 Intramuscular   Manufacturer: [place]   Lot: [id number]   [place]: [id number]

## 2020-02-11 ENCOUNTER — Telehealth: Payer: Self-pay | Admitting: *Deleted

## 2020-02-11 ENCOUNTER — Encounter: Payer: Self-pay | Admitting: *Deleted

## 2020-02-11 NOTE — Telephone Encounter (Signed)
Patient called back and stated that she had a little issues with breathing through out the day and did resolve, she is not sure if it may be attributed to anxiety. She stated that the only issues she is having now is nausea and a bad headache. Advised patient that the nausea and headache are normal and to try and rest and drink plenty of water. Advised that for future COVID boosters to please call in advance for future advice prior. Patient verbalized understanding.

## 2020-02-11 NOTE — Telephone Encounter (Signed)
Called and left a voicemail for the patient asking for her to return call to discuss the second COVID vaccine that she received and to call back with an update. MyChart message sent. Will follow up.

## 2020-02-14 ENCOUNTER — Other Ambulatory Visit: Payer: Self-pay | Admitting: Obstetrics and Gynecology

## 2020-03-02 ENCOUNTER — Encounter: Payer: Self-pay | Admitting: Family Medicine

## 2020-03-02 ENCOUNTER — Other Ambulatory Visit: Payer: Self-pay | Admitting: Family Medicine

## 2020-03-02 DIAGNOSIS — F419 Anxiety disorder, unspecified: Secondary | ICD-10-CM

## 2020-03-02 MED ORDER — CLONAZEPAM 1 MG PO TABS: 1.0000 mg | ORAL_TABLET | Freq: Every day | ORAL | 0 refills | Status: DC | PRN

## 2020-03-21 ENCOUNTER — Ambulatory Visit: Payer: 59 | Admitting: Family Medicine

## 2020-04-02 ENCOUNTER — Other Ambulatory Visit: Payer: Self-pay | Admitting: Family Medicine

## 2020-04-04 ENCOUNTER — Encounter: Payer: Self-pay | Admitting: Family Medicine

## 2020-04-04 ENCOUNTER — Other Ambulatory Visit: Payer: Self-pay | Admitting: Family Medicine

## 2020-04-04 DIAGNOSIS — F419 Anxiety disorder, unspecified: Secondary | ICD-10-CM

## 2020-04-04 MED ORDER — CLONAZEPAM 1 MG PO TABS: 1.0000 mg | ORAL_TABLET | Freq: Every day | ORAL | 0 refills | Status: DC | PRN

## 2020-04-29 ENCOUNTER — Other Ambulatory Visit: Payer: Self-pay | Admitting: Family Medicine

## 2020-05-03 ENCOUNTER — Encounter: Payer: Self-pay | Admitting: Family Medicine

## 2020-05-03 ENCOUNTER — Other Ambulatory Visit: Payer: Self-pay | Admitting: Family Medicine

## 2020-05-03 DIAGNOSIS — F419 Anxiety disorder, unspecified: Secondary | ICD-10-CM

## 2020-05-03 MED ORDER — CLONAZEPAM 1 MG PO TABS: 1.0000 mg | ORAL_TABLET | Freq: Every day | ORAL | 0 refills | Status: DC | PRN

## 2020-05-18 ENCOUNTER — Ambulatory Visit: Payer: 59 | Admitting: Family Medicine

## 2020-05-24 ENCOUNTER — Encounter: Payer: Self-pay | Admitting: Family Medicine

## 2020-05-24 ENCOUNTER — Ambulatory Visit (INDEPENDENT_AMBULATORY_CARE_PROVIDER_SITE_OTHER): Payer: BC Managed Care – PPO | Admitting: Family Medicine

## 2020-05-24 ENCOUNTER — Other Ambulatory Visit: Payer: Self-pay

## 2020-05-24 VITALS — BP 130/89 | HR 88 | Temp 98.9°F | Wt 212.0 lb

## 2020-05-24 DIAGNOSIS — F3341 Major depressive disorder, recurrent, in partial remission: Secondary | ICD-10-CM | POA: Diagnosis not present

## 2020-05-24 DIAGNOSIS — F419 Anxiety disorder, unspecified: Secondary | ICD-10-CM | POA: Diagnosis not present

## 2020-05-24 DIAGNOSIS — K582 Mixed irritable bowel syndrome: Secondary | ICD-10-CM | POA: Diagnosis not present

## 2020-05-24 MED ORDER — CLONAZEPAM 1 MG PO TABS: 1.0000 mg | ORAL_TABLET | Freq: Every day | ORAL | 0 refills | Status: DC | PRN

## 2020-05-24 MED ORDER — PANTOPRAZOLE SODIUM 40 MG PO TBEC: 40.0000 mg | DELAYED_RELEASE_TABLET | Freq: Every day | ORAL | 3 refills | Status: DC

## 2020-05-24 MED ORDER — LINACLOTIDE 145 MCG PO CAPS: 145.0000 ug | ORAL_CAPSULE | Freq: Every day | ORAL | 1 refills | Status: DC

## 2020-05-24 MED ORDER — CITALOPRAM HYDROBROMIDE 40 MG PO TABS: 40.0000 mg | ORAL_TABLET | Freq: Every day | ORAL | 1 refills | Status: DC

## 2020-05-24 NOTE — Progress Notes (Signed)
BP 130/89   Pulse 88   Temp 98.9 F (37.2 C) (Oral)   Wt 212 lb (96.2 kg)   SpO2 97%   BMI 37.55 kg/m    Subjective:    Patient ID: Victoria Shepard, female    DOB: 10/12/1971, 49 y.o.   MRN: [id number]  HPI: Victoria Shepard is a 49 y.o. female  Chief Complaint  Patient presents with  . Anxiety  . Depression  . Sinusitis    headaches, pressure   Started macrobid over weekend from [facility] for a UTI which has been helping but now having sinus pain and pressure, headaches. Dryness and swelling b/l sinuses. This has been going on for a few days now. Using flonase and xyzal which haven't been helping much. Denies fever, chills, CP, SOB, N/V.    Chronic constipation - back on linzess now due to insurance changes and affordability which is helping a lot.   Anxiety and depression - celexa still going well, working on reducing klonopin use from daily to now several times per week. Now that insurance is active, agreeable to going to Psychiatry. Denies SI/HI.   Depression screen [facility] 2/9 05/24/2020 12/18/2019 11/20/2019  Decreased Interest 0 0 0  Down, Depressed, Hopeless 0 0 0  PHQ - 2 Score 0 0 0  Altered sleeping 0 1 3  Tired, decreased energy 1 1 1   Change in appetite 2 0 1  Feeling bad or failure about yourself  0 0 1  Trouble concentrating 0 0 0  Moving slowly or fidgety/restless 0 0 0  Suicidal thoughts 0 0 0  PHQ-9 Score 3 2 6   Difficult doing work/chores - Not difficult at all Not difficult at all   GAD 7 : Generalized Anxiety Score 05/24/2020 12/18/2019 11/20/2019 10/20/2019  Nervous, Anxious, on Edge 0 0 1 3  Control/stop worrying 0 0 1 3  Worry too much - different things 0 0 1 3  Trouble relaxing 0 0 1 3  Restless 0 0 0 0  Easily annoyed or irritable 0 1 0 3  Afraid - awful might happen 0 0 1 3  Total GAD 7 Score 0 1 5 18   Anxiety Difficulty - Not difficult at all Not difficult at all Very difficult   Relevant past medical, surgical, family and social history reviewed and  updated as indicated. Interim medical history since our last visit reviewed. Allergies and medications reviewed and updated.  Review of Systems  Per HPI unless specifically indicated above     Objective:    BP 130/89   Pulse 88   Temp 98.9 F (37.2 C) (Oral)   Wt 212 lb (96.2 kg)   SpO2 97%   BMI 37.55 kg/m   Wt Readings from Last 3 Encounters:  05/24/20 212 lb (96.2 kg)  12/18/19 194 lb (88 kg)  11/27/19 198 lb (89.8 kg)    Physical Exam Vitals and nursing note reviewed.  Constitutional:      Appearance: Normal appearance. She is not ill-appearing.  HENT:     Head: Atraumatic.  Eyes:     Extraocular Movements: Extraocular movements intact.     Conjunctiva/sclera: Conjunctivae normal.  Cardiovascular:     Rate and Rhythm: Normal rate and regular rhythm.     Heart sounds: Normal heart sounds.  Pulmonary:     Effort: Pulmonary effort is normal.     Breath sounds: Normal breath sounds.  Abdominal:     General: Bowel sounds are normal. There is  no distension.     Palpations: Abdomen is soft.     Tenderness: There is no abdominal tenderness. There is no right CVA tenderness, left CVA tenderness or guarding.  Musculoskeletal:        General: Normal range of motion.     Cervical back: Normal range of motion and neck supple.  Skin:    General: Skin is warm and dry.  Neurological:     Mental Status: [name] and oriented to person, place, and time.  Psychiatric:        Mood and Affect: Mood normal.        Thought Content: Thought content normal.        Judgment: Judgment normal.     Results for orders placed or performed in visit on 02/03/20  HIV Antibody (routine testing w rflx)  Result Value Ref Range   HIV Screen 4th Generation wRfx Non Reactive Non Reactive      Assessment & Plan:   Problem List Items Addressed This Visit      Digestive   IBS (irritable bowel syndrome)    Restart linzess given recent insurance change and affordability, continue to  monitor. High fiber diet and fluids recommended      Relevant Medications   pantoprazole (PROTONIX) 40 MG tablet   linaclotide (LINZESS) 145 MCG CAPS capsule     Other   Anxiety - Primary    Continue celexa and prn klonopin with goal of continued reduction in use of klonopin now that stressors are under better control. Will refer to Psychiatry for ongoing counseling and med mgmt. Goal of making this klonopin script last until established with Psychiatry      Relevant Medications   citalopram (CELEXA) 40 MG tablet   clonazePAM (KLONOPIN) 1 MG tablet   Other Relevant Orders   Ambulatory referral to Psychiatry   Depression    Stable, improved. Continue celexa regimen and refer to Psychiatry for ongoing mgmt and counseling      Relevant Medications   citalopram (CELEXA) 40 MG tablet   Other Relevant Orders   Ambulatory referral to Psychiatry       Follow up plan: Return in about 6 months (around 11/24/2020) for 6 month f/u.

## 2020-05-29 NOTE — Assessment & Plan Note (Signed)
Continue celexa and prn klonopin with goal of continued reduction in use of klonopin now that stressors are under better control. Will refer to Psychiatry for ongoing counseling and med mgmt. Goal of making this klonopin script last until established with Psychiatry

## 2020-05-29 NOTE — Assessment & Plan Note (Signed)
Restart linzess given recent insurance change and affordability, continue to monitor. High fiber diet and fluids recommended

## 2020-05-29 NOTE — Assessment & Plan Note (Signed)
Stable, improved. Continue celexa regimen and refer to Psychiatry for ongoing mgmt and counseling

## 2020-06-07 DIAGNOSIS — F4312 Post-traumatic stress disorder, chronic: Secondary | ICD-10-CM | POA: Diagnosis not present

## 2020-06-22 ENCOUNTER — Other Ambulatory Visit: Payer: Self-pay | Admitting: Family Medicine

## 2020-06-22 NOTE — Telephone Encounter (Signed)
Requested medications are due for refill today?  Yes - This strength cannot be delegated.    Requested medications are on active medication list?  Yes  Last Refill:  12/22/2019  # 12 with one refill.    Future visit scheduled?  No  Notes to Clinic:   Strengths of 50,000 IUs cannot be delegated.

## 2020-06-22 NOTE — Telephone Encounter (Signed)
Last seen 06/01/20 by RL. Last lab for Vitamin D was in March.

## 2020-07-04 ENCOUNTER — Ambulatory Visit: Payer: BC Managed Care – PPO | Admitting: Family Medicine

## 2020-07-05 DIAGNOSIS — F4312 Post-traumatic stress disorder, chronic: Secondary | ICD-10-CM | POA: Diagnosis not present

## 2020-07-12 DIAGNOSIS — F4312 Post-traumatic stress disorder, chronic: Secondary | ICD-10-CM | POA: Diagnosis not present

## 2020-10-05 ENCOUNTER — Other Ambulatory Visit: Payer: Medicaid Other

## 2020-10-05 ENCOUNTER — Encounter: Payer: Self-pay | Admitting: Nurse Practitioner

## 2020-10-05 ENCOUNTER — Telehealth (INDEPENDENT_AMBULATORY_CARE_PROVIDER_SITE_OTHER): Payer: Self-pay | Admitting: Nurse Practitioner

## 2020-10-05 VITALS — Temp 101.3°F

## 2020-10-05 DIAGNOSIS — A6000 Herpesviral infection of urogenital system, unspecified: Secondary | ICD-10-CM | POA: Insufficient documentation

## 2020-10-05 DIAGNOSIS — R509 Fever, unspecified: Secondary | ICD-10-CM | POA: Insufficient documentation

## 2020-10-05 MED ORDER — ONDANSETRON 4 MG PO TBDP
4.0000 mg | ORAL_TABLET | Freq: Three times a day (TID) | ORAL | 0 refills | Status: DC | PRN
Start: 2020-10-05 — End: 2020-12-27

## 2020-10-05 MED ORDER — BENZONATATE 200 MG PO CAPS
200.0000 mg | ORAL_CAPSULE | Freq: Two times a day (BID) | ORAL | 0 refills | Status: DC | PRN
Start: 2020-10-05 — End: 2020-12-27

## 2020-10-05 MED ORDER — ALBUTEROL SULFATE HFA 108 (90 BASE) MCG/ACT IN AERS
2.0000 | INHALATION_SPRAY | Freq: Four times a day (QID) | RESPIRATORY_TRACT | 2 refills | Status: DC | PRN
Start: 2020-10-05 — End: 2022-10-25

## 2020-10-05 MED ORDER — HYDROCOD POLST-CPM POLST ER 10-8 MG/5ML PO SUER: 5.0000 mL | Freq: Two times a day (BID) | ORAL | 0 refills | Status: DC | PRN

## 2020-10-05 NOTE — Patient Instructions (Signed)
COVID-19 COVID-19 is a respiratory infection that is caused by a virus called severe acute respiratory syndrome coronavirus 2 (SARS-CoV-2). The disease is also known as coronavirus disease or novel coronavirus. In some people, the virus may not cause any symptoms. In others, it may cause a serious infection. The infection can get worse quickly and can lead to complications, such as:  Pneumonia, or infection of the lungs.  Acute respiratory distress syndrome or ARDS. This is a condition in which fluid build-up in the lungs prevents the lungs from filling with air and passing oxygen into the blood.  Acute respiratory failure. This is a condition in which there is not enough oxygen passing from the lungs to the body or when carbon dioxide is not passing from the lungs out of the body.  Sepsis or septic shock. This is a serious bodily reaction to an infection.  Blood clotting problems.  Secondary infections due to bacteria or fungus.  Organ failure. This is when your body's organs stop working. The virus that causes COVID-19 is contagious. This means that it can spread from person to person through droplets from coughs and sneezes (respiratory secretions). What are the causes? This illness is caused by a virus. You may catch the virus by:  Breathing in droplets from an infected person. Droplets can be spread by a person breathing, speaking, singing, coughing, or sneezing.  Touching something, like a table or a doorknob, that was exposed to the virus (contaminated) and then touching your mouth, nose, or eyes. What increases the risk? Risk for infection You are more likely to be infected with this virus if you:  Are within 6 feet (2 meters) of a person with COVID-19.  Provide care for or live with a person who is infected with COVID-19.  Spend time in crowded indoor spaces or live in shared housing. Risk for serious illness You are more likely to become seriously ill from the virus if you:   Are 50 years of age or older. The higher your age, the more you are at risk for serious illness.  Live in a nursing home or long-term care facility.  Have cancer.  Have a long-term (chronic) disease such as: ? Chronic lung disease, including chronic obstructive pulmonary disease or asthma. ? A long-term disease that lowers your body's ability to fight infection (immunocompromised). ? Heart disease, including heart failure, a condition in which the arteries that lead to the heart become narrow or blocked (coronary artery disease), a disease which makes the heart muscle thick, weak, or stiff (cardiomyopathy). ? Diabetes. ? Chronic kidney disease. ? Sickle cell disease, a condition in which red blood cells have an abnormal "sickle" shape. ? Liver disease.  Are obese. What are the signs or symptoms? Symptoms of this condition can range from mild to severe. Symptoms may appear any time from 2 to 14 days after being exposed to the virus. They include:  A fever or chills.  A cough.  Difficulty breathing.  Headaches, body aches, or muscle aches.  Runny or stuffy (congested) nose.  A sore throat.  New loss of taste or smell. Some people may also have stomach problems, such as nausea, vomiting, or diarrhea. Other people may not have any symptoms of COVID-19. How is this diagnosed? This condition may be diagnosed based on:  Your signs and symptoms, especially if: ? You live in an area with a COVID-19 outbreak. ? You recently traveled to or from an area where the virus is common. ? You   provide care for or live with a person who was diagnosed with COVID-19. ? You were exposed to a person who was diagnosed with COVID-19.  A physical exam.  Lab tests, which may include: ? Taking a sample of fluid from the back of your nose and throat (nasopharyngeal fluid), your nose, or your throat using a swab. ? A sample of mucus from your lungs (sputum). ? Blood tests.  Imaging tests, which  may include, X-rays, CT scan, or ultrasound. How is this treated? At present, there is no medicine to treat COVID-19. Medicines that treat other diseases are being used on a trial basis to see if they are effective against COVID-19. Your health care provider will talk with you about ways to treat your symptoms. For most people, the infection is mild and can be managed at home with rest, fluids, and over-the-counter medicines. Treatment for a serious infection usually takes places in a hospital intensive care unit (ICU). It may include one or more of the following treatments. These treatments are given until your symptoms improve.  Receiving fluids and medicines through an IV.  Supplemental oxygen. Extra oxygen is given through a tube in the nose, a face mask, or a hood.  Positioning you to lie on your stomach (prone position). This makes it easier for oxygen to get into the lungs.  Continuous positive airway pressure (CPAP) or bi-level positive airway pressure (BPAP) machine. This treatment uses mild air pressure to keep the airways open. A tube that is connected to a motor delivers oxygen to the body.  Ventilator. This treatment moves air into and out of the lungs by using a tube that is placed in your windpipe.  Tracheostomy. This is a procedure to create a hole in the neck so that a breathing tube can be inserted.  Extracorporeal membrane oxygenation (ECMO). This procedure gives the lungs a chance to recover by taking over the functions of the heart and lungs. It supplies oxygen to the body and removes carbon dioxide. Follow these instructions at home: Lifestyle  If you are sick, stay home except to get medical care. Your health care provider will tell you how long to stay home. Call your health care provider before you go for medical care.  Rest at home as told by your health care provider.  Do not use any products that contain nicotine or tobacco, such as cigarettes, e-cigarettes, and  chewing tobacco. If you need help quitting, ask your health care provider.  Return to your normal activities as told by your health care provider. Ask your health care provider what activities are safe for you. General instructions  Take over-the-counter and prescription medicines only as told by your health care provider.  Drink enough fluid to keep your urine pale yellow.  Keep all follow-up visits as told by your health care provider. This is important. How is this prevented?  There is no vaccine to help prevent COVID-19 infection. However, there are steps you can take to protect yourself and others from this virus. To protect yourself:   Do not travel to areas where COVID-19 is a risk. The areas where COVID-19 is reported change often. To identify high-risk areas and travel restrictions, check the CDC travel website: wwwnc.cdc.gov/travel/notices  If you live in, or must travel to, an area where COVID-19 is a risk, take precautions to avoid infection. ? Stay away from people who are sick. ? Wash your hands often with soap and water for 20 seconds. If soap and water   are not available, use an alcohol-based hand sanitizer. ? Avoid touching your mouth, face, eyes, or nose. ? Avoid going out in public, follow guidance from your state and local health authorities. ? If you must go out in public, wear a cloth face covering or face mask. Make sure your mask covers your nose and mouth. ? Avoid crowded indoor spaces. Stay at least 6 feet (2 meters) away from others. ? Disinfect objects and surfaces that are frequently touched every day. This may include:  Counters and tables.  Doorknobs and light switches.  Sinks and faucets.  Electronics, such as phones, remote controls, keyboards, computers, and tablets. To protect others: If you have symptoms of COVID-19, take steps to prevent the virus from spreading to others.  If you think you have a COVID-19 infection, contact your health care  provider right away. Tell your health care team that you think you may have a COVID-19 infection.  Stay home. Leave your house only to seek medical care. Do not use public transport.  Do not travel while you are sick.  Wash your hands often with soap and water for 20 seconds. If soap and water are not available, use alcohol-based hand sanitizer.  Stay away from other members of your household. Let healthy household members care for children and pets, if possible. If you have to care for children or pets, wash your hands often and wear a mask. If possible, stay in your own room, separate from others. Use a different bathroom.  Make sure that all people in your household wash their hands well and often.  Cough or sneeze into a tissue or your sleeve or elbow. Do not cough or sneeze into your hand or into the air.  Wear a cloth face covering or face mask. Make sure your mask covers your nose and mouth. Where to find more information  Centers for Disease Control and Prevention: www.cdc.gov/coronavirus/2019-ncov/index.html  World Health Organization: www.who.int/health-topics/coronavirus Contact a health care provider if:  You live in or have traveled to an area where COVID-19 is a risk and you have symptoms of the infection.  You have had contact with someone who has COVID-19 and you have symptoms of the infection. Get help right away if:  You have trouble breathing.  You have pain or pressure in your chest.  You have confusion.  You have bluish lips and fingernails.  You have difficulty waking from sleep.  You have symptoms that get worse. These symptoms may represent a serious problem that is an emergency. Do not wait to see if the symptoms will go away. Get medical help right away. Call your local emergency services (911 in the [place] Do not drive yourself to the hospital. Let the emergency medical personnel know if you think you have COVID-19. Summary  COVID-19 is a  respiratory infection that is caused by a virus. It is also known as coronavirus disease or novel coronavirus. It can cause serious infections, such as pneumonia, acute respiratory distress syndrome, acute respiratory failure, or sepsis.  The virus that causes COVID-19 is contagious. This means that it can spread from person to person through droplets from breathing, speaking, singing, coughing, or sneezing.  You are more likely to develop a serious illness if you are 50 years of age or older, have a weak immune system, live in a nursing home, or have chronic disease.  There is no medicine to treat COVID-19. Your health care provider will talk with you about ways to treat your symptoms.    Take steps to protect yourself and others from infection. Wash your hands often and disinfect objects and surfaces that are frequently touched every day. Stay away from people who are sick and wear a mask if you are sick. This information is not intended to replace advice given to you by your health care provider. Make sure you discuss any questions you have with your health care provider. Document Revised: 07/31/2019 Document Reviewed: 11/06/2018 Elsevier Patient Education  2020 [employer]  

## 2020-10-05 NOTE — Progress Notes (Signed)
Temp (!) 101.3 F (38.5 C) (Oral)    Subjective:    Patient ID: Victoria Shepard, female    DOB: 09-Nov-1970, 49 y.o.   MRN: [id number]  HPI: Victoria Shepard is a 49 y.o. female  Chief Complaint  Patient presents with  . Cough    Productive cough with think yellow mucous    . Fever       . Vomiting  . Diarrhea  . Generalized Body Aches  . Headache  . Covid Exposure    Patient states that she was exposed on Dec 14 at a Christmas party.   . Shortness of Breath    . This visit was completed via MyChart due to the restrictions of the COVID-19 pandemic. All issues as above were discussed and addressed. Physical exam was done as above through visual confirmation on MyChart. If it was felt that the patient should be evaluated in the office, they were directed there. The patient verbally consented to this visit. . Location of the patient: home . Location of the provider: work . Those involved with this call:  . Provider: Marnee Guarneri, DNP . CMA: Yvonna Alanis, CMA . Front Desk/Registration: Jill Side  . Time spent on call: 20 minutes with patient face to face via video conference. More than 50% of this time was spent in counseling and coordination of care. 15 minutes total spent in review of patient's record and preparation of their chart.  . I verified patient identity using two factors (patient name and date of birth). Patient consents verbally to being seen via telemedicine visit today.    UPPER RESPIRATORY TRACT INFECTION She presents today for symptoms that started 3 days ago and symptoms are getting worse.  Reports cough, diarrhea, fever, SOB occasional, headache, vomiting.   Did have Covid exposure at Christmas part on December 14th.  Has had Covid vaccines back in April, has had flu vaccine.  Started with loss of taste yesterday. Fever: yes -- 101 range Cough: yes Shortness of breath: yes occasional Wheezing: yes Chest pain: no Chest tightness: no Chest  congestion: yes Nasal congestion: yes Runny nose: yes Post nasal drip: yes Sneezing: no Sore throat: yes Swollen glands: no Sinus pressure: yes Headache: yes Face pain: no Toothache: no Ear pain: none Ear pressure: none Eyes red/itching:no Eye drainage/crusting: no  Vomiting: yes Rash: no Fatigue: yes Sick contacts: yes Strep contacts: no  Context: worse Recurrent sinusitis: no Relief with OTC cold/cough medications: Halls and Vicks vapor rub  Treatments attempted: has not taken anything  Relevant past medical, surgical, family and social history reviewed and updated as indicated. Interim medical history since our last visit reviewed. Allergies and medications reviewed and updated.  Review of Systems  Constitutional: Positive for chills, fatigue and fever. Negative for activity change, appetite change and diaphoresis.  HENT: Positive for congestion, postnasal drip, rhinorrhea, sinus pressure and sore throat. Negative for ear discharge, ear pain, facial swelling, sinus pain, sneezing and voice change.   Eyes: Negative for pain and visual disturbance.  Respiratory: Positive for cough, chest tightness, shortness of breath and wheezing.   Cardiovascular: Negative for chest pain, palpitations and leg swelling.  Gastrointestinal: Positive for diarrhea, nausea and vomiting. Negative for abdominal distention, abdominal pain and constipation.  Endocrine: Negative.   Musculoskeletal: Positive for myalgias.  Neurological: Negative for dizziness, numbness and headaches.  Psychiatric/Behavioral: Negative.     Per HPI unless specifically indicated above     Objective:    Temp (!) 101.3  F (38.5 C) (Oral)   Wt Readings from Last 3 Encounters:  05/24/20 212 lb (96.2 kg)  12/18/19 194 lb (88 kg)  11/27/19 198 lb (89.8 kg)    Physical Exam Vitals and nursing note reviewed.  Constitutional:      General: She is awake. She is not in acute distress.    Appearance: She is  well-developed. She is obese. She is ill-appearing. She is not toxic-appearing.  HENT:     Head: Normocephalic.     Right Ear: Hearing normal.     Left Ear: Hearing normal.  Eyes:     General: Lids are normal.        Right eye: No discharge.        Left eye: No discharge.     Conjunctiva/sclera: Conjunctivae normal.  Pulmonary:     Effort: Pulmonary effort is normal. No accessory muscle usage or respiratory distress.     Comments: Able to talk without SOB.  Intermittent non productive cough noted.  Unable to auscultate due to virtual exam only.  Musculoskeletal:     Cervical back: Normal range of motion.  Neurological:     Mental Status: [name] and oriented to person, place, and time.  Psychiatric:        Attention and Perception: Attention normal.        Mood and Affect: Mood normal.        Behavior: Behavior normal. Behavior is cooperative.        Thought Content: Thought content normal.        Judgment: Judgment normal.     Results for orders placed or performed in visit on 02/03/20  HIV Antibody (routine testing w rflx)  Result Value Ref Range   HIV Screen 4th Generation wRfx Non Reactive Non Reactive      Assessment & Plan:   Problem List Items Addressed This Visit      Other   Fever - Primary    Acute, highly suspicious for Covid.  Has had Covid testing, she is aware to call provider with results -- discussed and educated on MAB infusion & will contact team and add to call list if result returns positive, which suspect it will.  At this time have recommended she use OTC medications for symptoms relief, including Tylenol for fever + Vitamin D3, Zinc, Quercetin, and Vitamin C daily.  Recommend she self quarantine until results return and if positive continue quarantine for 10 days.  Will send in scripts for Tessalon, Tussionex, Albuterol inhaler, and Zofran to assist with symptoms.  Recommend if worsening SOB or presentation of CP or overall worsening symptoms  immediately go to ER.  Will obtain outpatient flu testing as well.  Return in 2 weeks.      Relevant Orders   Veritor Flu A/B Waived      I discussed the assessment and treatment plan with the patient. The patient was provided an opportunity to ask questions and all were answered. The patient agreed with the plan and demonstrated an understanding of the instructions.   The patient was advised to call back or seek an in-person evaluation if the symptoms worsen or if the condition fails to improve as anticipated.   I provided 21+ minutes of time during this encounter.  Follow up plan: Return in about 2 weeks (around 10/19/2020) for FEVER.

## 2020-10-05 NOTE — Addendum Note (Signed)
Addended by: Georgina Peer on: 10/05/2020 02:54 PM   Modules accepted: Orders

## 2020-10-05 NOTE — Assessment & Plan Note (Signed)
Acute, highly suspicious for Covid.  Has had Covid testing, she is aware to call provider with results -- discussed and educated on MAB infusion & will contact team and add to call list if result returns positive, which suspect it will.  At this time have recommended she use OTC medications for symptoms relief, including Tylenol for fever + Vitamin D3, Zinc, Quercetin, and Vitamin C daily.  Recommend she self quarantine until results return and if positive continue quarantine for 10 days.  Will send in scripts for Tessalon, Tussionex, Albuterol inhaler, and Zofran to assist with symptoms.  Recommend if worsening SOB or presentation of CP or overall worsening symptoms immediately go to ER.  Will obtain outpatient flu testing as well.  Return in 2 weeks.

## 2020-10-05 NOTE — Addendum Note (Signed)
Addended by: Marnee Guarneri T on: 10/05/2020 01:30 PM   Modules accepted: Orders

## 2020-10-05 NOTE — Telephone Encounter (Signed)
Spoke with Pt , Stated she would come at 3:00 today

## 2020-10-06 LAB — VERITOR FLU A/B WAIVED
Influenza A: NEGATIVE
Influenza B: NEGATIVE

## 2020-10-06 NOTE — Progress Notes (Signed)
Pt stated will schedule once Covid test comes back

## 2020-10-06 NOTE — Progress Notes (Signed)
Contacted via MyChart   Flu testing was negative, awaiting Covid testing as I do believe that is underlying issue.

## 2020-10-07 LAB — SARS-COV-2, NAA 2 DAY TAT

## 2020-10-07 LAB — NOVEL CORONAVIRUS, NAA: SARS-CoV-2, NAA: NOT DETECTED

## 2020-10-08 NOTE — Progress Notes (Signed)
Contacted via MyChart   Good evening Kinlie, your Covid testing returned negative.  I hope you are starting to feel better.  If any ongoing symptoms or worsening please let me know.  Have a Merry Christmas!!

## 2020-11-03 ENCOUNTER — Other Ambulatory Visit: Payer: Self-pay | Admitting: Family Medicine

## 2020-11-03 NOTE — Telephone Encounter (Signed)
Please see pharmacy request for alternate medication.   Pharmacy comment: Alternative Requested:PT COPAY IS 1400.

## 2020-11-03 NOTE — Telephone Encounter (Signed)
Routing to provider  

## 2020-11-04 NOTE — Telephone Encounter (Signed)
Please have her check with her insurance to see if something else is covered.

## 2020-11-04 NOTE — Telephone Encounter (Signed)
Tried calling patient. No answer and no VM available. Will try to call again later.  

## 2020-12-27 ENCOUNTER — Ambulatory Visit (INDEPENDENT_AMBULATORY_CARE_PROVIDER_SITE_OTHER): Payer: BC Managed Care – PPO | Admitting: Nurse Practitioner

## 2020-12-27 ENCOUNTER — Ambulatory Visit: Payer: 59 | Admitting: Nurse Practitioner

## 2020-12-27 ENCOUNTER — Other Ambulatory Visit: Payer: Self-pay

## 2020-12-27 ENCOUNTER — Encounter: Payer: Self-pay | Admitting: Nurse Practitioner

## 2020-12-27 VITALS — BP 132/81 | HR 99 | Temp 98.3°F | Wt 209.8 lb

## 2020-12-27 DIAGNOSIS — Z114 Encounter for screening for human immunodeficiency virus [HIV]: Secondary | ICD-10-CM | POA: Diagnosis not present

## 2020-12-27 DIAGNOSIS — F3342 Major depressive disorder, recurrent, in full remission: Secondary | ICD-10-CM | POA: Diagnosis not present

## 2020-12-27 DIAGNOSIS — E7800 Pure hypercholesterolemia, unspecified: Secondary | ICD-10-CM

## 2020-12-27 DIAGNOSIS — K582 Mixed irritable bowel syndrome: Secondary | ICD-10-CM

## 2020-12-27 DIAGNOSIS — F5104 Psychophysiologic insomnia: Secondary | ICD-10-CM

## 2020-12-27 DIAGNOSIS — I10 Essential (primary) hypertension: Secondary | ICD-10-CM

## 2020-12-27 DIAGNOSIS — F419 Anxiety disorder, unspecified: Secondary | ICD-10-CM | POA: Diagnosis not present

## 2020-12-27 DIAGNOSIS — J309 Allergic rhinitis, unspecified: Secondary | ICD-10-CM

## 2020-12-27 MED ORDER — FLUTICASONE PROPIONATE 50 MCG/ACT NA SUSP: 2.0000 | Freq: Every day | NASAL | 6 refills | Status: DC

## 2020-12-27 MED ORDER — LEVOCETIRIZINE DIHYDROCHLORIDE 5 MG PO TABS
5.0000 mg | ORAL_TABLET | Freq: Every evening | ORAL | 4 refills | Status: DC
Start: 2020-12-27 — End: 2021-07-14

## 2020-12-27 MED ORDER — LINACLOTIDE 145 MCG PO CAPS: 145.0000 ug | ORAL_CAPSULE | Freq: Every day | ORAL | 4 refills | Status: DC

## 2020-12-27 MED ORDER — PANTOPRAZOLE SODIUM 40 MG PO TBEC
40.0000 mg | DELAYED_RELEASE_TABLET | Freq: Every day | ORAL | 4 refills | Status: DC
Start: 2020-12-27 — End: 2021-07-14

## 2020-12-27 NOTE — Progress Notes (Signed)
BP 132/81   Pulse 99   Temp 98.3 F (36.8 C) (Oral)   Wt 209 lb 12.8 oz (95.2 kg)   SpO2 99%   BMI 37.16 kg/m    Subjective:    Patient ID: Victoria Shepard, female    DOB: 1971-02-18, 50 y.o.   MRN: [id number]  HPI: Victoria Shepard is a 50 y.o. female  Chief Complaint  Patient presents with  . Medication Refill    Patient states she needs a refill on some medications.   . Labs    Patient states she has an upcoming physical and they do not offer the lab part of the physical. Patient would like to get her labs done at today's visit. Patient states she would like to have HIV lab testing at today's visit as well if possible.    HYPERTENSION Currently not on any medications.   Hypertension status: stable  Satisfied with current treatment? yes Duration of hypertension: chronic BP monitoring frequency:  not checking BP range:  BP medication side effects:  no Medication compliance: good compliance Aspirin: no Recurrent headaches: no Visual changes: no Palpitations: no Dyspnea: no Chest pain: no Lower extremity edema: no Dizzy/lightheaded: no   DEPRESSION Currently is not taking Celexa 40 MG and Klonopin PRN -- she tapered herself off of this.  She does see therapy, Apolonio Schneiders -- every week.  Takes Melatonin 10 MG for sleep. Mood status: stable Satisfied with current treatment?: yes Symptom severity: mild  Duration of current treatment : chronic Side effects: no Medication compliance: good compliance Psychotherapy/counseling: yes current Previous psychiatric medications:  Depressed mood: no Anxious mood: a little with her mother having stroke Anhedonia: no Significant weight loss or gain: no Insomnia: none Fatigue: no Feelings of worthlessness or guilt: no Impaired concentration/indecisiveness: no Suicidal ideations: no Hopelessness: no Crying spells: no Depression screen [facility] 2/9 12/27/2020 10/05/2020 05/24/2020 12/18/2019 11/20/2019  Decreased Interest 0 0 0 0 0  Down,  Depressed, Hopeless 0 0 0 0 0  PHQ - 2 Score 0 0 0 0 0  Altered sleeping 0 - 0 1 3  Tired, decreased energy 0 - 1 1 1   Change in appetite 0 - 2 0 1  Feeling bad or failure about yourself  0 - 0 0 1  Trouble concentrating 0 - 0 0 0  Moving slowly or fidgety/restless 0 - 0 0 0  Suicidal thoughts 0 - 0 0 0  PHQ-9 Score 0 - 3 2 6   Difficult doing work/chores Not difficult at all - - Not difficult at all Not difficult at all   GAD 7 : Generalized Anxiety Score 12/27/2020 05/24/2020 12/18/2019 11/20/2019  Nervous, Anxious, on Edge 1 0 0 1  Control/stop worrying 0 0 0 1  Worry too much - different things 0 0 0 1  Trouble relaxing 0 0 0 1  Restless 0 0 0 0  Easily annoyed or irritable 0 0 1 0  Afraid - awful might happen 0 0 0 1  Total GAD 7 Score 1 0 1 5  Anxiety Difficulty - - Not difficult at all Not difficult at all   GERD Needs refills on Protonix, which offers benefit.  Linzess for IBS -- more constipation. GERD control status: stableSatisfied with current treatment? yes Heartburn frequency: a few times a week Medication side effects: no  Medication compliance: stable Previous GERD medications: none Antacid use frequency:  none Dysphagia: no Odynophagia:  no Hematemesis: no Blood in stool: no EGD: no  Relevant past medical, surgical, family and social history reviewed and updated as indicated. Interim medical history since our last visit reviewed. Allergies and medications reviewed and updated.  Review of Systems  Constitutional: Negative for activity change, appetite change, diaphoresis, fatigue and fever.  Respiratory: Negative for cough, chest tightness and shortness of breath.   Cardiovascular: Negative for chest pain, palpitations and leg swelling.  Gastrointestinal: Negative.   Endocrine: Negative for cold intolerance, heat intolerance, polydipsia, polyphagia and polyuria.  Neurological: Negative.   Psychiatric/Behavioral: Negative.     Per HPI unless specifically  indicated above     Objective:    BP 132/81   Pulse 99   Temp 98.3 F (36.8 C) (Oral)   Wt 209 lb 12.8 oz (95.2 kg)   SpO2 99%   BMI 37.16 kg/m   Wt Readings from Last 3 Encounters:  12/27/20 209 lb 12.8 oz (95.2 kg)  05/24/20 212 lb (96.2 kg)  12/18/19 194 lb (88 kg)    Physical Exam Vitals and nursing note reviewed.  Constitutional:      General: She is awake. She is not in acute distress.    Appearance: She is well-developed and well-groomed. She is morbidly obese. She is not ill-appearing.  HENT:     Head: Normocephalic.     Right Ear: Hearing normal.     Left Ear: Hearing normal.  Eyes:     General: Lids are normal.        Right eye: No discharge.        Left eye: No discharge.     Conjunctiva/sclera: Conjunctivae normal.     Pupils: Pupils are equal, round, and reactive to light.  Neck:     Thyroid: No thyromegaly.     Vascular: No carotid bruit.  Cardiovascular:     Rate and Rhythm: Normal rate and regular rhythm.     Heart sounds: Normal heart sounds. No murmur heard. No gallop.   Pulmonary:     Effort: Pulmonary effort is normal. No accessory muscle usage or respiratory distress.     Breath sounds: Normal breath sounds.  Abdominal:     General: Bowel sounds are normal.     Palpations: Abdomen is soft. There is no hepatomegaly or splenomegaly.  Musculoskeletal:     Cervical back: Normal range of motion and neck supple.     Right lower leg: No edema.     Left lower leg: No edema.  Lymphadenopathy:     Cervical: No cervical adenopathy.  Skin:    General: Skin is warm and dry.  Neurological:     Mental Status: [name] and oriented to person, place, and time.  Psychiatric:        Attention and Perception: Attention normal.        Mood and Affect: Mood normal.        Behavior: Behavior normal. Behavior is cooperative.        Thought Content: Thought content normal.        Judgment: Judgment normal.     Results for orders placed or performed in  visit on 10/05/20  Novel Coronavirus, NAA ([facility])   Specimen: Nasopharyngeal(NP) swabs in vial transport medium  Result Value Ref Range   SARS-CoV-2, NAA Not Detected Not Detected  SARS-COV-2, NAA 2 DAY TAT  Result Value Ref Range   SARS-CoV-2, NAA 2 DAY TAT Performed   Veritor Flu A/B Waived  Result Value Ref Range   Influenza A Negative Negative   Influenza B  Negative Negative      Assessment & Plan:   Problem List Items Addressed This Visit      Cardiovascular and Mediastinum   Hypertension    Chronic, stable.  No current medications.  Recommend she monitor BP at least a few mornings a week at home and document.  DASH diet at home.  Labs today: CBC, CMP, TSH.  Return in one year.       Relevant Orders   CBC with Differential/Platelet   Comprehensive metabolic panel   TSH     Respiratory   Allergic rhinitis, mild    Chronic, ongoing. Continue current medication regimen and adjust as needed.  Refills sent in.        Digestive   IBS (irritable bowel syndrome)    Chronic, ongoing, stable on Linzess.  Will send in refills on this and obtain labs today to include CMP and TSH.      Relevant Medications   linaclotide (LINZESS) 145 MCG CAPS capsule   pantoprazole (PROTONIX) 40 MG tablet     Other   Anxiety    Chronic, stable.  Currently doing well without medication on board and weaned self off Klonopin.  Recommend she continue to attend therapy, which is offering benefit.  Would consider restart Celexa if needed in future, would avoid return to benzo which was discussed with her today.  Denies SI/HI.  Return in one year, sooner if worsening mood.      Depression - Primary    Chronic, stable.  Currently doing well without medication on board.  Recommend she continue to attend therapy, which is offering benefit.  Would consider restart Celexa if needed in future.  Denies SI/HI.  Return in one year, sooner if worsening mood.      High cholesterol    No current  medications, recheck lipid panel today and initiate medication as needed.  Continue diet and weight loss focus at this time.      Relevant Orders   Lipid Panel w/o Chol/HDL Ratio   Morbid obesity (HCC)    BMI 37.16 with HTN and HLD.  Recommended eating smaller high protein, low fat meals more frequently and exercising 30 mins a day 5 times a week with a goal of 10-15lb weight loss in the next 3 months. Patient voiced their understanding and motivation to adhere to these recommendations.       Chronic insomnia    Chronic, stable on Melatonin, recommend continue this regimen and will plan to adjust as needed.       Other Visit Diagnoses    Encounter for screening for HIV       HIV screen on labs today   Relevant Orders   HIV Antibody (routine testing w rflx)       Follow up plan: Return in about 1 year (around 12/27/2021) for GERD and IBS, HTN.

## 2020-12-27 NOTE — Assessment & Plan Note (Signed)
Chronic, stable.  Currently doing well without medication on board.  Recommend she continue to attend therapy, which is offering benefit.  Would consider restart Celexa if needed in future.  Denies SI/HI.  Return in one year, sooner if worsening mood.

## 2020-12-27 NOTE — Assessment & Plan Note (Signed)
Chronic, stable.  Currently doing well without medication on board and weaned self off Klonopin.  Recommend she continue to attend therapy, which is offering benefit.  Would consider restart Celexa if needed in future, would avoid return to benzo which was discussed with her today.  Denies SI/HI.  Return in one year, sooner if worsening mood.

## 2020-12-27 NOTE — Patient Instructions (Signed)
[email or web address]">  Generalized Anxiety Disorder, Adult Generalized anxiety disorder (GAD) is a mental health condition. Unlike normal worries, anxiety related to GAD is not triggered by a specific event. These worries do not fade or get better with time. GAD interferes with relationships, work, and school. GAD symptoms can vary from mild to severe. People with severe GAD can have intense waves of anxiety with physical symptoms that are similar to panic attacks. What are the causes? The exact cause of GAD is not known, but the following are believed to have an impact:  Differences in natural brain chemicals.  Genes passed down from parents to children.  Differences in the way threats are perceived.  Development during childhood.  Personality. What increases the risk? The following factors may make you more likely to develop this condition:  Being female.  Having a family history of anxiety disorders.  Being very shy.  Experiencing very stressful life events, such as the death of a loved one.  Having a very stressful family environment. What are the signs or symptoms? People with GAD often worry excessively about many things in their lives, such as their health and family. Symptoms may also include:  Mental and emotional symptoms: ? Worrying excessively about natural disasters. ? Fear of being late. ? Difficulty concentrating. ? Fears that others are judging your performance.  Physical symptoms: ? Fatigue. ? Headaches, muscle tension, muscle twitches, trembling, or feeling shaky. ? Feeling like your heart is pounding or beating very fast. ? Feeling out of breath or like you cannot take a deep breath. ? Having trouble falling asleep or staying asleep, or experiencing restlessness. ? Sweating. ? Nausea, diarrhea, or irritable bowel syndrome (IBS).  Behavioral symptoms: ? Experiencing erratic moods or irritability. ? Avoidance of new situations. ? Avoidance of  people. ? Extreme difficulty making decisions. How is this diagnosed? This condition is diagnosed based on your symptoms and medical history. You will also have a physical exam. Your health care provider may perform tests to rule out other possible causes of your symptoms. To be diagnosed with GAD, a person must have anxiety that:  Is out of his or her control.  Affects several different aspects of his or her life, such as work and relationships.  Causes distress that makes him or her unable to take part in normal activities.  Includes at least three symptoms of GAD, such as restlessness, fatigue, trouble concentrating, irritability, muscle tension, or sleep problems. Before your health care provider can confirm a diagnosis of GAD, these symptoms must be present more days than they are not, and they must last for 6 months or longer. How is this treated? This condition may be treated with:  Medicine. Antidepressant medicine is usually prescribed for long-term daily control. Anti-anxiety medicines may be added in severe cases, especially when panic attacks occur.  Talk therapy (psychotherapy). Certain types of talk therapy can be helpful in treating GAD by providing support, education, and guidance. Options include: ? Cognitive behavioral therapy (CBT). People learn coping skills and self-calming techniques to ease their physical symptoms. They learn to identify unrealistic thoughts and behaviors and to replace them with more appropriate thoughts and behaviors. ? Acceptance and commitment therapy (ACT). This treatment teaches people how to be mindful as a way to cope with unwanted thoughts and feelings. ? Biofeedback. This process trains you to manage your body's response (physiological response) through breathing techniques and relaxation methods. You will work with a therapist while machines are used to monitor your physical   symptoms.  Stress management techniques. These include yoga,  meditation, and exercise. A mental health specialist can help determine which treatment is best for you. Some people see improvement with one type of therapy. However, other people require a combination of therapies.   Follow these instructions at home: Lifestyle  Maintain a consistent routine and schedule.  Anticipate stressful situations. Create a plan, and allow extra time to work with your plan.  Practice stress management or self-calming techniques that you have learned from your therapist or your health care provider. General instructions  Take over-the-counter and prescription medicines only as told by your health care provider.  Understand that you are likely to have setbacks. Accept this and be kind to yourself as you persist to take better care of yourself.  Recognize and accept your accomplishments, even if you judge them as small.  Keep all follow-up visits as told by your health care provider. This is important. Contact a health care provider if:  Your symptoms do not get better.  Your symptoms get worse.  You have signs of depression, such as: ? A persistently sad or irritable mood. ? Loss of enjoyment in activities that used to bring you joy. ? Change in weight or eating. ? Changes in sleeping habits. ? Avoiding friends or family members. ? Loss of energy for normal tasks. ? Feelings of guilt or worthlessness. Get help right away if:  You have serious thoughts about hurting yourself or others. If you ever feel like you may hurt yourself or others, or have thoughts about taking your own life, get help right away. Go to your nearest emergency department or:  Call your local emergency services (911 in the [place]  Call a suicide crisis helpline, such as the National Suicide Prevention Lifeline at 1-800-273-8255. This is open 24 hours a day in the [place]  Text the Crisis Text Line at 741741 (in the [place] Summary  Generalized anxiety disorder (GAD) is a mental  health condition that involves worry that is not triggered by a specific event.  People with GAD often worry excessively about many things in their lives, such as their health and family.  GAD may cause symptoms such as restlessness, trouble concentrating, sleep problems, frequent sweating, nausea, diarrhea, headaches, and trembling or muscle twitching.  A mental health specialist can help determine which treatment is best for you. Some people see improvement with one type of therapy. However, other people require a combination of therapies. This information is not intended to replace advice given to you by your health care provider. Make sure you discuss any questions you have with your health care provider. Document Revised: 07/22/2019 Document Reviewed: 07/22/2019 Elsevier Patient Education  2021 Elsevier Inc.  

## 2020-12-27 NOTE — Assessment & Plan Note (Signed)
No current medications, recheck lipid panel today and initiate medication as needed.  Continue diet and weight loss focus at this time.

## 2020-12-27 NOTE — Assessment & Plan Note (Signed)
Chronic, ongoing. Continue current medication regimen and adjust as needed.  Refills sent in.

## 2020-12-27 NOTE — Assessment & Plan Note (Signed)
Chronic, stable.  No current medications.  Recommend she monitor BP at least a few mornings a week at home and document.  DASH diet at home.  Labs today: CBC, CMP, TSH.  Return in one year.

## 2020-12-27 NOTE — Assessment & Plan Note (Signed)
Chronic, ongoing, stable on Linzess.  Will send in refills on this and obtain labs today to include CMP and TSH.

## 2020-12-27 NOTE — Assessment & Plan Note (Signed)
Chronic, stable on Melatonin, recommend continue this regimen and will plan to adjust as needed.

## 2020-12-27 NOTE — Assessment & Plan Note (Signed)
BMI 37.16 with HTN and HLD.  Recommended eating smaller high protein, low fat meals more frequently and exercising 30 mins a day 5 times a week with a goal of 10-15lb weight loss in the next 3 months. Patient voiced their understanding and motivation to adhere to these recommendations.

## 2020-12-28 LAB — CBC WITH DIFFERENTIAL/PLATELET
Basophils Absolute: 0.1 10*3/uL (ref 0.0–0.2)
Basos: 1 %
EOS (ABSOLUTE): 0.1 10*3/uL (ref 0.0–0.4)
Eos: 2 %
Hematocrit: 48.3 % — ABNORMAL HIGH (ref 34.0–46.6)
Hemoglobin: 15.7 g/dL (ref 11.1–15.9)
Immature Grans (Abs): 0 10*3/uL (ref 0.0–0.1)
Immature Granulocytes: 0 %
Lymphocytes Absolute: 2.1 10*3/uL (ref 0.7–3.1)
Lymphs: 30 %
MCH: 29.7 pg (ref 26.6–33.0)
MCHC: 32.5 g/dL (ref 31.5–35.7)
MCV: 91 fL (ref 79–97)
Monocytes Absolute: 0.5 10*3/uL (ref 0.1–0.9)
Monocytes: 7 %
Neutrophils Absolute: 4.2 10*3/uL (ref 1.4–7.0)
Neutrophils: 60 %
Platelets: 338 10*3/uL (ref 150–450)
RBC: 5.29 x10E6/uL — ABNORMAL HIGH (ref 3.77–5.28)
RDW: 12.9 % (ref 11.7–15.4)
WBC: 7 10*3/uL (ref 3.4–10.8)

## 2020-12-28 LAB — COMPREHENSIVE METABOLIC PANEL
ALT: 22 IU/L (ref 0–32)
AST: 21 IU/L (ref 0–40)
Albumin/Globulin Ratio: 1.9 (ref 1.2–2.2)
Albumin: 5 g/dL — ABNORMAL HIGH (ref 3.8–4.8)
Alkaline Phosphatase: 78 IU/L (ref 44–121)
BUN/Creatinine Ratio: 8 — ABNORMAL LOW (ref 9–23)
BUN: 8 mg/dL (ref 6–24)
Bilirubin Total: 1.3 mg/dL — ABNORMAL HIGH (ref 0.0–1.2)
CO2: 21 mmol/L (ref 20–29)
Calcium: 9.7 mg/dL (ref 8.7–10.2)
Chloride: 98 mmol/L (ref 96–106)
Creatinine, Ser: 0.96 mg/dL (ref 0.57–1.00)
Globulin, Total: 2.7 g/dL (ref 1.5–4.5)
Glucose: 112 mg/dL — ABNORMAL HIGH (ref 65–99)
Potassium: 4.3 mmol/L (ref 3.5–5.2)
Sodium: 138 mmol/L (ref 134–144)
Total Protein: 7.7 g/dL (ref 6.0–8.5)
eGFR: 73 mL/min/{1.73_m2} (ref >59)

## 2020-12-28 LAB — LIPID PANEL W/O CHOL/HDL RATIO
Cholesterol, Total: 236 mg/dL — ABNORMAL HIGH (ref 100–199)
HDL: 42 mg/dL (ref >39)
LDL Chol Calc (NIH): 163 mg/dL — ABNORMAL HIGH (ref 0–99)
Triglycerides: 171 mg/dL — ABNORMAL HIGH (ref 0–149)
VLDL Cholesterol Cal: 31 mg/dL (ref 5–40)

## 2020-12-28 LAB — TSH: TSH: 3.12 u[IU]/mL (ref 0.450–4.500)

## 2020-12-28 LAB — HIV ANTIBODY (ROUTINE TESTING W REFLEX): HIV Screen 4th Generation wRfx: NONREACTIVE

## 2020-12-28 NOTE — Progress Notes (Signed)
Contacted via MyChart   Good evening [name], your labs have returned.  Overall they remain stable and HIV is negative.  The only concerns are sugar was mildly elevated at 112, recommend heavy focus on diet and regular exercise to help lower this and would benefit from A1c (diabetes testing) check next visit if ongoing elevations.  Your cholesterol is still high, but continued recommendations to make lifestyle changes. Your LDL is above normal. The LDL is the bad cholesterol. Over time and in combination with inflammation and other factors, this contributes to plaque which in turn may lead to stroke and/or heart attack down the road. Sometimes high LDL is primarily genetic, and people might be eating all the right foods but still have high numbers. Other times, there is room for improvement in one's diet and eating healthier can bring this number down and potentially reduce one's risk of heart attack and/or stroke.   To reduce your LDL, Remember - more fruits and vegetables, more fish, and limit red meat and dairy products. More soy, nuts, beans, barley, lentils, oats and plant sterol ester enriched margarine instead of butter. I also encourage eliminating sugar and processed food. Remember, shop on the outside of the grocery store and visit your [employer]. If you would like to talk with me about dietary changes for your cholesterol, please let me know. We should recheck your cholesterol in 6 months.  Any questions? Keep being awesome!!  Thank you for allowing me to participate in your care. Kindest regards, Pheng Prokop

## 2021-01-17 NOTE — Progress Notes (Deleted)
PCP:  Jon Billings, NP   No chief complaint on file.    HPI:      [name] is a 50 y.o. G3P3 who LMP was No LMP recorded. Patient has had an implant., presents today for her annual examination.  Her menses are irregular since nexplanon placement 12/20. Was having heavy bleeding for a few wks, now daily spotting/brown d/c.  Was amenorrheic with 3 nexplanons in past. Dysmenorrhea none.   Sex activity: single partner, contraception -  Nexplanon placed 09/15/19 Last Pap: October 24, 2018  Results were: no abnormalities /neg HPV DNA  Hx of STDs: HSV, takes valtrex prn. Had neg HIV testing 12/20. Wants again 6 months after potential exposure (10/20), although partner was HIV neg at that time, but has pt worried.   Last mammogram: December 18, 2018  Results were: normal--routine follow-up in 12 months There is no FH of breast cancer. There is no FH of ovarian cancer. The patient does do self-breast exams.  Tobacco use: The patient denies current or previous tobacco use. Alcohol use: none No drug use.  Exercise: very active Is losing wt with wt watchers and exercise. Would like to try a couple months phentermine. Discussed with Dr. Kenton Kingfisher in the past. Not on HTN meds   She does get adequate calcium but not Vitamin D in her diet. Labs with PCP  Past Medical History:  Diagnosis Date  . Anxiety   . Depression   . Heart murmur   . Herpes   . History of abnormal mammogram 2010  . History of cellulitis    belly button  . Hypercholesteremia   . Hypertension   . Nexplanon in place    placed 10/11/14, remove 10/11/17  . OCD (obsessive compulsive disorder)     Past Surgical History:  Procedure Laterality Date  . BIOPSY BREAST  04/2010   was normal  . BREAST EXCISIONAL BIOPSY Left 2011   benign  . BREAST SURGERY Left 2011  . COLONOSCOPY  2010  . COSMETIC SURGERY  2012  . HERNIA REPAIR  2012  . LIPOSUCTION  2012  . MM DUCTOGRAM BILAT R/S  2010   due to bloody breast  discharge    Family History  Problem Relation Age of Onset  . Hypertension Mother   . Hyperlipidemia Mother   . Diabetes Mother   . Stroke Mother   . Heart disease Father        CABG  . Hypertension Father   . Mental illness Brother        anxiety  . Cancer Maternal Grandmother        lung  . Stroke Paternal Grandmother   . Stroke Paternal Grandfather     Social History   Socioeconomic History  . Marital status: Divorced    Spouse name: Not on file  . Number of children: Not on file  . Years of education: Not on file  . Highest education level: Not on file  Occupational History  . Not on file  Tobacco Use  . Smoking status: Never Smoker  . Smokeless tobacco: Never Used  Vaping Use  . Vaping Use: Never used  Substance and Sexual Activity  . Alcohol use: No  . Drug use: No  . Sexual activity: Yes    Birth control/protection: Implant  Other Topics Concern  . Not on file  Social History Narrative  . Not on file   Social Determinants of Health   Financial Resource Strain: Not  on file  Food Insecurity: Not on file  Transportation Needs: Not on file  Physical Activity: Not on file  Stress: Not on file  Social Connections: Not on file  Intimate Partner Violence: Not on file     Current Outpatient Medications:  .  albuterol (VENTOLIN HFA) 108 (90 Base) MCG/ACT inhaler, Inhale 2 puffs into the lungs every 6 (six) hours as needed for wheezing or shortness of breath., Disp: 18 g, Rfl: 2 .  fluticasone (FLONASE) 50 MCG/ACT nasal spray, Place 2 sprays into both nostrils daily., Disp: 16 g, Rfl: 6 .  levocetirizine (XYZAL) 5 MG tablet, Take 1 tablet (5 mg total) by mouth every evening., Disp: 90 tablet, Rfl: 4 .  linaclotide (LINZESS) 145 MCG CAPS capsule, Take 1 capsule (145 mcg total) by mouth daily before breakfast., Disp: 90 capsule, Rfl: 4 .  MELATONIN PO, Take 1 Dose by mouth daily., Disp: , Rfl:  .  mometasone (ELOCON) 0.1 % cream, APPLY DAILY (Patient not  taking: Reported on 12/27/2020), Disp: 45 g, Rfl: 1 .  pantoprazole (PROTONIX) 40 MG tablet, Take 1 tablet (40 mg total) by mouth daily., Disp: 90 tablet, Rfl: 4 .  phentermine (ADIPEX-P) 37.5 MG tablet, Take 1 tablet (37.5 mg total) by mouth daily before breakfast. (Patient not taking: Reported on 12/27/2020), Disp: 30 tablet, Rfl: 1 .  valACYclovir (VALTREX) 500 MG tablet, TAKE 1 TABLET BY MOUTH EVERY DAY, Disp: 90 tablet, Rfl: 1 .  Vitamin D, Ergocalciferol, (DRISDOL) 1.25 MG (50000 UNIT) CAPS capsule, TAKE 1 CAPSULE (50,000 UNITS TOTAL) BY MOUTH EVERY 7 (SEVEN) DAYS., Disp: 12 capsule, Rfl: 1  Current Facility-Administered Medications:  .  etonogestrel (NEXPLANON) implant 68 mg, 68 mg, Subdermal, Once, Jabes Primo B, PA-C     ROS:  Review of Systems  Constitutional: Negative for fatigue, fever and unexpected weight change.  Respiratory: Negative for cough, shortness of breath and wheezing.   Cardiovascular: Negative for chest pain, palpitations and leg swelling.  Gastrointestinal: Negative for blood in stool, constipation, diarrhea, nausea and vomiting.  Endocrine: Negative for cold intolerance, heat intolerance and polyuria.  Genitourinary: Negative for dyspareunia, dysuria, flank pain, frequency, genital sores, hematuria, menstrual problem, pelvic pain, urgency, vaginal bleeding, vaginal discharge and vaginal pain.  Musculoskeletal: Negative for back pain, joint swelling and myalgias.  Skin: Negative for rash.  Neurological: Negative for dizziness, syncope, light-headedness, numbness and headaches.  Hematological: Negative for adenopathy.  Psychiatric/Behavioral: Negative for agitation, confusion, sleep disturbance and suicidal ideas. The patient is not nervous/anxious.    BREAST: No symptoms   Objective: There were no vitals taken for this visit.   Physical Exam Constitutional:      Appearance: She is well-developed.  Genitourinary:     Vulva normal.     No vaginal  discharge, erythema or tenderness.      Right Adnexa: not tender and no mass present.    Left Adnexa: not tender and no mass present.    No cervical polyp.     Uterus is not enlarged or tender.  Breasts:     Right: No mass, nipple discharge, skin change or tenderness.     Left: No mass, nipple discharge, skin change or tenderness.    Neck:     Thyroid: No thyromegaly.  Cardiovascular:     Rate and Rhythm: Normal rate and regular rhythm.     Heart sounds: Normal heart sounds. No murmur heard.   Pulmonary:     Effort: Pulmonary effort is normal.  Breath sounds: Normal breath sounds.  Abdominal:     Palpations: Abdomen is soft.     Tenderness: There is no abdominal tenderness. There is no guarding.  Musculoskeletal:        General: Normal range of motion.     Cervical back: Normal range of motion.  Neurological:     General: No focal deficit present.     Mental Status: [name] and oriented to person, place, and time.     Cranial Nerves: No cranial nerve deficit.  Skin:    General: Skin is warm and dry.  Psychiatric:        Mood and Affect: Mood normal.        Behavior: Behavior normal.        Thought Content: Thought content normal.        Judgment: Judgment normal.  Vitals reviewed.     Assessment/Plan: Encounter for annual routine gynecological examination  Encounter for surveillance of implantable subdermal contraceptive--BTB with nexplanon. F/u in a month or so if daily bleeding continues. Will add POP. Lighter flow now  Screening for STD (sexually transmitted disease) - Plan: HIV Antibody (routine testing w rflx); Check HIV in 4/21.  Encounter for screening mammogram for malignant neoplasm of breast - Plan: MM 3D SCREEN BREAST BILATERAL; pt to sched mammo  Weight loss counseling, encounter for - Plan: phentermine (ADIPEX-P) 37.5 MG tablet; cont wt watchers and exercise. Phentermine for 2 months. F/u prn.   No orders of the defined types were placed in  this encounter.            GYN counsel breast self exam, mammography screening, adequate intake of calcium and vitamin D, diet and exercise     F/U  No follow-ups on file.  Clinton Dragone B. Atiana Levier, PA-C 01/17/2021 3:44 PM

## 2021-01-18 ENCOUNTER — Ambulatory Visit: Payer: Medicaid Other | Admitting: Obstetrics and Gynecology

## 2021-01-18 DIAGNOSIS — Z01419 Encounter for gynecological examination (general) (routine) without abnormal findings: Secondary | ICD-10-CM

## 2021-01-18 DIAGNOSIS — Z1231 Encounter for screening mammogram for malignant neoplasm of breast: Secondary | ICD-10-CM

## 2021-01-18 DIAGNOSIS — Z3046 Encounter for surveillance of implantable subdermal contraceptive: Secondary | ICD-10-CM

## 2021-03-08 ENCOUNTER — Telehealth: Payer: Self-pay

## 2021-03-08 NOTE — Telephone Encounter (Signed)
Please advise pt last seen 3/15  Copied from [place] #[id number]. 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 Wants a cb [phone] as appt offered but does not have ins at the moment and wanted call as just had an appt with Jolene.

## 2021-03-08 NOTE — Telephone Encounter (Signed)
Unable to advise without seeing the patient or seeing the paperwork that needs to be filled out.    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

## 2021-03-09 NOTE — Telephone Encounter (Signed)
Spoke with patient and was informed she has gotten everything taken care of. Patient states the job sent her to a different doctor's office and she received a physical for the paperwork and they paid for the appointment. Patient was advised to give our office a call if she has any concerns or questions.

## 2021-07-13 NOTE — Progress Notes (Signed)
BP 130/87   Pulse 97   Temp 99.1 F (37.3 C) (Oral)   Ht 5' 3.15" (1.604 m)   Wt 214 lb (97.1 kg)   SpO2 100%   BMI 37.73 kg/m    Subjective:    Patient ID: Victoria Shepard, female    DOB: [birth year], 50 y.o.   MRN: [id number]  HPI: Victoria Shepard is a 50 y.o. female presenting on 07/14/2021 for comprehensive medical examination. Current medical complaints include: anxiety  She currently lives with: Menopausal Symptoms: no  HYPERTENSION / HYPERLIPIDEMIA Satisfied with current treatment? no Duration of hypertension: years BP monitoring frequency: not checking BP range:  BP medication side effects: no Past BP meds: lisinopril Duration of hyperlipidemia: years Cholesterol medication side effects: no Cholesterol supplements: none Past cholesterol medications: none Medication compliance:  no previous Aspirin: no Recent stressors: no Recurrent headaches: no Visual changes: no Palpitations: no Dyspnea: no Chest pain: no Lower extremity edema: no Dizzy/lightheaded: no  DEPRESSION/ANXIETY Patient feels like she doesn't have a lot of depression.  States she has been under control for a long time.  States she does have a lot of anxiety.  She does have a stressful job.   Depression Screen done today and results listed below:  Depression screen [facility] 2/9 07/14/2021 12/27/2020 10/05/2020 05/24/2020 12/18/2019  Decreased Interest 0 0 0 0 0  Down, Depressed, Hopeless 0 0 0 0 0  PHQ - 2 Score 0 0 0 0 0  Altered sleeping 0 0 - 0 1  Tired, decreased energy 0 0 - 1 1  Change in appetite 0 0 - 2 0  Feeling bad or failure about yourself  0 0 - 0 0  Trouble concentrating 0 0 - 0 0  Moving slowly or fidgety/restless 0 0 - 0 0  Suicidal thoughts 0 0 - 0 0  PHQ-9 Score 0 0 - 3 2  Difficult doing work/chores - Not difficult at all - - Not difficult at all  Some recent data might be hidden    The patient does not have a history of falls. I did complete a risk assessment for falls. A plan  of care for falls was documented.   Past Medical History:  Past Medical History:  Diagnosis Date   Anxiety    Depression    Heart murmur    Herpes    History of abnormal mammogram 2010   History of cellulitis    belly button   Hypercholesteremia    Hypertension    Nexplanon in place    placed 10/11/14, remove 10/11/17   OCD (obsessive compulsive disorder)     Surgical History:  Past Surgical History:  Procedure Laterality Date   BIOPSY BREAST  04/2010   was normal   BREAST EXCISIONAL BIOPSY Left 2011   benign   BREAST SURGERY Left 2011   COLONOSCOPY  2010   COSMETIC SURGERY  2012   HERNIA REPAIR  2012   LIPOSUCTION  2012   MM DUCTOGRAM BILAT R/S  2010   due to bloody breast discharge    Medications:  Current Outpatient Medications on File Prior to Visit  Medication Sig   albuterol (VENTOLIN HFA) 108 (90 Base) MCG/ACT inhaler Inhale 2 puffs into the lungs every 6 (six) hours as needed for wheezing or shortness of breath.   valACYclovir (VALTREX) 500 MG tablet TAKE 1 TABLET BY MOUTH EVERY DAY   Vitamin D, Ergocalciferol, (DRISDOL) 1.25 MG (50000 UNIT) CAPS capsule TAKE 1 CAPSULE (50,000  UNITS TOTAL) BY MOUTH EVERY 7 (SEVEN) DAYS.   MELATONIN PO Take 1 Dose by mouth daily.   Current Facility-Administered Medications on File Prior to Visit  Medication   etonogestrel (NEXPLANON) implant 68 mg    Allergies:  Allergies  Allergen Reactions   Amoxicillin Anaphylaxis   Penicillins Anaphylaxis   Oseltamivir Hives   Bactrim [Sulfamethoxazole-Trimethoprim] Itching and Swelling   Erythromycin Other (See Comments)    GI Upset   Keflex [Cephalexin] Other (See Comments)    Yeast infection    Social History:  Social History   Socioeconomic History   Marital status: Divorced    Spouse name: Not on file   Number of children: Not on file   Years of education: Not on file   Highest education level: Not on file  Occupational History   Not on file  Tobacco Use    Smoking status: Never   Smokeless tobacco: Never  Vaping Use   Vaping Use: Never used  Substance and Sexual Activity   Alcohol use: No   Drug use: No   Sexual activity: Yes    Birth control/protection: Implant  Other Topics Concern   Not on file  Social History Narrative   Not on file   Social Determinants of Health   Financial Resource Strain: Not on file  Food Insecurity: Not on file  Transportation Needs: Not on file  Physical Activity: Not on file  Stress: Not on file  Social Connections: Not on file  Intimate Partner Violence: Not on file   Social History   Tobacco Use  Smoking Status Never  Smokeless Tobacco Never   Social History   Substance and Sexual Activity  Alcohol Use No    Family History:  Family History  Problem Relation Age of Onset   Hypertension Mother    Hyperlipidemia Mother    Diabetes Mother    Stroke Mother    Heart disease Father        CABG   Hypertension Father    Mental illness Brother        anxiety   Cancer Maternal Grandmother        lung   Stroke Paternal Grandmother    Stroke Paternal Grandfather     Past medical history, surgical history, medications, allergies, family history and social history reviewed with patient today and changes made to appropriate areas of the chart.   Review of Systems  Eyes:  Negative for blurred vision and double vision.  Respiratory:  Negative for shortness of breath.   Cardiovascular:  Negative for chest pain, palpitations and leg swelling.  Neurological:  Negative for dizziness and headaches.  Psychiatric/Behavioral:  Negative for depression and suicidal ideas. The patient is nervous/anxious.   All other ROS negative except what is listed above and in the HPI.      Objective:    BP 130/87   Pulse 97   Temp 99.1 F (37.3 C) (Oral)   Ht 5' 3.15" (1.604 m)   Wt 214 lb (97.1 kg)   SpO2 100%   BMI 37.73 kg/m   Wt Readings from Last 3 Encounters:  07/14/21 214 lb (97.1 kg)  12/27/20  209 lb 12.8 oz (95.2 kg)  05/24/20 212 lb (96.2 kg)    Physical Exam Vitals and nursing note reviewed.  Constitutional:      General: She is awake. She is not in acute distress.    Appearance: She is well-developed. She is obese. She is not ill-appearing.  HENT:  Head: Normocephalic and atraumatic.     Right Ear: Hearing, tympanic membrane, ear canal and external ear normal. No drainage.     Left Ear: Hearing, tympanic membrane, ear canal and external ear normal. No drainage.     Nose: Nose normal.     Right Sinus: No maxillary sinus tenderness or frontal sinus tenderness.     Left Sinus: No maxillary sinus tenderness or frontal sinus tenderness.     Mouth/Throat:     Mouth: Mucous membranes are moist.     Pharynx: Oropharynx is clear. Uvula midline. No pharyngeal swelling, oropharyngeal exudate or posterior oropharyngeal erythema.  Eyes:     General: Lids are normal.        Right eye: No discharge.        Left eye: No discharge.     Extraocular Movements: Extraocular movements intact.     Conjunctiva/sclera: Conjunctivae normal.     Pupils: Pupils are equal, round, and reactive to light.     Visual Fields: Right eye visual fields normal and left eye visual fields normal.  Neck:     Thyroid: No thyromegaly.     Vascular: No carotid bruit.     Trachea: Trachea normal.  Cardiovascular:     Rate and Rhythm: Normal rate and regular rhythm.     Heart sounds: Normal heart sounds. No murmur heard.   No gallop.  Pulmonary:     Effort: Pulmonary effort is normal. No accessory muscle usage or respiratory distress.     Breath sounds: Normal breath sounds.  Chest:  Breasts:    Right: Normal.     Left: Normal.    Abdominal:     General: Bowel sounds are normal.     Palpations: Abdomen is soft. There is no hepatomegaly or splenomegaly.     Tenderness: There is no abdominal tenderness.  Musculoskeletal:        General: Normal range of motion.     Cervical back: Normal range of  motion and neck supple.     Right lower leg: No edema.     Left lower leg: No edema.  Lymphadenopathy:     Head:     Right side of head: No submental, submandibular, tonsillar, preauricular or posterior auricular adenopathy.     Left side of head: No submental, submandibular, tonsillar, preauricular or posterior auricular adenopathy.     Cervical: No cervical adenopathy.     Upper Body:     Right upper body: No supraclavicular, axillary or pectoral adenopathy.     Left upper body: No supraclavicular, axillary or pectoral adenopathy.  Skin:    General: Skin is warm and dry.     Capillary Refill: Capillary refill takes less than 2 seconds.     Findings: No rash.  Neurological:     Mental Status: [name] and oriented to person, place, and time.     Cranial Nerves: Cranial nerves are intact.     Gait: Gait is intact.     Deep Tendon Reflexes: Reflexes are normal and symmetric.     Reflex Scores:      Brachioradialis reflexes are 2+ on the right side and 2+ on the left side.      Patellar reflexes are 2+ on the right side and 2+ on the left side. Psychiatric:        Attention and Perception: Attention normal.        Mood and Affect: Mood normal.        Speech:  Speech normal.        Behavior: Behavior normal. Behavior is cooperative.        Thought Content: Thought content normal.        Judgment: Judgment normal.    Results for orders placed or performed in visit on 12/27/20  CBC with Differential/Platelet  Result Value Ref Range   WBC 7.0 3.4 - 10.8 x10E3/uL   RBC 5.29 (H) 3.77 - 5.28 x10E6/uL   Hemoglobin 15.7 11.1 - 15.9 g/dL   Hematocrit 48.3 (H) 34.0 - 46.6 %   MCV 91 79 - 97 fL   MCH 29.7 26.6 - 33.0 pg   MCHC 32.5 31.5 - 35.7 g/dL   RDW 12.9 11.7 - 15.4 %   Platelets 338 150 - 450 x10E3/uL   Neutrophils 60 Not Estab. %   Lymphs 30 Not Estab. %   Monocytes 7 Not Estab. %   Eos 2 Not Estab. %   Basos 1 Not Estab. %   Neutrophils Absolute 4.2 1.4 - 7.0 x10E3/uL    Lymphocytes Absolute 2.1 0.7 - 3.1 x10E3/uL   Monocytes Absolute 0.5 0.1 - 0.9 x10E3/uL   EOS (ABSOLUTE) 0.1 0.0 - 0.4 x10E3/uL   Basophils Absolute 0.1 0.0 - 0.2 x10E3/uL   Immature Granulocytes 0 Not Estab. %   Immature Grans (Abs) 0.0 0.0 - 0.1 x10E3/uL  Lipid Panel w/o Chol/HDL Ratio  Result Value Ref Range   Cholesterol, Total 236 (H) 100 - 199 mg/dL   Triglycerides 171 (H) 0 - 149 mg/dL   HDL 42 >39 mg/dL   VLDL Cholesterol Cal 31 5 - 40 mg/dL   LDL Chol Calc (NIH) 163 (H) 0 - 99 mg/dL  Comprehensive metabolic panel  Result Value Ref Range   Glucose 112 (H) 65 - 99 mg/dL   BUN 8 6 - 24 mg/dL   Creatinine, Ser 0.96 0.57 - 1.00 mg/dL   eGFR 73 >59 mL/min/1.73   BUN/Creatinine Ratio 8 (L) 9 - 23   Sodium 138 134 - 144 mmol/L   Potassium 4.3 3.5 - 5.2 mmol/L   Chloride 98 96 - 106 mmol/L   CO2 21 20 - 29 mmol/L   Calcium 9.7 8.7 - 10.2 mg/dL   Total Protein 7.7 6.0 - 8.5 g/dL   Albumin 5.0 (H) 3.8 - 4.8 g/dL   Globulin, Total 2.7 1.5 - 4.5 g/dL   Albumin/Globulin Ratio 1.9 1.2 - 2.2   Bilirubin Total 1.3 (H) 0.0 - 1.2 mg/dL   Alkaline Phosphatase 78 44 - 121 IU/L   AST 21 0 - 40 IU/L   ALT 22 0 - 32 IU/L  TSH  Result Value Ref Range   TSH 3.120 0.450 - 4.500 uIU/mL  HIV Antibody (routine testing w rflx)  Result Value Ref Range   HIV Screen 4th Generation wRfx Non Reactive Non Reactive      Assessment & Plan:   Problem List Items Addressed This Visit       Cardiovascular and Mediastinum   Hypertension    Chronic.  Not well controlled.  Will start Lisinopril $RemoveBeforeDE'5mg'DsKnRhkHsretiUq$  daily.  Side effects and benefits of medication discussed with patient during visit today. Follow up in 1 month for reevaluation. Recommend checking blood pressures at home and bringing log to next visit.        Relevant Medications   lisinopril (ZESTRIL) 5 MG tablet   rosuvastatin (CRESTOR) 20 MG tablet     Other   Anxiety    Chronic.  Not well  controlled.  Will restart Celexa 40m daily.  If  tolerating well after 2 weeks can increase to 219mdaily.  Follow up in 1 month for reevaluation.  Labs ordered today.  Return to clinic in 1 month for reevaluation.  Call sooner if concerns arise.        Relevant Medications   citalopram (CELEXA) 10 MG tablet   Depression    Chronic.  Controlled without medication.  Labs ordered today.  Return to clinic in 6 months for reevaluation.  Call sooner if concerns arise.        Relevant Medications   citalopram (CELEXA) 10 MG tablet   High cholesterol    Chronic. Will start Crestor 2056maily.  Labs ordered today.  Will make recommendations based on lab results.       Relevant Medications   lisinopril (ZESTRIL) 5 MG tablet   rosuvastatin (CRESTOR) 20 MG tablet   Morbid obesity ([place]  Recommend a healthy lifestyle through diet and exercise.       Other Visit Diagnoses     Annual physical exam    -  Primary   Health Maintenance reviewed during visit today. Labs ordered.  Mammogram ordered.    Relevant Orders   CBC with Differential/Platelet   Comprehensive metabolic panel   Lipid panel   TSH   Urinalysis, Routine w reflex microscopic   Prediabetes       Relevant Orders   HgB A1c   Need for influenza vaccination       Relevant Orders   Flu Vaccine QUAD 70mo68mo(Fluarix, Fluzone & Alfiuria Quad PF) (Completed)   Screening for HIV (human immunodeficiency virus)       Relevant Medications   clindamycin (CLEOCIN) 150 MG capsule   Other Relevant Orders   HIV Antibody (routine testing w rflx)   Encounter for screening mammogram for malignant neoplasm of breast       Relevant Orders   MM Digital Screening   Vitamin D deficiency       Relevant Orders   Vitamin D (25 hydroxy)   Hydradenitis       Will treat with Clindamycin due to allergies to other antibiotics. Will refer to derm due to recurrent hydradenitis.    Relevant Orders   Ambulatory referral to Dermatology        Follow up plan: Return in about 1 month (around  08/13/2021) for Depression/Anxiety FU, BP Check.   LABORATORY TESTING:  - Pap smear: done elsewhere  IMMUNIZATIONS:   - Tdap: Tetanus vaccination status reviewed: last tetanus booster within 10 years. - Influenza: Up to date - Pneumovax: Not applicable - Prevnar: Not applicable - HPV: Not applicable - Zostavax vaccine: Not applicable  SCREENING: -Mammogram: Ordered today  - Colonoscopy:  Will wait till next year   - Bone Density: Not applicable  -Hearing Test: Not applicable  -Spirometry: Not applicable   PATIENT COUNSELING:   Advised to take 1 mg of folate supplement per day if capable of pregnancy.   Sexuality: Discussed sexually transmitted diseases, partner selection, use of condoms, avoidance of unintended pregnancy  and contraceptive alternatives.   Advised to avoid cigarette smoking.  I discussed with the patient that most people either abstain from alcohol or drink within safe limits (<=14/week and <=4 drinks/occasion for males, <=7/weeks and <= 3 drinks/occasion for females) and that the risk for alcohol disorders and other health effects rises proportionally with the number of drinks per week and how often a drinker exceeds  daily limits.  Discussed cessation/primary prevention of drug use and availability of treatment for abuse.   Diet: Encouraged to adjust caloric intake to maintain  or achieve ideal body weight, to reduce intake of dietary saturated fat and total fat, to limit sodium intake by avoiding high sodium foods and not adding table salt, and to maintain adequate dietary potassium and calcium preferably from fresh fruits, vegetables, and low-fat dairy products.    stressed the importance of regular exercise  Injury prevention: Discussed safety belts, safety helmets, smoke detector, smoking near bedding or upholstery.   Dental health: Discussed importance of regular tooth brushing, flossing, and dental visits.    NEXT PREVENTATIVE PHYSICAL DUE IN 1  YEAR. Return in about 1 month (around 08/13/2021) for Depression/Anxiety FU, BP Check.

## 2021-07-14 ENCOUNTER — Ambulatory Visit (INDEPENDENT_AMBULATORY_CARE_PROVIDER_SITE_OTHER): Payer: Managed Care, Other (non HMO) | Admitting: Nurse Practitioner

## 2021-07-14 ENCOUNTER — Encounter: Payer: Self-pay | Admitting: Nurse Practitioner

## 2021-07-14 ENCOUNTER — Other Ambulatory Visit: Payer: Self-pay

## 2021-07-14 VITALS — BP 130/87 | HR 97 | Temp 99.1°F | Ht 63.15 in | Wt 214.0 lb

## 2021-07-14 DIAGNOSIS — R7303 Prediabetes: Secondary | ICD-10-CM | POA: Diagnosis not present

## 2021-07-14 DIAGNOSIS — Z1231 Encounter for screening mammogram for malignant neoplasm of breast: Secondary | ICD-10-CM

## 2021-07-14 DIAGNOSIS — I10 Essential (primary) hypertension: Secondary | ICD-10-CM | POA: Diagnosis not present

## 2021-07-14 DIAGNOSIS — Z23 Encounter for immunization: Secondary | ICD-10-CM | POA: Diagnosis not present

## 2021-07-14 DIAGNOSIS — F419 Anxiety disorder, unspecified: Secondary | ICD-10-CM

## 2021-07-14 DIAGNOSIS — F3342 Major depressive disorder, recurrent, in full remission: Secondary | ICD-10-CM

## 2021-07-14 DIAGNOSIS — L732 Hidradenitis suppurativa: Secondary | ICD-10-CM

## 2021-07-14 DIAGNOSIS — Z114 Encounter for screening for human immunodeficiency virus [HIV]: Secondary | ICD-10-CM

## 2021-07-14 DIAGNOSIS — E559 Vitamin D deficiency, unspecified: Secondary | ICD-10-CM

## 2021-07-14 DIAGNOSIS — Z0000 Encounter for general adult medical examination without abnormal findings: Secondary | ICD-10-CM

## 2021-07-14 DIAGNOSIS — E7800 Pure hypercholesterolemia, unspecified: Secondary | ICD-10-CM

## 2021-07-14 LAB — URINALYSIS, ROUTINE W REFLEX MICROSCOPIC
Bilirubin, UA: NEGATIVE
Glucose, UA: NEGATIVE
Ketones, UA: NEGATIVE
Leukocytes,UA: NEGATIVE
Nitrite, UA: NEGATIVE
Protein,UA: NEGATIVE
RBC, UA: NEGATIVE
Specific Gravity, UA: 1.025 (ref 1.005–1.030)
Urobilinogen, Ur: 0.2 mg/dL (ref 0.2–1.0)
pH, UA: 6 (ref 5.0–7.5)

## 2021-07-14 MED ORDER — LEVOCETIRIZINE DIHYDROCHLORIDE 5 MG PO TABS: 5.0000 mg | ORAL_TABLET | Freq: Every evening | ORAL | 4 refills | Status: DC

## 2021-07-14 MED ORDER — CLINDAMYCIN HCL 150 MG PO CAPS: 150.0000 mg | ORAL_CAPSULE | Freq: Three times a day (TID) | ORAL | 0 refills | Status: DC

## 2021-07-14 MED ORDER — CITALOPRAM HYDROBROMIDE 10 MG PO TABS: 10.0000 mg | ORAL_TABLET | Freq: Every day | ORAL | 0 refills | Status: DC

## 2021-07-14 MED ORDER — LINACLOTIDE 145 MCG PO CAPS: 145.0000 ug | ORAL_CAPSULE | Freq: Every day | ORAL | 1 refills | Status: DC

## 2021-07-14 MED ORDER — LISINOPRIL 5 MG PO TABS: 5.0000 mg | ORAL_TABLET | Freq: Every day | ORAL | 1 refills | Status: DC

## 2021-07-14 MED ORDER — PANTOPRAZOLE SODIUM 40 MG PO TBEC: 40.0000 mg | DELAYED_RELEASE_TABLET | Freq: Every day | ORAL | 1 refills | Status: DC

## 2021-07-14 MED ORDER — FLUTICASONE PROPIONATE 50 MCG/ACT NA SUSP: 2.0000 | Freq: Every day | NASAL | 6 refills | Status: DC

## 2021-07-14 MED ORDER — ROSUVASTATIN CALCIUM 20 MG PO TABS: 20.0000 mg | ORAL_TABLET | Freq: Every day | ORAL | 1 refills | Status: DC

## 2021-07-14 NOTE — Assessment & Plan Note (Signed)
Recommend a healthy lifestyle through diet and exercise.  °

## 2021-07-14 NOTE — Assessment & Plan Note (Signed)
Chronic.  Not well controlled.  Will restart Celexa 10mg  daily.  If tolerating well after 2 weeks can increase to 20mg  daily.  Follow up in 1 month for reevaluation.  Labs ordered today.  Return to clinic in 1 month for reevaluation.  Call sooner if concerns arise.

## 2021-07-14 NOTE — Assessment & Plan Note (Signed)
Chronic.  Controlled without medication..  Labs ordered today.  Return to clinic in 6 months for reevaluation.  Call sooner if concerns arise.  ° °

## 2021-07-14 NOTE — Progress Notes (Signed)
Hi Talea. It was nice to meet you today. Your urine from today looks good.  I will send you another message once the rest of your lab work comes back.

## 2021-07-14 NOTE — Assessment & Plan Note (Signed)
Chronic. Will start Crestor 20mg  daily.  Labs ordered today.  Will make recommendations based on lab results.

## 2021-07-14 NOTE — Assessment & Plan Note (Signed)
Chronic.  Not well controlled.  Will start Lisinopril 5mg  daily.  Side effects and benefits of medication discussed with patient during visit today. Follow up in 1 month for reevaluation. Recommend checking blood pressures at home and bringing log to next visit.

## 2021-07-15 LAB — LIPID PANEL
Chol/HDL Ratio: 4.5 ratio — ABNORMAL HIGH (ref 0.0–4.4)
Cholesterol, Total: 202 mg/dL — ABNORMAL HIGH (ref 100–199)
HDL: 45 mg/dL (ref >39)
LDL Chol Calc (NIH): 140 mg/dL — ABNORMAL HIGH (ref 0–99)
Triglycerides: 95 mg/dL (ref 0–149)
VLDL Cholesterol Cal: 17 mg/dL (ref 5–40)

## 2021-07-15 LAB — CBC WITH DIFFERENTIAL/PLATELET
Basophils Absolute: 0.1 10*3/uL (ref 0.0–0.2)
Basos: 1 %
EOS (ABSOLUTE): 0.2 10*3/uL (ref 0.0–0.4)
Eos: 2 %
Hematocrit: 45.1 % (ref 34.0–46.6)
Hemoglobin: 14.6 g/dL (ref 11.1–15.9)
Immature Grans (Abs): 0 10*3/uL (ref 0.0–0.1)
Immature Granulocytes: 0 %
Lymphocytes Absolute: 1.9 10*3/uL (ref 0.7–3.1)
Lymphs: 27 %
MCH: 28.6 pg (ref 26.6–33.0)
MCHC: 32.4 g/dL (ref 31.5–35.7)
MCV: 88 fL (ref 79–97)
Monocytes Absolute: 0.6 10*3/uL (ref 0.1–0.9)
Monocytes: 9 %
Neutrophils Absolute: 4.2 10*3/uL (ref 1.4–7.0)
Neutrophils: 61 %
Platelets: 353 10*3/uL (ref 150–450)
RBC: 5.1 x10E6/uL (ref 3.77–5.28)
RDW: 12.5 % (ref 11.7–15.4)
WBC: 6.9 10*3/uL (ref 3.4–10.8)

## 2021-07-15 LAB — COMPREHENSIVE METABOLIC PANEL
ALT: 19 IU/L (ref 0–32)
AST: 18 IU/L (ref 0–40)
Albumin/Globulin Ratio: 1.4 (ref 1.2–2.2)
Albumin: 4.3 g/dL (ref 3.8–4.8)
Alkaline Phosphatase: 78 IU/L (ref 44–121)
BUN/Creatinine Ratio: 13 (ref 9–23)
BUN: 11 mg/dL (ref 6–24)
Bilirubin Total: 0.6 mg/dL (ref 0.0–1.2)
CO2: 20 mmol/L (ref 20–29)
Calcium: 9.2 mg/dL (ref 8.7–10.2)
Chloride: 100 mmol/L (ref 96–106)
Creatinine, Ser: 0.88 mg/dL (ref 0.57–1.00)
Globulin, Total: 3 g/dL (ref 1.5–4.5)
Glucose: 106 mg/dL — ABNORMAL HIGH (ref 70–99)
Potassium: 4.5 mmol/L (ref 3.5–5.2)
Sodium: 138 mmol/L (ref 134–144)
Total Protein: 7.3 g/dL (ref 6.0–8.5)
eGFR: 81 mL/min/{1.73_m2} (ref >59)

## 2021-07-15 LAB — HIV ANTIBODY (ROUTINE TESTING W REFLEX): HIV Screen 4th Generation wRfx: NONREACTIVE

## 2021-07-15 LAB — HEMOGLOBIN A1C
Est. average glucose Bld gHb Est-mCnc: 120 mg/dL
Hgb A1c MFr Bld: 5.8 % — ABNORMAL HIGH (ref 4.8–5.6)

## 2021-07-15 LAB — TSH: TSH: 2.2 u[IU]/mL (ref 0.450–4.500)

## 2021-07-15 LAB — VITAMIN D 25 HYDROXY (VIT D DEFICIENCY, FRACTURES): Vit D, 25-Hydroxy: 25.3 ng/mL — ABNORMAL LOW (ref 30.0–100.0)

## 2021-07-17 MED ORDER — VITAMIN D (ERGOCALCIFEROL) 1.25 MG (50000 UNIT) PO CAPS: 50000.0000 [IU] | ORAL_CAPSULE | ORAL | 1 refills | Status: DC

## 2021-07-17 NOTE — Addendum Note (Signed)
Addended by: Jon Billings on: 07/17/2021 08:38 AM   Modules accepted: Orders

## 2021-07-17 NOTE — Progress Notes (Signed)
Please let patient know that her lab work shows her cholesterol is elevated.  But Triglycerides are normal.  Likely due to her diet changes.  Continue with the crestor 20mg .  This will help improve her cholesterol.  Patient is also prediabetic.  Her A1c is 5.8.  We need to keep her here and not progress to diabetes.  Recommend a low carb diet and exercise. Vitamin D is low.  Recommend she continue her Vitamin D supplement.  Please let me know if she has any questions.  I will see her at our next visit.

## 2021-07-17 NOTE — Progress Notes (Signed)
I sent an updated prescription to the pharmacy for the Vitamin D.  I do not think it is necessary to check sugars at this time unless patient really wants to.

## 2021-08-10 ENCOUNTER — Other Ambulatory Visit: Payer: Self-pay | Admitting: Nurse Practitioner

## 2021-08-10 NOTE — Telephone Encounter (Signed)
Requested Prescriptions  Pending Prescriptions Disp Refills  . citalopram (CELEXA) 10 MG tablet [Pharmacy Med Name: CITALOPRAM HBR 10 MG TABLET] 30 tablet 0    Sig: TAKE 1 TABLET BY MOUTH EVERY DAY     Psychiatry:  Antidepressants - SSRI Passed - 08/10/2021  1:23 AM      Passed - Completed PHQ-2 or PHQ-9 in the last 360 days      Passed - Valid encounter within last 6 months    Recent Outpatient Visits          3 weeks ago Annual physical exam   [facility] Jon Billings, NP   7 months ago Recurrent major depressive disorder, in full remission ([place])   [place], [place] T, NP   10 months ago Fever, unspecified fever cause   [facility] [place], Barbaraann Faster, NP   1 year ago Anxiety   [facility] Volney American, [place]   1 year ago Fatigue, unspecified type   [facility], Lilia Argue, [place]      Future Appointments            In 6 days Jon Billings, NP [facility], [place]

## 2021-08-15 NOTE — Progress Notes (Signed)
BP 104/71   Pulse 81   Temp 98.1 F (36.7 C)   Wt 214 lb (97.1 kg)   SpO2 98%   BMI 37.73 kg/m    Subjective:    Patient ID: Victoria Shepard, female    DOB: 11/14/1970, 50 y.o.   MRN: [id number]  HPI: [name] is a 50 y.o. female  Chief Complaint  Patient presents with   Depression   Anxiety   Fatigue    Patient states in the past week she noticed she has been very tired    Hypertension   ANXIETY/DEPRESSION Patient states she is doing well.  She has increased her dose to $Remov'30mg'XXqpWg$  because she has been tolerating it well.  She does feel like she is ready to go back to $Remov'40mg'UzTaSU$ .  Denies SI.   [place] Office Visit from 08/16/2021 in [place]  PHQ-9 Total Score 0      GAD 7 : Generalized Anxiety Score 08/16/2021 12/27/2020 05/24/2020 12/18/2019  Nervous, Anxious, on Edge 0 1 0 0  Control/stop worrying 0 0 0 0  Worry too much - different things 0 0 0 0  Trouble relaxing 0 0 0 0  Restless 0 0 0 0  Easily annoyed or irritable 0 0 0 1  Afraid - awful might happen 0 0 0 0  Total GAD 7 Score 0 1 0 1  Anxiety Difficulty - - - Not difficult at all   SINUS INFECTION Patient states she has been sick for over a week.  She has a headache, achy, had a fever and very fatigued.  She has not been able go to work.  Has a lot of sinus pressure.   HYPERTENSION Hypertension status: controlled  Satisfied with current treatment? yes Duration of hypertension: years BP monitoring frequency:  not checking BP range:  BP medication side effects:  no Medication compliance: excellent compliance Previous BP meds:lisinopril Aspirin: no Recurrent headaches: no Visual changes: no Palpitations: no Dyspnea: no Chest pain: no Lower extremity edema: no Dizzy/lightheaded: no   Relevant past medical, surgical, family and social history reviewed and updated as indicated. Interim medical history since our last visit reviewed. Allergies and medications reviewed and  updated.  Review of Systems  Eyes:  Negative for visual disturbance.  Respiratory:  Negative for cough, chest tightness and shortness of breath.   Cardiovascular:  Negative for chest pain, palpitations and leg swelling.  Neurological:  Negative for dizziness and headaches.   Per HPI unless specifically indicated above     Objective:    BP 104/71   Pulse 81   Temp 98.1 F (36.7 C)   Wt 214 lb (97.1 kg)   SpO2 98%   BMI 37.73 kg/m   Wt Readings from Last 3 Encounters:  08/16/21 214 lb (97.1 kg)  07/14/21 214 lb (97.1 kg)  12/27/20 209 lb 12.8 oz (95.2 kg)    Physical Exam Vitals and nursing note reviewed.  Constitutional:      General: She is not in acute distress.    Appearance: Normal appearance. She is normal weight. She is not ill-appearing, toxic-appearing or diaphoretic.  HENT:     Head: Normocephalic.     Right Ear: Tympanic membrane and external ear normal.     Left Ear: Tympanic membrane and external ear normal.     Nose: Congestion and rhinorrhea present.     Mouth/Throat:     Mouth: Mucous membranes are moist.     Pharynx: Oropharynx is  clear. Posterior oropharyngeal erythema present. No oropharyngeal exudate.  Eyes:     General:        Right eye: No discharge.        Left eye: No discharge.     Extraocular Movements: Extraocular movements intact.     Conjunctiva/sclera: Conjunctivae normal.     Pupils: Pupils are equal, round, and reactive to light.  Cardiovascular:     Rate and Rhythm: Normal rate and regular rhythm.     Heart sounds: No murmur heard. Pulmonary:     Effort: Pulmonary effort is normal. No respiratory distress.     Breath sounds: Normal breath sounds. No wheezing or rales.  Musculoskeletal:     Cervical back: Normal range of motion and neck supple.  Skin:    General: Skin is warm and dry.     Capillary Refill: Capillary refill takes less than 2 seconds.  Neurological:     General: No focal deficit present.     Mental Status: She is  alert and oriented to person, place, and time. Mental status is at baseline.  Psychiatric:        Mood and Affect: Mood normal.        Behavior: Behavior normal.        Thought Content: Thought content normal.        Judgment: Judgment normal.    Results for orders placed or performed in visit on 07/14/21  CBC with Differential/Platelet  Result Value Ref Range   WBC 6.9 3.4 - 10.8 x10E3/uL   RBC 5.10 3.77 - 5.28 x10E6/uL   Hemoglobin 14.6 11.1 - 15.9 g/dL   Hematocrit 45.1 34.0 - 46.6 %   MCV 88 79 - 97 fL   MCH 28.6 26.6 - 33.0 pg   MCHC 32.4 31.5 - 35.7 g/dL   RDW 12.5 11.7 - 15.4 %   Platelets 353 150 - 450 x10E3/uL   Neutrophils 61 Not Estab. %   Lymphs 27 Not Estab. %   Monocytes 9 Not Estab. %   Eos 2 Not Estab. %   Basos 1 Not Estab. %   Neutrophils Absolute 4.2 1.4 - 7.0 x10E3/uL   Lymphocytes Absolute 1.9 0.7 - 3.1 x10E3/uL   Monocytes Absolute 0.6 0.1 - 0.9 x10E3/uL   EOS (ABSOLUTE) 0.2 0.0 - 0.4 x10E3/uL   Basophils Absolute 0.1 0.0 - 0.2 x10E3/uL   Immature Granulocytes 0 Not Estab. %   Immature Grans (Abs) 0.0 0.0 - 0.1 x10E3/uL  Comprehensive metabolic panel  Result Value Ref Range   Glucose 106 (H) 70 - 99 mg/dL   BUN 11 6 - 24 mg/dL   Creatinine, Ser 0.88 0.57 - 1.00 mg/dL   eGFR 81 >59 mL/min/1.73   BUN/Creatinine Ratio 13 9 - 23   Sodium 138 134 - 144 mmol/L   Potassium 4.5 3.5 - 5.2 mmol/L   Chloride 100 96 - 106 mmol/L   CO2 20 20 - 29 mmol/L   Calcium 9.2 8.7 - 10.2 mg/dL   Total Protein 7.3 6.0 - 8.5 g/dL   Albumin 4.3 3.8 - 4.8 g/dL   Globulin, Total 3.0 1.5 - 4.5 g/dL   Albumin/Globulin Ratio 1.4 1.2 - 2.2   Bilirubin Total 0.6 0.0 - 1.2 mg/dL   Alkaline Phosphatase 78 44 - 121 IU/L   AST 18 0 - 40 IU/L   ALT 19 0 - 32 IU/L  Lipid panel  Result Value Ref Range   Cholesterol, Total 202 (H) 100 - 199 mg/dL  Triglycerides 95 0 - 149 mg/dL   HDL 45 >39 mg/dL   VLDL Cholesterol Cal 17 5 - 40 mg/dL   LDL Chol Calc (NIH) 140 (H) 0 - 99 mg/dL    Chol/HDL Ratio 4.5 (H) 0.0 - 4.4 ratio  TSH  Result Value Ref Range   TSH 2.200 0.450 - 4.500 uIU/mL  Urinalysis, Routine w reflex microscopic  Result Value Ref Range   Specific Gravity, UA 1.025 1.005 - 1.030   pH, UA 6.0 5.0 - 7.5   Color, UA Yellow Yellow   Appearance Ur Cloudy (A) Clear   Leukocytes,UA Negative Negative   Protein,UA Negative Negative/Trace   Glucose, UA Negative Negative   Ketones, UA Negative Negative   RBC, UA Negative Negative   Bilirubin, UA Negative Negative   Urobilinogen, Ur 0.2 0.2 - 1.0 mg/dL   Nitrite, UA Negative Negative  HgB A1c  Result Value Ref Range   Hgb A1c MFr Bld 5.8 (H) 4.8 - 5.6 %   Est. average glucose Bld gHb Est-mCnc 120 mg/dL  HIV Antibody (routine testing w rflx)  Result Value Ref Range   HIV Screen 4th Generation wRfx Non Reactive Non Reactive  Vitamin D (25 hydroxy)  Result Value Ref Range   Vit D, 25-Hydroxy 25.3 (L) 30.0 - 100.0 ng/mL      Assessment & Plan:   Problem List Items Addressed This Visit       Cardiovascular and Mediastinum   Hypertension    Chronic.  Controlled.  Continue with current medication regimen of Lisinopril $RemoveBefor'5mg'qmAfnYWnvNFH$  daily. Return to clinic in 3 months for reevaluation.  Call sooner if concerns arise.          Other   Anxiety    Chronic.  Controlled.  Patient would like to increase to Celexa $RemoveB'40mg'yWcqcZxF$  daily.  She has already been taking $RemoveBef'30mg'GsfjeqmHQp$  daily and doing well.  PHQ9 and GAD7 are 0 at visit today.  Medication sent to the pharmacy for her.  Return to clinic in 3 months for reevaluation.  Call sooner if concerns arise.        Relevant Medications   citalopram (CELEXA) 40 MG tablet   Depression - Primary    Chronic.  Controlled.  Patient would like to increase to Celexa $RemoveB'40mg'uJRBVHdv$  daily.  She has already been taking $RemoveBef'30mg'vCQjqCjgho$  daily and doing well.  PHQ9 and GAD7 are 0 at visit today.  Medication sent to the pharmacy for her.  Return to clinic in 3 months for reevaluation.  Call sooner if concerns arise.        Relevant Medications   citalopram (CELEXA) 40 MG tablet   Other Visit Diagnoses     Acute maxillary sinusitis, recurrence not specified       Patient states azithromycin works for her. Complete course of antibiotics. Letter written for patient. Rest and stay hydrates. FU if symptoms do not improve.   Relevant Medications   azithromycin (ZITHROMAX) 250 MG tablet        Follow up plan: Return in about 3 months (around 11/16/2021) for Depression/Anxiety FU, HTN and PAP.

## 2021-08-16 ENCOUNTER — Ambulatory Visit (INDEPENDENT_AMBULATORY_CARE_PROVIDER_SITE_OTHER): Payer: Managed Care, Other (non HMO) | Admitting: Nurse Practitioner

## 2021-08-16 ENCOUNTER — Other Ambulatory Visit: Payer: Self-pay

## 2021-08-16 ENCOUNTER — Encounter: Payer: Self-pay | Admitting: Nurse Practitioner

## 2021-08-16 VITALS — BP 104/71 | HR 81 | Temp 98.1°F | Wt 214.0 lb

## 2021-08-16 DIAGNOSIS — F3342 Major depressive disorder, recurrent, in full remission: Secondary | ICD-10-CM | POA: Diagnosis not present

## 2021-08-16 DIAGNOSIS — F419 Anxiety disorder, unspecified: Secondary | ICD-10-CM

## 2021-08-16 DIAGNOSIS — I10 Essential (primary) hypertension: Secondary | ICD-10-CM | POA: Diagnosis not present

## 2021-08-16 DIAGNOSIS — J0100 Acute maxillary sinusitis, unspecified: Secondary | ICD-10-CM | POA: Diagnosis not present

## 2021-08-16 MED ORDER — AZITHROMYCIN 250 MG PO TABS: ORAL_TABLET | ORAL | 0 refills | Status: AC

## 2021-08-16 MED ORDER — CITALOPRAM HYDROBROMIDE 40 MG PO TABS: 40.0000 mg | ORAL_TABLET | Freq: Every day | ORAL | 1 refills | Status: DC

## 2021-08-16 MED ORDER — PANTOPRAZOLE SODIUM 40 MG PO TBEC: 40.0000 mg | DELAYED_RELEASE_TABLET | Freq: Every day | ORAL | 1 refills | Status: DC

## 2021-08-16 NOTE — Assessment & Plan Note (Signed)
Chronic.  Controlled.  Patient would like to increase to Celexa 40mg  daily.  She has already been taking 30mg  daily and doing well.  PHQ9 and GAD7 are 0 at visit today.  Medication sent to the pharmacy for her.  Return to clinic in 3 months for reevaluation.  Call sooner if concerns arise.

## 2021-08-16 NOTE — Assessment & Plan Note (Addendum)
Chronic.  Controlled.  Patient would like to increase to Celexa 40mg  daily.  She has already been taking 30mg  daily and doing well.  PHQ9 and GAD7 are 0 at visit today.  Medication sent to the pharmacy for her.  Return to clinic in 3 months for reevaluation.  Call sooner if concerns arise.

## 2021-08-16 NOTE — Assessment & Plan Note (Signed)
Chronic.  Controlled.  Continue with current medication regimen of Lisinopril 5mg daily. Return to clinic in 3 months for reevaluation.  Call sooner if concerns arise.   

## 2021-09-08 ENCOUNTER — Other Ambulatory Visit: Payer: Self-pay | Admitting: Nurse Practitioner

## 2021-09-09 NOTE — Telephone Encounter (Signed)
Medication dose 10 mg discontinued 08/16/21. Dose changed to 40 mg  Requested Prescriptions  Refused Prescriptions Disp Refills  . citalopram (CELEXA) 10 MG tablet [Pharmacy Med Name: CITALOPRAM HBR 10 MG TABLET] 30 tablet 0    Sig: TAKE 1 TABLET BY MOUTH EVERY DAY     Psychiatry:  Antidepressants - SSRI Passed - 09/08/2021  1:22 AM      Passed - Completed PHQ-2 or PHQ-9 in the last 360 days      Passed - Valid encounter within last 6 months    Recent Outpatient Visits          3 weeks ago Recurrent major depressive disorder, in full remission ([place])   [place], Karen, NP   1 month ago Annual physical exam   [facility] Jon Billings, NP   8 months ago Recurrent major depressive disorder, in full remission ([place])   [place], [place] T, NP   11 months ago Fever, unspecified fever cause   [facility] [place], Barbaraann Faster, NP   1 year ago Anxiety   [facility] Volney American, [place]      Future Appointments            In 2 months Jon Billings, NP [facility], [place]

## 2021-11-21 DIAGNOSIS — R7303 Prediabetes: Secondary | ICD-10-CM | POA: Insufficient documentation

## 2021-11-21 NOTE — Progress Notes (Deleted)
There were no vitals taken for this visit.   Subjective:    Patient ID: Victoria Shepard, female    DOB: [birth year], 51 y.o.   MRN: [id number]  HPI: Victoria Shepard is a 51 y.o. female  No chief complaint on file.  HYPERTENSION / HYPERLIPIDEMIA Satisfied with current treatment? {Blank single:19197::"yes","no"} Duration of hypertension: {Blank single:19197::"chronic","months","years"} BP monitoring frequency: {Blank single:19197::"not checking","rarely","daily","weekly","monthly","a few times a day","a few times a week","a few times a month"} BP range:  BP medication side effects: {Blank single:19197::"yes","no"} Past BP meds: {Blank multiple:19196::"none","amlodipine","amlodipine/benazepril","atenolol","benazepril","benazepril/HCTZ","bisoprolol (bystolic)","carvedilol","chlorthalidone","clonidine","diltiazem","exforge HCT","HCTZ","irbesartan (avapro)","labetalol","lisinopril","lisinopril-HCTZ","losartan (cozaar)","methyldopa","nifedipine","olmesartan (benicar)","olmesartan-HCTZ","quinapril","ramipril","spironalactone","tekturna","valsartan","valsartan-HCTZ","verapamil"} Duration of hyperlipidemia: {Blank single:19197::"chronic","months","years"} Cholesterol medication side effects: {Blank single:19197::"yes","no"} Cholesterol supplements: {Blank multiple:19196::"none","fish oil","niacin","red yeast rice"} Past cholesterol medications: {Blank multiple:19196::"none","atorvastain (lipitor)","lovastatin (mevacor)","pravastatin (pravachol)","rosuvastatin (crestor)","simvastatin (zocor)","vytorin","fenofibrate (tricor)","gemfibrozil","ezetimide (zetia)","niaspan","lovaza"} Medication compliance: {Blank single:19197::"excellent compliance","good compliance","fair compliance","poor compliance"} Aspirin: {Blank single:19197::"yes","no"} Recent stressors: {Blank single:19197::"yes","no"} Recurrent headaches: {Blank single:19197::"yes","no"} Visual changes: {Blank  single:19197::"yes","no"} Palpitations: {Blank single:19197::"yes","no"} Dyspnea: {Blank single:19197::"yes","no"} Chest pain: {Blank single:19197::"yes","no"} Lower extremity edema: {Blank single:19197::"yes","no"} Dizzy/lightheaded: {Blank single:19197::"yes","no"}  ANXIETY/DEPRESSION  Relevant past medical, surgical, family and social history reviewed and updated as indicated. Interim medical history since our last visit reviewed. Allergies and medications reviewed and updated.  Review of Systems  Per HPI unless specifically indicated above     Objective:    There were no vitals taken for this visit.  Wt Readings from Last 3 Encounters:  08/16/21 214 lb (97.1 kg)  07/14/21 214 lb (97.1 kg)  12/27/20 209 lb 12.8 oz (95.2 kg)    Physical Exam  Results for orders placed or performed in visit on 07/14/21  CBC with Differential/Platelet  Result Value Ref Range   WBC 6.9 3.4 - 10.8 x10E3/uL   RBC 5.10 3.77 - 5.28 x10E6/uL   Hemoglobin 14.6 11.1 - 15.9 g/dL   Hematocrit 67.0 08.0 - 46.6 %   MCV 88 79 - 97 fL   MCH 28.6 26.6 - 33.0 pg   MCHC 32.4 31.5 - 35.7 g/dL   RDW 64.0 24.3 - 81.0 %   Platelets 353 150 - 450 x10E3/uL   Neutrophils 61 Not Estab. %   Lymphs 27 Not Estab. %   Monocytes 9 Not Estab. %   Eos 2 Not Estab. %   Basos 1 Not Estab. %   Neutrophils Absolute 4.2 1.4 - 7.0 x10E3/uL   Lymphocytes Absolute 1.9 0.7 - 3.1 x10E3/uL   Monocytes Absolute 0.6 0.1 - 0.9 x10E3/uL   EOS (ABSOLUTE) 0.2 0.0 - 0.4 x10E3/uL   Basophils Absolute 0.1 0.0 - 0.2 x10E3/uL   Immature Granulocytes 0 Not Estab. %   Immature Grans (Abs) 0.0 0.0 - 0.1 x10E3/uL  Comprehensive metabolic panel  Result Value Ref Range   Glucose 106 (H) 70 - 99 mg/dL   BUN 11 6 - 24 mg/dL   Creatinine, Ser 6.06 0.57 - 1.00 mg/dL   eGFR 81 >99 OA/NOG/1.49   BUN/Creatinine Ratio 13 9 - 23   Sodium 138 134 - 144 mmol/L   Potassium 4.5 3.5 - 5.2 mmol/L   Chloride 100 96 - 106 mmol/L   CO2 20 20 - 29  mmol/L   Calcium 9.2 8.7 - 10.2 mg/dL   Total Protein 7.3 6.0 - 8.5 g/dL   Albumin 4.3 3.8 - 4.8 g/dL   Globulin, Total 3.0 1.5 - 4.5 g/dL   Albumin/Globulin Ratio 1.4 1.2 - 2.2   Bilirubin Total 0.6 0.0 - 1.2 mg/dL   Alkaline Phosphatase 78 44 - 121 IU/L   AST 18 0 - 40  IU/L   ALT 19 0 - 32 IU/L  Lipid panel  Result Value Ref Range   Cholesterol, Total 202 (H) 100 - 199 mg/dL   Triglycerides 95 0 - 149 mg/dL   HDL 45 >39 mg/dL   VLDL Cholesterol Cal 17 5 - 40 mg/dL   LDL Chol Calc (NIH) 140 (H) 0 - 99 mg/dL   Chol/HDL Ratio 4.5 (H) 0.0 - 4.4 ratio  TSH  Result Value Ref Range   TSH 2.200 0.450 - 4.500 uIU/mL  Urinalysis, Routine w reflex microscopic  Result Value Ref Range   Specific Gravity, UA 1.025 1.005 - 1.030   pH, UA 6.0 5.0 - 7.5   Color, UA Yellow Yellow   Appearance Ur Cloudy (A) Clear   Leukocytes,UA Negative Negative   Protein,UA Negative Negative/Trace   Glucose, UA Negative Negative   Ketones, UA Negative Negative   RBC, UA Negative Negative   Bilirubin, UA Negative Negative   Urobilinogen, Ur 0.2 0.2 - 1.0 mg/dL   Nitrite, UA Negative Negative  HgB A1c  Result Value Ref Range   Hgb A1c MFr Bld 5.8 (H) 4.8 - 5.6 %   Est. average glucose Bld gHb Est-mCnc 120 mg/dL  HIV Antibody (routine testing w rflx)  Result Value Ref Range   HIV Screen 4th Generation wRfx Non Reactive Non Reactive  Vitamin D (25 hydroxy)  Result Value Ref Range   Vit D, 25-Hydroxy 25.3 (L) 30.0 - 100.0 ng/mL      Assessment & Plan:   Problem List Items Addressed This Visit       Cardiovascular and Mediastinum   Hypertension - Primary     Other   Anxiety   Depression   High cholesterol   Morbid obesity ([place])     Follow up plan: No follow-ups on file.

## 2021-11-22 ENCOUNTER — Ambulatory Visit: Payer: Self-pay | Admitting: Nurse Practitioner

## 2021-11-22 DIAGNOSIS — I10 Essential (primary) hypertension: Secondary | ICD-10-CM

## 2021-11-22 DIAGNOSIS — E7800 Pure hypercholesterolemia, unspecified: Secondary | ICD-10-CM

## 2021-11-22 DIAGNOSIS — F3342 Major depressive disorder, recurrent, in full remission: Secondary | ICD-10-CM

## 2021-11-22 DIAGNOSIS — R7303 Prediabetes: Secondary | ICD-10-CM

## 2021-11-22 DIAGNOSIS — F419 Anxiety disorder, unspecified: Secondary | ICD-10-CM

## 2022-07-12 ENCOUNTER — Encounter: Payer: Self-pay | Admitting: Nurse Practitioner

## 2022-07-12 ENCOUNTER — Ambulatory Visit (INDEPENDENT_AMBULATORY_CARE_PROVIDER_SITE_OTHER): Payer: 59 | Admitting: Nurse Practitioner

## 2022-07-12 VITALS — BP 135/88 | HR 84 | Temp 98.8°F | Wt 218.3 lb

## 2022-07-12 DIAGNOSIS — I10 Essential (primary) hypertension: Secondary | ICD-10-CM

## 2022-07-12 DIAGNOSIS — Z1231 Encounter for screening mammogram for malignant neoplasm of breast: Secondary | ICD-10-CM

## 2022-07-12 DIAGNOSIS — E7800 Pure hypercholesterolemia, unspecified: Secondary | ICD-10-CM

## 2022-07-12 DIAGNOSIS — D229 Melanocytic nevi, unspecified: Secondary | ICD-10-CM

## 2022-07-12 DIAGNOSIS — R7303 Prediabetes: Secondary | ICD-10-CM | POA: Diagnosis not present

## 2022-07-12 DIAGNOSIS — F419 Anxiety disorder, unspecified: Secondary | ICD-10-CM

## 2022-07-12 DIAGNOSIS — Z23 Encounter for immunization: Secondary | ICD-10-CM | POA: Diagnosis not present

## 2022-07-12 DIAGNOSIS — F3342 Major depressive disorder, recurrent, in full remission: Secondary | ICD-10-CM

## 2022-07-12 DIAGNOSIS — M25522 Pain in left elbow: Secondary | ICD-10-CM

## 2022-07-12 DIAGNOSIS — Z114 Encounter for screening for human immunodeficiency virus [HIV]: Secondary | ICD-10-CM

## 2022-07-12 DIAGNOSIS — E559 Vitamin D deficiency, unspecified: Secondary | ICD-10-CM

## 2022-07-12 MED ORDER — PANTOPRAZOLE SODIUM 40 MG PO TBEC: 40.0000 mg | DELAYED_RELEASE_TABLET | Freq: Every day | ORAL | 1 refills | Status: DC

## 2022-07-12 MED ORDER — LEVOCETIRIZINE DIHYDROCHLORIDE 5 MG PO TABS: 5.0000 mg | ORAL_TABLET | Freq: Every evening | ORAL | 4 refills | Status: DC

## 2022-07-12 MED ORDER — ROSUVASTATIN CALCIUM 20 MG PO TABS: 20.0000 mg | ORAL_TABLET | Freq: Every day | ORAL | 1 refills | Status: DC

## 2022-07-12 MED ORDER — FLUTICASONE PROPIONATE 50 MCG/ACT NA SUSP: 2.0000 | Freq: Every day | NASAL | 6 refills | Status: DC

## 2022-07-12 MED ORDER — CITALOPRAM HYDROBROMIDE 40 MG PO TABS: 40.0000 mg | ORAL_TABLET | Freq: Every day | ORAL | 1 refills | Status: DC

## 2022-07-12 MED ORDER — METHYLPREDNISOLONE 4 MG PO TBPK: ORAL_TABLET | ORAL | 0 refills | Status: DC

## 2022-07-12 MED ORDER — LINACLOTIDE 145 MCG PO CAPS: 145.0000 ug | ORAL_CAPSULE | Freq: Every day | ORAL | 1 refills | Status: DC

## 2022-07-12 MED ORDER — LISINOPRIL 5 MG PO TABS: 5.0000 mg | ORAL_TABLET | Freq: Every day | ORAL | 1 refills | Status: DC

## 2022-07-12 NOTE — Assessment & Plan Note (Signed)
Labs ordered at visit today.  Will make recommendations based on lab results.   

## 2022-07-12 NOTE — Assessment & Plan Note (Signed)
Chronic.  Controlled.  Continue with current medication regimen of Lisinopril '5mg'$  daily. Return to clinic in 3 months for reevaluation.  Call sooner if concerns arise.

## 2022-07-12 NOTE — Addendum Note (Signed)
Addended by: Jon Billings on: 07/12/2022 04:36 PM   Modules accepted: Orders

## 2022-07-12 NOTE — Assessment & Plan Note (Signed)
Chronic.  Controlled.  Patient would like to increase to Celexa '40mg'$  daily.  She has already been taking '30mg'$  daily and doing well.  PHQ9 and GAD7 are 0 at visit today. Return to clinic in 3 months for reevaluation.  Call sooner if concerns arise.

## 2022-07-12 NOTE — Progress Notes (Addendum)
BP 135/88   Pulse 84   Temp 98.8 F (37.1 C) (Oral)   Wt 218 lb 4.8 oz (99 kg)   SpO2 98%   BMI 38.49 kg/m    Subjective:    Patient ID: Victoria Shepard, female    DOB: [birth year], 51 y.o.   MRN: [id number]  HPI: Victoria Shepard is a 51 y.o. female  Chief Complaint  Patient presents with   Medication Management        Weight Check    Patient would like to discuss options for weight loss    Elbow Pain     L elbow. Denies recent fall/injury   HYPERTENSION Currently not on any medications.   Hypertension status: stable  Satisfied with current treatment? yes Duration of hypertension: chronic BP monitoring frequency:  not checking BP range:  BP medication side effects:  no Medication compliance: good compliance Aspirin: no Recurrent headaches: no Visual changes: no Palpitations: no Dyspnea: no Chest pain: no Lower extremity edema: no Dizzy/lightheaded: no   DEPRESSION Currently is not taking Celexa 40 MG and Klonopin PRN -- she tapered herself off of this.  She does see therapy, Apolonio Schneiders -- every week.  Takes Melatonin 10 MG for sleep. Mood status: stable Satisfied with current treatment?: yes Symptom severity: mild  Duration of current treatment : chronic Side effects: no Medication compliance: good compliance Psychotherapy/counseling: yes current Previous psychiatric medications:  Depressed mood: no Anxious mood: a little with her mother having stroke Anhedonia: no Significant weight loss or gain: no Insomnia: none Fatigue: no Feelings of worthlessness or guilt: no Impaired concentration/indecisiveness: no Suicidal ideations: no Hopelessness: no Crying spells: no    07/12/2022    2:55 PM 08/16/2021    8:56 AM 07/14/2021    8:44 AM 12/27/2020    9:48 AM 10/05/2020   10:20 AM  Depression screen PHQ 2/9  Decreased Interest 0 0 0 0 0  Down, Depressed, Hopeless 0 0 0 0 0  PHQ - 2 Score 0 0 0 0 0  Altered sleeping 0 0 0 0   Tired, decreased energy 1 0  0 0   Change in appetite 2 0 0 0   Feeling bad or failure about yourself  0 0 0 0   Trouble concentrating 0 0 0 0   Moving slowly or fidgety/restless 0 0 0 0   Suicidal thoughts 0 0 0 0   PHQ-9 Score 3 0 0 0   Difficult doing work/chores Not difficult at all   Not difficult at all       07/12/2022    2:55 PM 08/16/2021    8:57 AM 12/27/2020    9:48 AM 05/24/2020   10:24 AM  GAD 7 : Generalized Anxiety Score  Nervous, Anxious, on Edge 2 0 1 0  Control/stop worrying 0 0 0 0  Worry too much - different things 0 0 0 0  Trouble relaxing 0 0 0 0  Restless 0 0 0 0  Easily annoyed or irritable 0 0 0 0  Afraid - awful might happen 0 0 0 0  Total GAD 7 Score 2 0 1 0  Anxiety Difficulty Not difficult at all      GERD Needs refills on Protonix, which offers benefit.  Linzess for IBS -- more constipation. GERD control status: stableSatisfied with current treatment? yes Heartburn frequency: a few times a week Medication side effects: no  Medication compliance: stable Previous GERD medications: none Antacid use frequency:  none  Dysphagia: no Odynophagia:  no Hematemesis: no Blood in stool: no EGD: no  Patient states she has some elbow pain on the left elbow.  States it has been ongoing for a couple of months.  She is using her arms all day.     Relevant past medical, surgical, family and social history reviewed and updated as indicated. Interim medical history since our last visit reviewed. Allergies and medications reviewed and updated.  Review of Systems  Constitutional:  Negative for activity change, appetite change, diaphoresis, fatigue and fever.  Respiratory:  Negative for cough, chest tightness and shortness of breath.   Cardiovascular:  Negative for chest pain, palpitations and leg swelling.  Gastrointestinal: Negative.   Endocrine: Negative for cold intolerance, heat intolerance, polydipsia, polyphagia and polyuria.  Musculoskeletal:        Left elbow pain  Neurological:  Negative.   Psychiatric/Behavioral:  Positive for dysphoric mood. Negative for suicidal ideas. The patient is nervous/anxious.     Per HPI unless specifically indicated above     Objective:    BP 135/88   Pulse 84   Temp 98.8 F (37.1 C) (Oral)   Wt 218 lb 4.8 oz (99 kg)   SpO2 98%   BMI 38.49 kg/m   Wt Readings from Last 3 Encounters:  07/12/22 218 lb 4.8 oz (99 kg)  08/16/21 214 lb (97.1 kg)  07/14/21 214 lb (97.1 kg)    Physical Exam Vitals and nursing note reviewed.  Constitutional:      General: She is awake. She is not in acute distress.    Appearance: Normal appearance. She is well-developed and well-groomed. She is obese. She is not ill-appearing, toxic-appearing or diaphoretic.  HENT:     Head: Normocephalic.     Right Ear: Hearing and external ear normal.     Left Ear: Hearing and external ear normal.     Nose: Nose normal.     Mouth/Throat:     Mouth: Mucous membranes are moist.     Pharynx: Oropharynx is clear.  Eyes:     General: Lids are normal.        Right eye: No discharge.        Left eye: No discharge.     Extraocular Movements: Extraocular movements intact.     Conjunctiva/sclera: Conjunctivae normal.     Pupils: Pupils are equal, round, and reactive to light.  Neck:     Thyroid: No thyromegaly.     Vascular: No carotid bruit.  Cardiovascular:     Rate and Rhythm: Normal rate and regular rhythm.     Heart sounds: Normal heart sounds. No murmur heard.    No gallop.  Pulmonary:     Effort: Pulmonary effort is normal. No accessory muscle usage or respiratory distress.     Breath sounds: Normal breath sounds. No wheezing or rales.  Abdominal:     General: Bowel sounds are normal.     Palpations: Abdomen is soft. There is no hepatomegaly or splenomegaly.  Musculoskeletal:     Cervical back: Normal range of motion and neck supple.     Right lower leg: No edema.     Left lower leg: No edema.  Lymphadenopathy:     Cervical: No cervical  adenopathy.  Skin:    General: Skin is warm and dry.     Capillary Refill: Capillary refill takes less than 2 seconds.  Neurological:     General: No focal deficit present.     Mental Status: She  is alert and oriented to person, place, and time. Mental status is at baseline.  Psychiatric:        Attention and Perception: Attention normal.        Mood and Affect: Mood normal.        Behavior: Behavior normal. Behavior is cooperative.        Thought Content: Thought content normal.        Judgment: Judgment normal.     Results for orders placed or performed in visit on 07/14/21  CBC with Differential/Platelet  Result Value Ref Range   WBC 6.9 3.4 - 10.8 x10E3/uL   RBC 5.10 3.77 - 5.28 x10E6/uL   Hemoglobin 14.6 11.1 - 15.9 g/dL   Hematocrit 45.1 34.0 - 46.6 %   MCV 88 79 - 97 fL   MCH 28.6 26.6 - 33.0 pg   MCHC 32.4 31.5 - 35.7 g/dL   RDW 12.5 11.7 - 15.4 %   Platelets 353 150 - 450 x10E3/uL   Neutrophils 61 Not Estab. %   Lymphs 27 Not Estab. %   Monocytes 9 Not Estab. %   Eos 2 Not Estab. %   Basos 1 Not Estab. %   Neutrophils Absolute 4.2 1.4 - 7.0 x10E3/uL   Lymphocytes Absolute 1.9 0.7 - 3.1 x10E3/uL   Monocytes Absolute 0.6 0.1 - 0.9 x10E3/uL   EOS (ABSOLUTE) 0.2 0.0 - 0.4 x10E3/uL   Basophils Absolute 0.1 0.0 - 0.2 x10E3/uL   Immature Granulocytes 0 Not Estab. %   Immature Grans (Abs) 0.0 0.0 - 0.1 x10E3/uL  Comprehensive metabolic panel  Result Value Ref Range   Glucose 106 (H) 70 - 99 mg/dL   BUN 11 6 - 24 mg/dL   Creatinine, Ser 0.88 0.57 - 1.00 mg/dL   eGFR 81 >59 mL/min/1.73   BUN/Creatinine Ratio 13 9 - 23   Sodium 138 134 - 144 mmol/L   Potassium 4.5 3.5 - 5.2 mmol/L   Chloride 100 96 - 106 mmol/L   CO2 20 20 - 29 mmol/L   Calcium 9.2 8.7 - 10.2 mg/dL   Total Protein 7.3 6.0 - 8.5 g/dL   Albumin 4.3 3.8 - 4.8 g/dL   Globulin, Total 3.0 1.5 - 4.5 g/dL   Albumin/Globulin Ratio 1.4 1.2 - 2.2   Bilirubin Total 0.6 0.0 - 1.2 mg/dL   Alkaline Phosphatase  78 44 - 121 IU/L   AST 18 0 - 40 IU/L   ALT 19 0 - 32 IU/L  Lipid panel  Result Value Ref Range   Cholesterol, Total 202 (H) 100 - 199 mg/dL   Triglycerides 95 0 - 149 mg/dL   HDL 45 >39 mg/dL   VLDL Cholesterol Cal 17 5 - 40 mg/dL   LDL Chol Calc (NIH) 140 (H) 0 - 99 mg/dL   Chol/HDL Ratio 4.5 (H) 0.0 - 4.4 ratio  TSH  Result Value Ref Range   TSH 2.200 0.450 - 4.500 uIU/mL  Urinalysis, Routine w reflex microscopic  Result Value Ref Range   Specific Gravity, UA 1.025 1.005 - 1.030   pH, UA 6.0 5.0 - 7.5   Color, UA Yellow Yellow   Appearance Ur Cloudy (A) Clear   Leukocytes,UA Negative Negative   Protein,UA Negative Negative/Trace   Glucose, UA Negative Negative   Ketones, UA Negative Negative   RBC, UA Negative Negative   Bilirubin, UA Negative Negative   Urobilinogen, Ur 0.2 0.2 - 1.0 mg/dL   Nitrite, UA Negative Negative  HgB A1c  Result  Value Ref Range   Hgb A1c MFr Bld 5.8 (H) 4.8 - 5.6 %   Est. average glucose Bld gHb Est-mCnc 120 mg/dL  HIV Antibody (routine testing w rflx)  Result Value Ref Range   HIV Screen 4th Generation wRfx Non Reactive Non Reactive  Vitamin D (25 hydroxy)  Result Value Ref Range   Vit D, 25-Hydroxy 25.3 (L) 30.0 - 100.0 ng/mL      Assessment & Plan:   Problem List Items Addressed This Visit       Cardiovascular and Mediastinum   Hypertension    Chronic.  Controlled.  Continue with current medication regimen of Lisinopril 14m daily. Return to clinic in 3 months for reevaluation.  Call sooner if concerns arise.        Relevant Medications   lisinopril (ZESTRIL) 5 MG tablet   rosuvastatin (CRESTOR) 20 MG tablet   Other Relevant Orders   Comp Met (CMET)     Other   Anxiety    Chronic.  Controlled.  Patient would like to increase to Celexa 463mdaily.  PHQ9 and GAD7 are 0 at visit today.   Return to clinic in 3 months for reevaluation.  Call sooner if concerns arise.        Relevant Medications   citalopram (CELEXA) 40 MG  tablet   Other Relevant Orders   Comp Met (CMET)   Depression - Primary    Chronic.  Controlled.  Patient would like to increase to Celexa 4057maily.  She has already been taking 10m44mily and doing well.  PHQ9 and GAD7 are 0 at visit today. Return to clinic in 3 months for reevaluation.  Call sooner if concerns arise.       Relevant Medications   citalopram (CELEXA) 40 MG tablet   Other Relevant Orders   Comp Met (CMET)   Morbid obesity (HCC)[place] Recommend a healthy lifestyle through diet and exercise.  Would like to see medical weight management for weight loss purposes.       Relevant Orders   Amb Ref to Medical Weight Management   Prediabetes    Labs ordered at visit today.  Will make recommendations based on lab results.       Relevant Orders   HgB A1c   Other Visit Diagnoses     Vitamin D deficiency       Labs ordered today. Will make recommendations based on lab results.   Relevant Orders   Vitamin D (25 hydroxy)   Hypercholesteremia       Labs ordered today. Will make recommendations based on lab results.   Relevant Medications   lisinopril (ZESTRIL) 5 MG tablet   rosuvastatin (CRESTOR) 20 MG tablet   Other Relevant Orders   Lipid Profile   Left elbow pain       Likely tendonitis. Will treat with short course of steroids.  Follow up if not improved.   Screening for HIV (human immunodeficiency virus)       Relevant Orders   HIV Antibody (routine testing w rflx)   Need for influenza vaccination       Relevant Orders   Flu Vaccine QUAD 6+ mos PF IM (Fluarix Quad PF) (Completed)   Atypical mole       Relevant Orders   Ambulatory referral to Dermatology        Follow up plan: No follow-ups on file.

## 2022-07-12 NOTE — Assessment & Plan Note (Signed)
Chronic.  Controlled.  Patient would like to increase to Celexa '40mg'$  daily.  PHQ9 and GAD7 are 0 at visit today.   Return to clinic in 3 months for reevaluation.  Call sooner if concerns arise.

## 2022-07-12 NOTE — Assessment & Plan Note (Signed)
Recommend a healthy lifestyle through diet and exercise.  Would like to see medical weight management for weight loss purposes.

## 2022-07-13 ENCOUNTER — Encounter: Payer: Self-pay | Admitting: Nurse Practitioner

## 2022-07-13 ENCOUNTER — Telehealth: Payer: Self-pay

## 2022-07-13 LAB — LIPID PANEL
Chol/HDL Ratio: 3.4 ratio (ref 0.0–4.4)
Cholesterol, Total: 160 mg/dL (ref 100–199)
HDL: 47 mg/dL (ref >39)
LDL Chol Calc (NIH): 90 mg/dL (ref 0–99)
Triglycerides: 132 mg/dL (ref 0–149)
VLDL Cholesterol Cal: 23 mg/dL (ref 5–40)

## 2022-07-13 LAB — COMPREHENSIVE METABOLIC PANEL
ALT: 23 IU/L (ref 0–32)
AST: 20 IU/L (ref 0–40)
Albumin/Globulin Ratio: 1.7 (ref 1.2–2.2)
Albumin: 4.7 g/dL (ref 3.9–4.9)
Alkaline Phosphatase: 74 IU/L (ref 44–121)
BUN/Creatinine Ratio: 12 (ref 9–23)
BUN: 10 mg/dL (ref 6–24)
Bilirubin Total: 0.8 mg/dL (ref 0.0–1.2)
CO2: 19 mmol/L — ABNORMAL LOW (ref 20–29)
Calcium: 9.6 mg/dL (ref 8.7–10.2)
Chloride: 103 mmol/L (ref 96–106)
Creatinine, Ser: 0.84 mg/dL (ref 0.57–1.00)
Globulin, Total: 2.8 g/dL (ref 1.5–4.5)
Glucose: 88 mg/dL (ref 70–99)
Potassium: 4.4 mmol/L (ref 3.5–5.2)
Sodium: 140 mmol/L (ref 134–144)
Total Protein: 7.5 g/dL (ref 6.0–8.5)
eGFR: 85 mL/min/{1.73_m2} (ref >59)

## 2022-07-13 LAB — HEMOGLOBIN A1C
Est. average glucose Bld gHb Est-mCnc: 120 mg/dL
Hgb A1c MFr Bld: 5.8 % — ABNORMAL HIGH (ref 4.8–5.6)

## 2022-07-13 LAB — HIV ANTIBODY (ROUTINE TESTING W REFLEX): HIV Screen 4th Generation wRfx: NONREACTIVE

## 2022-07-13 LAB — VITAMIN D 25 HYDROXY (VIT D DEFICIENCY, FRACTURES): Vit D, 25-Hydroxy: 20.7 ng/mL — ABNORMAL LOW (ref 30.0–100.0)

## 2022-07-13 MED ORDER — VITAMIN D (ERGOCALCIFEROL) 1.25 MG (50000 UNIT) PO CAPS: 50000.0000 [IU] | ORAL_CAPSULE | ORAL | 1 refills | Status: DC

## 2022-07-13 NOTE — Progress Notes (Signed)
Hi Akiba. It was good to see you yesterday.  Overall your lab work looks good.  Your vitamin D remains low.  I want you to restart the Vitamin D 50,000IU weekly. I sent this into the pharmacy for you..  Your A1c remains in the prediabetic range at 5.8.  Your cholesterol improved from prior which is great news.    Your HIV was negative.  I'll see you at our next visit. Have a great weekend.

## 2022-07-13 NOTE — Addendum Note (Signed)
Addended by: Jon Billings on: 07/13/2022 08:08 AM   Modules accepted: Orders

## 2022-07-13 NOTE — Telephone Encounter (Signed)
-----   Message from Jon Billings, NP sent at 07/13/2022  8:08 AM EDT ----- I don't think I sent a message about her. But if I did, I'm SORRY!!! Can we make her mammo appt?

## 2022-07-13 NOTE — Telephone Encounter (Signed)
Attempted to call [facility] to make appt for patient, was on hold for over 4 min.

## 2022-07-13 NOTE — Addendum Note (Signed)
Addended by: Jon Billings on: 07/13/2022 08:10 AM   Modules accepted: Orders

## 2022-07-18 ENCOUNTER — Encounter (INDEPENDENT_AMBULATORY_CARE_PROVIDER_SITE_OTHER): Payer: Medicaid Other | Admitting: Family Medicine

## 2022-07-20 ENCOUNTER — Encounter: Payer: Self-pay | Admitting: Nurse Practitioner

## 2022-07-24 ENCOUNTER — Encounter: Payer: Self-pay | Admitting: Nurse Practitioner

## 2022-08-05 NOTE — Progress Notes (Deleted)
PCP:  Jon Billings, NP   No chief complaint on file.    HPI:      [name] is a 51 y.o. G3P3 who LMP was No LMP recorded. Patient has had an implant., presents today for her annual examination.  Her menses are irregular since nexplanon placement 12/20. Was having heavy bleeding for a few wks, now daily spotting/brown d/c.  Was amenorrheic with 3 nexplanons in past. Dysmenorrhea none.   Sex activity: single partner, contraception -  Nexplanon placed 09/15/19 Last Pap: October 24, 2018  Results were: no abnormalities /neg HPV DNA  Hx of STDs: HSV, takes valtrex prn. Had neg HIV testing 12/20. Wants again 6 months after potential exposure (10/20), although partner was HIV neg at that time, but has pt worried.   Last mammogram: December 18, 2018  Results were: normal--routine follow-up in 12 months; has appt 11/23 There is no FH of breast cancer. There is no FH of ovarian cancer. The patient does do self-breast exams.  Tobacco use: The patient denies current or previous tobacco use. Alcohol use: none No drug use.  Exercise: very active Is losing wt with wt watchers and exercise. Would like to try a couple months phentermine. Discussed with Dr. Kenton Kingfisher in the past. Not on HTN meds   Colonscopy:   She does get adequate calcium but not Vitamin D in her diet. Labs with PCP  Past Medical History:  Diagnosis Date   Anxiety    Depression    Heart murmur    Herpes    History of abnormal mammogram 2010   History of cellulitis    belly button   Hypercholesteremia    Hypertension    Nexplanon in place    placed 10/11/14, remove 10/11/17   OCD (obsessive compulsive disorder)     Past Surgical History:  Procedure Laterality Date   BIOPSY BREAST  04/2010   was normal   BREAST EXCISIONAL BIOPSY Left 2011   benign   BREAST SURGERY Left 2011   COLONOSCOPY  2010   COSMETIC SURGERY  2012   HERNIA REPAIR  2012   LIPOSUCTION  2012   MM DUCTOGRAM BILAT R/S  2010   due to  bloody breast discharge    Family History  Problem Relation Age of Onset   Hypertension Mother    Hyperlipidemia Mother    Diabetes Mother    Stroke Mother    Heart disease Father        CABG   Hypertension Father    Mental illness Brother        anxiety   Cancer Maternal Grandmother        lung   Stroke Paternal Grandmother    Stroke Paternal Grandfather     Social History   Socioeconomic History   Marital status: Divorced    Spouse name: Not on file   Number of children: Not on file   Years of education: Not on file   Highest education level: Not on file  Occupational History   Not on file  Tobacco Use   Smoking status: Never   Smokeless tobacco: Never  Vaping Use   Vaping Use: Never used  Substance and Sexual Activity   Alcohol use: No   Drug use: No   Sexual activity: Yes    Birth control/protection: Implant  Other Topics Concern   Not on file  Social History Narrative   Not on file   Social Determinants of Health  Financial Resource Strain: Not on file  Food Insecurity: Not on file  Transportation Needs: Not on file  Physical Activity: Not on file  Stress: Not on file  Social Connections: Not on file  Intimate Partner Violence: Not on file     Current Outpatient Medications:    albuterol (VENTOLIN HFA) 108 (90 Base) MCG/ACT inhaler, Inhale 2 puffs into the lungs every 6 (six) hours as needed for wheezing or shortness of breath., Disp: 18 g, Rfl: 2   citalopram (CELEXA) 40 MG tablet, Take 1 tablet (40 mg total) by mouth daily., Disp: 90 tablet, Rfl: 1   fluticasone (FLONASE) 50 MCG/ACT nasal spray, Place 2 sprays into both nostrils daily., Disp: 16 g, Rfl: 6   levocetirizine (XYZAL) 5 MG tablet, Take 1 tablet (5 mg total) by mouth every evening., Disp: 90 tablet, Rfl: 4   linaclotide (LINZESS) 145 MCG CAPS capsule, Take 1 capsule (145 mcg total) by mouth daily before breakfast., Disp: 90 capsule, Rfl: 1   lisinopril (ZESTRIL) 5 MG tablet, Take 1  tablet (5 mg total) by mouth daily., Disp: 90 tablet, Rfl: 1   MELATONIN PO, Take 1 Dose by mouth daily., Disp: , Rfl:    methylPREDNISolone (MEDROL DOSEPAK) 4 MG TBPK tablet, Take as directed, Disp: 21 tablet, Rfl: 0   pantoprazole (PROTONIX) 40 MG tablet, Take 1 tablet (40 mg total) by mouth daily., Disp: 90 tablet, Rfl: 1   rosuvastatin (CRESTOR) 20 MG tablet, Take 1 tablet (20 mg total) by mouth daily., Disp: 90 tablet, Rfl: 1   valACYclovir (VALTREX) 500 MG tablet, TAKE 1 TABLET BY MOUTH EVERY DAY, Disp: 90 tablet, Rfl: 1   Vitamin D, Ergocalciferol, (DRISDOL) 1.25 MG (50000 UNIT) CAPS capsule, Take 1 capsule (50,000 Units total) by mouth every 7 (seven) days., Disp: 12 capsule, Rfl: 1  Current Facility-Administered Medications:    etonogestrel (NEXPLANON) implant 68 mg, 68 mg, Subdermal, Once, Evertte Sones B, PA-C     ROS:  Review of Systems  Constitutional:  Negative for fatigue, fever and unexpected weight change.  Respiratory:  Negative for cough, shortness of breath and wheezing.   Cardiovascular:  Negative for chest pain, palpitations and leg swelling.  Gastrointestinal:  Negative for blood in stool, constipation, diarrhea, nausea and vomiting.  Endocrine: Negative for cold intolerance, heat intolerance and polyuria.  Genitourinary:  Negative for dyspareunia, dysuria, flank pain, frequency, genital sores, hematuria, menstrual problem, pelvic pain, urgency, vaginal bleeding, vaginal discharge and vaginal pain.  Musculoskeletal:  Negative for back pain, joint swelling and myalgias.  Skin:  Negative for rash.  Neurological:  Negative for dizziness, syncope, light-headedness, numbness and headaches.  Hematological:  Negative for adenopathy.  Psychiatric/Behavioral:  Negative for agitation, confusion, sleep disturbance and suicidal ideas. The patient is not nervous/anxious.    BREAST: No symptoms   Objective: There were no vitals taken for this visit.   Physical  Exam Constitutional:      Appearance: She is well-developed.  Genitourinary:     Vulva normal.     No vaginal discharge, erythema or tenderness.      Right Adnexa: not tender and no mass present.    Left Adnexa: not tender and no mass present.    No cervical polyp.     Uterus is not enlarged or tender.  Breasts:    Right: No mass, nipple discharge, skin change or tenderness.     Left: No mass, nipple discharge, skin change or tenderness.  Neck:     Thyroid: No  thyromegaly.  Cardiovascular:     Rate and Rhythm: Normal rate and regular rhythm.     Heart sounds: Normal heart sounds. No murmur heard. Pulmonary:     Effort: Pulmonary effort is normal.     Breath sounds: Normal breath sounds.  Abdominal:     Palpations: Abdomen is soft.     Tenderness: There is no abdominal tenderness. There is no guarding.  Musculoskeletal:        General: Normal range of motion.     Cervical back: Normal range of motion.  Neurological:     General: No focal deficit present.     Mental Status: [name] and oriented to person, place, and time.     Cranial Nerves: No cranial nerve deficit.  Skin:    General: Skin is warm and dry.  Psychiatric:        Mood and Affect: Mood normal.        Behavior: Behavior normal.        Thought Content: Thought content normal.        Judgment: Judgment normal.  Vitals reviewed.     Assessment/Plan: Encounter for annual routine gynecological examination  Encounter for surveillance of implantable subdermal contraceptive--BTB with nexplanon. F/u in a month or so if daily bleeding continues. Will add POP. Lighter flow now  Screening for STD (sexually transmitted disease) - Plan: HIV Antibody (routine testing w rflx); Check HIV in 4/21.  Encounter for screening mammogram for malignant neoplasm of breast - Plan: MM 3D SCREEN BREAST BILATERAL; pt to sched mammo  Weight loss counseling, encounter for - Plan: phentermine (ADIPEX-P) 37.5 MG tablet; cont wt  watchers and exercise. Phentermine for 2 months. F/u prn.   No orders of the defined types were placed in this encounter.            GYN counsel breast self exam, mammography screening, adequate intake of calcium and vitamin D, diet and exercise     F/U  No follow-ups on file.  Marabella Popiel B. Kaylen Motl, PA-C 08/05/2022 2:30 PM

## 2022-08-07 ENCOUNTER — Ambulatory Visit: Payer: Medicaid Other | Admitting: Obstetrics and Gynecology

## 2022-08-07 ENCOUNTER — Encounter: Payer: Self-pay | Admitting: Nurse Practitioner

## 2022-08-07 DIAGNOSIS — Z1211 Encounter for screening for malignant neoplasm of colon: Secondary | ICD-10-CM

## 2022-08-07 DIAGNOSIS — Z1231 Encounter for screening mammogram for malignant neoplasm of breast: Secondary | ICD-10-CM

## 2022-08-07 DIAGNOSIS — Z124 Encounter for screening for malignant neoplasm of cervix: Secondary | ICD-10-CM

## 2022-08-07 DIAGNOSIS — Z1151 Encounter for screening for human papillomavirus (HPV): Secondary | ICD-10-CM

## 2022-08-07 DIAGNOSIS — Z3046 Encounter for surveillance of implantable subdermal contraceptive: Secondary | ICD-10-CM

## 2022-08-07 DIAGNOSIS — Z01419 Encounter for gynecological examination (general) (routine) without abnormal findings: Secondary | ICD-10-CM

## 2022-08-13 NOTE — Progress Notes (Unsigned)
   There were no vitals taken for this visit.   Subjective:    Patient ID: Victoria Shepard, female    DOB: 1971/08/24, 51 y.o.   MRN: [id number]  HPI: Victoria Shepard is a 51 y.o. female  No chief complaint on file.   Relevant past medical, surgical, family and social history reviewed and updated as indicated. Interim medical history since our last visit reviewed. Allergies and medications reviewed and updated.  Review of Systems  Per HPI unless specifically indicated above     Objective:    There were no vitals taken for this visit.  Wt Readings from Last 3 Encounters:  07/12/22 218 lb 4.8 oz (99 kg)  08/16/21 214 lb (97.1 kg)  07/14/21 214 lb (97.1 kg)    Physical Exam  Results for orders placed or performed in visit on 07/12/22  Vitamin D (25 hydroxy)  Result Value Ref Range   Vit D, 25-Hydroxy 20.7 (L) 30.0 - 100.0 ng/mL  Lipid Profile  Result Value Ref Range   Cholesterol, Total 160 100 - 199 mg/dL   Triglycerides 132 0 - 149 mg/dL   HDL 47 >39 mg/dL   VLDL Cholesterol Cal 23 5 - 40 mg/dL   LDL Chol Calc (NIH) 90 0 - 99 mg/dL   Chol/HDL Ratio 3.4 0.0 - 4.4 ratio  HgB A1c  Result Value Ref Range   Hgb A1c MFr Bld 5.8 (H) 4.8 - 5.6 %   Est. average glucose Bld gHb Est-mCnc 120 mg/dL  Comp Met (CMET)  Result Value Ref Range   Glucose 88 70 - 99 mg/dL   BUN 10 6 - 24 mg/dL   Creatinine, Ser 0.84 0.57 - 1.00 mg/dL   eGFR 85 >59 mL/min/1.73   BUN/Creatinine Ratio 12 9 - 23   Sodium 140 134 - 144 mmol/L   Potassium 4.4 3.5 - 5.2 mmol/L   Chloride 103 96 - 106 mmol/L   CO2 19 (L) 20 - 29 mmol/L   Calcium 9.6 8.7 - 10.2 mg/dL   Total Protein 7.5 6.0 - 8.5 g/dL   Albumin 4.7 3.9 - 4.9 g/dL   Globulin, Total 2.8 1.5 - 4.5 g/dL   Albumin/Globulin Ratio 1.7 1.2 - 2.2   Bilirubin Total 0.8 0.0 - 1.2 mg/dL   Alkaline Phosphatase 74 44 - 121 IU/L   AST 20 0 - 40 IU/L   ALT 23 0 - 32 IU/L  HIV Antibody (routine testing w rflx)  Result Value Ref Range   HIV  Screen 4th Generation wRfx Non Reactive Non Reactive      Assessment & Plan:   Problem List Items Addressed This Visit   None    Follow up plan: No follow-ups on file.

## 2022-08-14 ENCOUNTER — Encounter: Payer: Self-pay | Admitting: Nurse Practitioner

## 2022-08-14 ENCOUNTER — Ambulatory Visit (INDEPENDENT_AMBULATORY_CARE_PROVIDER_SITE_OTHER): Payer: 59 | Admitting: Nurse Practitioner

## 2022-08-14 VITALS — BP 116/84 | HR 87 | Temp 98.8°F | Wt 219.6 lb

## 2022-08-14 DIAGNOSIS — M25522 Pain in left elbow: Secondary | ICD-10-CM | POA: Diagnosis not present

## 2022-08-17 ENCOUNTER — Encounter: Payer: Self-pay | Admitting: Nurse Practitioner

## 2022-08-20 ENCOUNTER — Telehealth: Payer: Self-pay

## 2022-08-20 NOTE — Telephone Encounter (Signed)
-----   Message from Jon Billings, NP sent at 08/20/2022  4:20 PM EST ----- Can we call and get her mammogram scheduled?

## 2022-08-20 NOTE — Telephone Encounter (Signed)
Spoke with [place], states she had an appt for mammo 11/13 but patient cancelled due to starting new job and told [place]ville that she will r/s mammo.

## 2022-08-24 ENCOUNTER — Encounter: Payer: Self-pay | Admitting: Nurse Practitioner

## 2022-09-02 ENCOUNTER — Other Ambulatory Visit: Payer: Self-pay | Admitting: Nurse Practitioner

## 2022-09-03 NOTE — Telephone Encounter (Signed)
Unable to refill per protocol, Rx request is too soon. Last refill 07/12/22 for 90 and 1 refill.E-Prescribing Status: Receipt confirmed by pharmacy (07/12/2022  3:40 PM EDT). Will refuse.  Requested Prescriptions  Pending Prescriptions Disp Refills   citalopram (CELEXA) 40 MG tablet [Pharmacy Med Name: CITALOPRAM HBR 40 MG TABLET] 90 tablet 1    Sig: TAKE 1 TABLET BY MOUTH EVERY DAY     Psychiatry:  Antidepressants - SSRI Passed - 09/02/2022  8:49 AM      Passed - Completed PHQ-2 or PHQ-9 in the last 360 days      Passed - Valid encounter within last 6 months    Recent Outpatient Visits           2 weeks ago Left elbow pain   [facility] Jon Billings, NP   1 month ago Recurrent major depressive disorder, in full remission ([place])   [facility] Jon Billings, NP   1 year ago Recurrent major depressive disorder, in full remission ([place])   [facility] Jon Billings, NP   1 year ago Annual physical exam   [facility] Jon Billings, NP   1 year ago Recurrent major depressive disorder, in full remission ([place])   [place], Jolene T, NP       Future Appointments             In 5 months Ralene Bathe, MD [place]

## 2022-09-13 ENCOUNTER — Ambulatory Visit: Payer: Medicaid Other | Admitting: Obstetrics and Gynecology

## 2022-09-20 ENCOUNTER — Ambulatory Visit: Payer: Medicaid Other | Admitting: Obstetrics and Gynecology

## 2022-10-22 ENCOUNTER — Encounter: Payer: Self-pay | Admitting: Obstetrics and Gynecology

## 2022-10-24 NOTE — Progress Notes (Unsigned)
PCP:  Jon Billings, NP   No chief complaint on file.    HPI:      [name] is a 52 y.o. G3P3 who LMP was No LMP recorded. Patient has had an implant., presents today for her annual examination.  Her menses are irregular since nexplanon placement 12/20. Was having heavy bleeding for a few wks, now daily spotting/brown d/c.  Was amenorrheic with 3 nexplanons in past. Dysmenorrhea none.   Sex activity: single partner, contraception -  Nexplanon placed 09/15/19 Last Pap: October 24, 2018  Results were: no abnormalities /neg HPV DNA  Hx of STDs: HSV, takes valtrex prn. Had neg HIV testing 12/20. Wants again 6 months after potential exposure (10/20), although partner was HIV neg at that time, but has pt worried.   Last mammogram: December 18, 2018  Results were: normal--routine follow-up in 12 months There is no FH of breast cancer. There is no FH of ovarian cancer. The patient does do self-breast exams.  Tobacco use: The patient denies current or previous tobacco use. Alcohol use: none No drug use.  Exercise: very active Is losing wt with wt watchers and exercise. Would like to try a couple months phentermine. Discussed with Dr. Kenton Kingfisher in the past. Not on HTN meds   Colonoscopy: ???  She does get adequate calcium but not Vitamin D in her diet. Labs with PCP  Past Medical History:  Diagnosis Date   Anxiety    Depression    Heart murmur    Herpes    History of abnormal mammogram 2010   History of cellulitis    belly button   Hypercholesteremia    Hypertension    Nexplanon in place    placed 10/11/14, remove 10/11/17   OCD (obsessive compulsive disorder)     Past Surgical History:  Procedure Laterality Date   BIOPSY BREAST  04/2010   was normal   BREAST EXCISIONAL BIOPSY Left 2011   benign   BREAST SURGERY Left 2011   COLONOSCOPY  2010   COSMETIC SURGERY  2012   HERNIA REPAIR  2012   LIPOSUCTION  2012   MM DUCTOGRAM BILAT R/S  2010   due to bloody breast  discharge    Family History  Problem Relation Age of Onset   Hypertension Mother    Hyperlipidemia Mother    Diabetes Mother    Stroke Mother    Heart disease Father        CABG   Hypertension Father    Mental illness Brother        anxiety   Cancer Maternal Grandmother        lung   Stroke Paternal Grandmother    Stroke Paternal Grandfather     Social History   Socioeconomic History   Marital status: Divorced    Spouse name: Not on file   Number of children: Not on file   Years of education: Not on file   Highest education level: Not on file  Occupational History   Not on file  Tobacco Use   Smoking status: Never   Smokeless tobacco: Never  Vaping Use   Vaping Use: Never used  Substance and Sexual Activity   Alcohol use: No   Drug use: No   Sexual activity: Yes    Birth control/protection: Implant  Other Topics Concern   Not on file  Social History Narrative   Not on file   Social Determinants of Health   Financial Resource Strain:  Not on file  Food Insecurity: Not on file  Transportation Needs: Not on file  Physical Activity: Not on file  Stress: Not on file  Social Connections: Not on file  Intimate Partner Violence: Not on file     Current Outpatient Medications:    albuterol (VENTOLIN HFA) 108 (90 Base) MCG/ACT inhaler, Inhale 2 puffs into the lungs every 6 (six) hours as needed for wheezing or shortness of breath., Disp: 18 g, Rfl: 2   citalopram (CELEXA) 40 MG tablet, Take 1 tablet (40 mg total) by mouth daily., Disp: 90 tablet, Rfl: 1   fluticasone (FLONASE) 50 MCG/ACT nasal spray, Place 2 sprays into both nostrils daily., Disp: 16 g, Rfl: 6   levocetirizine (XYZAL) 5 MG tablet, Take 1 tablet (5 mg total) by mouth every evening., Disp: 90 tablet, Rfl: 4   linaclotide (LINZESS) 145 MCG CAPS capsule, Take 1 capsule (145 mcg total) by mouth daily before breakfast., Disp: 90 capsule, Rfl: 1   lisinopril (ZESTRIL) 5 MG tablet, Take 1 tablet (5 mg  total) by mouth daily., Disp: 90 tablet, Rfl: 1   MELATONIN PO, Take 1 Dose by mouth daily., Disp: , Rfl:    pantoprazole (PROTONIX) 40 MG tablet, Take 1 tablet (40 mg total) by mouth daily., Disp: 90 tablet, Rfl: 1   rosuvastatin (CRESTOR) 20 MG tablet, Take 1 tablet (20 mg total) by mouth daily., Disp: 90 tablet, Rfl: 1   valACYclovir (VALTREX) 500 MG tablet, TAKE 1 TABLET BY MOUTH EVERY DAY, Disp: 90 tablet, Rfl: 1   Vitamin D, Ergocalciferol, (DRISDOL) 1.25 MG (50000 UNIT) CAPS capsule, Take 1 capsule (50,000 Units total) by mouth every 7 (seven) days., Disp: 12 capsule, Rfl: 1  Current Facility-Administered Medications:    etonogestrel (NEXPLANON) implant 68 mg, 68 mg, Subdermal, Once, Blayklee Mable B, PA-C     ROS:  Review of Systems  Constitutional:  Negative for fatigue, fever and unexpected weight change.  Respiratory:  Negative for cough, shortness of breath and wheezing.   Cardiovascular:  Negative for chest pain, palpitations and leg swelling.  Gastrointestinal:  Negative for blood in stool, constipation, diarrhea, nausea and vomiting.  Endocrine: Negative for cold intolerance, heat intolerance and polyuria.  Genitourinary:  Negative for dyspareunia, dysuria, flank pain, frequency, genital sores, hematuria, menstrual problem, pelvic pain, urgency, vaginal bleeding, vaginal discharge and vaginal pain.  Musculoskeletal:  Negative for back pain, joint swelling and myalgias.  Skin:  Negative for rash.  Neurological:  Negative for dizziness, syncope, light-headedness, numbness and headaches.  Hematological:  Negative for adenopathy.  Psychiatric/Behavioral:  Negative for agitation, confusion, sleep disturbance and suicidal ideas. The patient is not nervous/anxious.    BREAST: No symptoms   Objective: There were no vitals taken for this visit.   Physical Exam Constitutional:      Appearance: She is well-developed.  Genitourinary:     Vulva normal.     No vaginal  discharge, erythema or tenderness.      Right Adnexa: not tender and no mass present.    Left Adnexa: not tender and no mass present.    No cervical polyp.     Uterus is not enlarged or tender.  Breasts:    Right: No mass, nipple discharge, skin change or tenderness.     Left: No mass, nipple discharge, skin change or tenderness.  Neck:     Thyroid: No thyromegaly.  Cardiovascular:     Rate and Rhythm: Normal rate and regular rhythm.     Heart  sounds: Normal heart sounds. No murmur heard. Pulmonary:     Effort: Pulmonary effort is normal.     Breath sounds: Normal breath sounds.  Abdominal:     Palpations: Abdomen is soft.     Tenderness: There is no abdominal tenderness. There is no guarding.  Musculoskeletal:        General: Normal range of motion.     Cervical back: Normal range of motion.  Neurological:     General: No focal deficit present.     Mental Status: [name] and oriented to person, place, and time.     Cranial Nerves: No cranial nerve deficit.  Skin:    General: Skin is warm and dry.  Psychiatric:        Mood and Affect: Mood normal.        Behavior: Behavior normal.        Thought Content: Thought content normal.        Judgment: Judgment normal.  Vitals reviewed.      No chief complaint on file.    History of Present Illness:  MERCED BROUGHAM is a 52 y.o. that had a nexplanon placed approximately {NUMBERS:20191} {MONTH/YR:310907}  ago. Since that time, she  ***.  There were no vitals taken for this visit.   Nexplanon removal Procedure note - The Nexplanon was noted in the patient's arm and the end was identified. The skin was cleansed with a Betadine solution. A small injection of subcutaneous lidocaine with epinephrine was given over the end of the implant. An incision was made at the end of the implant. The rod was noted in the incision and grasped with a hemostat. It was noted to be intact.  Steri-Strip was placed approximating the incision.  Hemostasis was noted.  Assessment: Nexplanon removal   Plan:   She was told to remove the dressing in 12-24 hours, to keep the incision area dry for 24 hours and to remove the Steristrip in 2-3  days.  Notify us if any signs of tenderness, redness, pain, or fevers develop.   Theoden Mauch B. Tailynn Armetta, PA-C 10/24/2022 8:54 PM   Nexplanon removal Procedure note - The Nexplanon was noted in the patient's arm and the end was identified. The skin was cleansed with a Betadine solution. A small injection of subcutaneous lidocaine with epinephrine was given over the end of the implant. An incision was made at the end of the implant. The rod was noted in the incision and grasped with a hemostat. It was noted to be intact.  Steri-Strip was placed approximating the incision. Hemostasis was noted.  Nexplanon Insertion  Patient given informed consent, signed copy in the chart, time out was performed. Pregnancy test was ***. Appropriate time out taken.  Patient's LEFT/RIGHT *** arm was prepped and draped in the usual sterile fashion. The ruler used to measure and mark insertion area.  Pt was prepped with betadine swab and then injected with *** cc of 2% lidocaine with epinephrine. Nexplanon removed form packaging,  Device confirmed in needle, then inserted full length of needle and withdrawn per handbook instructions.  Pt insertion site covered with steri-strip and a bandage.   Minimal blood loss.  Pt tolerated the procedure well.  Assessment: Encounter for annual routine gynecological examination  Cervical cancer screening  Screening for HPV (human papillomavirus)  Encounter for screening mammogram for malignant neoplasm of breast  Encounter for removal and reinsertion of Nexplanon  Screening for colon cancer   No orders of the defined  types were placed in this encounter.    Plan:   She was told to remove the dressing in 12-24 hours, to keep the incision area dry for 24 hours and to remove the  Steristrip in 2-3  days.  Notify us if any signs of tenderness, redness, pain, or fevers develop.   Goran Olden B. Jalynn Waddell, PA-C 10/24/2022 8:54 PM  Assessment/Plan: Encounter for annual routine gynecological examination  Encounter for surveillance of implantable subdermal contraceptive--BTB with nexplanon. F/u in a month or so if daily bleeding continues. Will add POP. Lighter flow now  Screening for STD (sexually transmitted disease) - Plan: HIV Antibody (routine testing w rflx); Check HIV in 4/21.  Encounter for screening mammogram for malignant neoplasm of breast - Plan: MM 3D SCREEN BREAST BILATERAL; pt to sched mammo  Weight loss counseling, encounter for - Plan: phentermine (ADIPEX-P) 37.5 MG tablet; cont wt watchers and exercise. Phentermine for 2 months. F/u prn.   No orders of the defined types were placed in this encounter.            GYN counsel breast self exam, mammography screening, adequate intake of calcium and vitamin D, diet and exercise     F/U  No follow-ups on file.  Lacrystal Barbe B. Lillyan Hitson, PA-C 10/24/2022 8:52 PM

## 2022-10-25 ENCOUNTER — Other Ambulatory Visit (HOSPITAL_COMMUNITY)
Admission: RE | Admit: 2022-10-25 | Discharge: 2022-10-25 | Disposition: A | Discharge status: Home / Self Care | Payer: No Typology Code available for payment source | Source: Ambulatory Visit | Attending: Obstetrics and Gynecology | Admitting: Obstetrics and Gynecology

## 2022-10-25 ENCOUNTER — Encounter: Payer: Self-pay | Admitting: Obstetrics and Gynecology

## 2022-10-25 ENCOUNTER — Ambulatory Visit (INDEPENDENT_AMBULATORY_CARE_PROVIDER_SITE_OTHER): Payer: Self-pay | Admitting: Obstetrics and Gynecology

## 2022-10-25 VITALS — BP 118/70 | Ht 63.0 in | Wt 222.0 lb

## 2022-10-25 DIAGNOSIS — Z124 Encounter for screening for malignant neoplasm of cervix: Secondary | ICD-10-CM | POA: Insufficient documentation

## 2022-10-25 DIAGNOSIS — Z713 Dietary counseling and surveillance: Secondary | ICD-10-CM

## 2022-10-25 DIAGNOSIS — Z3202 Encounter for pregnancy test, result negative: Secondary | ICD-10-CM | POA: Diagnosis not present

## 2022-10-25 DIAGNOSIS — A6004 Herpesviral vulvovaginitis: Secondary | ICD-10-CM

## 2022-10-25 DIAGNOSIS — Z1211 Encounter for screening for malignant neoplasm of colon: Secondary | ICD-10-CM

## 2022-10-25 DIAGNOSIS — Z3046 Encounter for surveillance of implantable subdermal contraceptive: Secondary | ICD-10-CM

## 2022-10-25 DIAGNOSIS — Z1151 Encounter for screening for human papillomavirus (HPV): Secondary | ICD-10-CM | POA: Insufficient documentation

## 2022-10-25 DIAGNOSIS — Z113 Encounter for screening for infections with a predominantly sexual mode of transmission: Secondary | ICD-10-CM

## 2022-10-25 DIAGNOSIS — Z01419 Encounter for gynecological examination (general) (routine) without abnormal findings: Secondary | ICD-10-CM | POA: Diagnosis not present

## 2022-10-25 DIAGNOSIS — Z1231 Encounter for screening mammogram for malignant neoplasm of breast: Secondary | ICD-10-CM

## 2022-10-25 LAB — POCT URINE PREGNANCY: Preg Test, Ur: NEGATIVE

## 2022-10-25 MED ORDER — ETONOGESTREL 68 MG ~~LOC~~ IMPL
68.0000 mg | DRUG_IMPLANT | Freq: Once | SUBCUTANEOUS | Status: AC
  Administered 2022-10-25: 68 mg via SUBCUTANEOUS

## 2022-10-25 MED ORDER — PHENTERMINE HCL 37.5 MG PO TABS: 37.5000 mg | ORAL_TABLET | Freq: Every day | ORAL | 1 refills | Status: DC

## 2022-10-25 NOTE — Patient Instructions (Addendum)
I value your feedback and you entrusting [place] with your care. If you get a [facility] patient survey, I would appreciate you taking the time to let us know about your experience today. Thank you!  [place] [facility]: [phone]  Remove the dressing in 24 hours,  keep the incision area dry for 24 hours and remove the Steristrip in 2-3  days.  Notify us if any signs of tenderness, redness, pain, or fevers develop.

## 2022-10-26 ENCOUNTER — Encounter: Payer: Self-pay | Admitting: Obstetrics and Gynecology

## 2022-10-26 ENCOUNTER — Telehealth: Payer: Self-pay

## 2022-10-26 LAB — HIV ANTIBODY (ROUTINE TESTING W REFLEX): HIV Screen 4th Generation wRfx: NONREACTIVE

## 2022-10-26 NOTE — Telephone Encounter (Signed)
Called Ms. Damyiah as I received a fax from the after hour nurse line, patient has picked up the medication now.

## 2022-10-29 LAB — CYTOLOGY - PAP
Comment: NEGATIVE
Diagnosis: NEGATIVE
High risk HPV: NEGATIVE

## 2022-11-12 ENCOUNTER — Encounter: Payer: Self-pay | Admitting: Nurse Practitioner

## 2022-11-14 ENCOUNTER — Encounter: Payer: Self-pay | Admitting: Obstetrics and Gynecology

## 2022-12-04 ENCOUNTER — Encounter: Payer: Self-pay | Admitting: Obstetrics and Gynecology

## 2022-12-04 ENCOUNTER — Other Ambulatory Visit: Payer: Self-pay | Admitting: Obstetrics and Gynecology

## 2022-12-04 DIAGNOSIS — Z713 Dietary counseling and surveillance: Secondary | ICD-10-CM

## 2022-12-04 MED ORDER — PHENTERMINE HCL 37.5 MG PO TABS: 37.5000 mg | ORAL_TABLET | Freq: Every day | ORAL | 1 refills | Status: DC

## 2022-12-04 NOTE — Progress Notes (Signed)
Rx RF phentermine for wt loss. Doing well.

## 2023-01-03 ENCOUNTER — Encounter: Payer: Self-pay | Admitting: Nurse Practitioner

## 2023-01-03 ENCOUNTER — Encounter: Payer: Self-pay | Admitting: Obstetrics and Gynecology

## 2023-01-04 NOTE — Telephone Encounter (Signed)
Appt has been made.

## 2023-01-09 NOTE — Progress Notes (Unsigned)
   There were no vitals taken for this visit.   Subjective:    Patient ID: Victoria Shepard, female    DOB: 03-06-1971, 52 y.o.   MRN: [id number]  HPI: Victoria Shepard is a 52 y.o. female  No chief complaint on file.  HYPERTENSION {Blank single:19197::"without","with"} Chronic Kidney Disease Hypertension status: {Blank single:19197::"controlled","uncontrolled","better","worse","exacerbated","stable"}  Satisfied with current treatment? {Blank single:19197::"yes","no"} Duration of hypertension: {Blank single:19197::"chronic","months","years"} BP monitoring frequency:  {Blank single:19197::"not checking","rarely","daily","weekly","monthly","a few times a day","a few times a week","a few times a month"} BP range:  BP medication side effects:  {Blank single:19197::"yes","no"} Medication compliance: {Blank single:19197::"excellent compliance","good compliance","fair compliance","poor compliance"} Previous BP meds:{Blank A999333 (bystolic)","carvedilol","chlorthalidone","clonidine","diltiazem","exforge HCT","HCTZ","irbesartan (avapro)","labetalol","lisinopril","lisinopril-HCTZ","losartan (cozaar)","methyldopa","nifedipine","olmesartan (benicar)","olmesartan-HCTZ","quinapril","ramipril","spironalactone","tekturna","valsartan","valsartan-HCTZ","verapamil"} Aspirin: {Blank single:19197::"yes","no"} Recurrent headaches: {Blank single:19197::"yes","no"} Visual changes: {Blank single:19197::"yes","no"} Palpitations: {Blank single:19197::"yes","no"} Dyspnea: {Blank single:19197::"yes","no"} Chest pain: {Blank single:19197::"yes","no"} Lower extremity edema: {Blank single:19197::"yes","no"} Dizzy/lightheaded: {Blank single:19197::"yes","no"}  Relevant past medical, surgical, family and social history reviewed and updated as indicated. Interim medical history since our last visit reviewed. Allergies  and medications reviewed and updated.  Review of Systems  Per HPI unless specifically indicated above     Objective:    There were no vitals taken for this visit.  Wt Readings from Last 3 Encounters:  10/25/22 222 lb (100.7 kg)  08/14/22 219 lb 9.6 oz (99.6 kg)  07/12/22 218 lb 4.8 oz (99 kg)    Physical Exam  Results for orders placed or performed in visit on 10/25/22  HIV Antibody (routine testing w rflx)  Result Value Ref Range   HIV Screen 4th Generation wRfx Non Reactive Non Reactive  POCT urine pregnancy  Result Value Ref Range   Preg Test, Ur Negative Negative  Cytology - PAP  Result Value Ref Range   High risk HPV Negative    Adequacy      Satisfactory for evaluation; transformation zone component PRESENT.   Diagnosis      - Negative for intraepithelial lesion or malignancy (NILM)   Microorganisms      Fungal organisms present consistent with Candida spp.   Comment Normal Reference Range HPV - Negative       Assessment & Plan:   Problem List Items Addressed This Visit   None    Follow up plan: No follow-ups on file.

## 2023-01-10 ENCOUNTER — Encounter: Payer: Self-pay | Admitting: Nurse Practitioner

## 2023-01-10 ENCOUNTER — Ambulatory Visit (INDEPENDENT_AMBULATORY_CARE_PROVIDER_SITE_OTHER): Payer: No Typology Code available for payment source | Admitting: Nurse Practitioner

## 2023-01-10 VITALS — BP 136/86 | HR 87 | Temp 98.6°F | Wt 225.7 lb

## 2023-01-10 DIAGNOSIS — I10 Essential (primary) hypertension: Secondary | ICD-10-CM | POA: Diagnosis not present

## 2023-01-10 MED ORDER — LISINOPRIL 20 MG PO TABS: 20.0000 mg | ORAL_TABLET | Freq: Every day | ORAL | 0 refills | Status: DC

## 2023-01-10 MED ORDER — WEGOVY 0.25 MG/0.5ML ~~LOC~~ SOAJ: 0.2500 mg | SUBCUTANEOUS | 0 refills | Status: DC

## 2023-01-10 NOTE — Assessment & Plan Note (Signed)
Chronic.  Not well controlled.  Will increase dose to Lisinopril to 20mg  daily.  Return to clinic in 1 months for reevaluation.  Call sooner if concerns arise.

## 2023-01-10 NOTE — Assessment & Plan Note (Signed)
Chronic. Has tried Phentermine.  Will start Wegovy.  Side effects and benefits discussed.  Discussed how to give injection.  Will follow up in 2 weeks to discuss increased dose.  If not covered by insurance patient would like to do self pay so will change to Zepbound.  Follow up in 2 weeks.

## 2023-01-11 ENCOUNTER — Encounter: Payer: Self-pay | Admitting: Nurse Practitioner

## 2023-01-24 ENCOUNTER — Encounter: Payer: Self-pay | Admitting: Nurse Practitioner

## 2023-01-24 ENCOUNTER — Ambulatory Visit: Payer: No Typology Code available for payment source | Admitting: Nurse Practitioner

## 2023-01-25 ENCOUNTER — Encounter: Payer: Self-pay | Admitting: Nurse Practitioner

## 2023-01-25 ENCOUNTER — Ambulatory Visit (INDEPENDENT_AMBULATORY_CARE_PROVIDER_SITE_OTHER): Payer: No Typology Code available for payment source | Admitting: Nurse Practitioner

## 2023-01-25 VITALS — BP 90/70 | HR 66 | Ht 63.0 in | Wt 219.7 lb

## 2023-01-25 DIAGNOSIS — K5909 Other constipation: Secondary | ICD-10-CM

## 2023-01-25 DIAGNOSIS — I10 Essential (primary) hypertension: Secondary | ICD-10-CM | POA: Diagnosis not present

## 2023-01-25 MED ORDER — LUBIPROSTONE 8 MCG PO CAPS: 8.0000 ug | ORAL_CAPSULE | Freq: Two times a day (BID) | ORAL | 1 refills | Status: DC

## 2023-01-25 MED ORDER — WEGOVY 0.5 MG/0.5ML ~~LOC~~ SOAJ: 0.5000 mg | SUBCUTANEOUS | 0 refills | Status: DC

## 2023-01-25 NOTE — Assessment & Plan Note (Signed)
Chronic. Not well controlled.  Has bene using colace.  Has used Linzess in the past which worked well for her.  States insurance no longer covers the medication.  Will try Amitiza.  Side effects and benefits of medication discussed during visit.  Follow up in 1 month.

## 2023-01-25 NOTE — Assessment & Plan Note (Signed)
Chronic. Patient has lost 6lbs since last visit.  Will increase dose of Wegovy to 0.5mg .  Discussed increasing water intake, fiber intake and continue with colace.  Follow up in 1 month.  Will check labs at next visit.

## 2023-01-25 NOTE — Progress Notes (Signed)
BP 90/70   Pulse 66   Ht 5\' 3"  (1.6 m)   Wt 219 lb 11.2 oz (99.7 kg)   SpO2 98%   BMI 38.92 kg/m    Subjective:    Patient ID: Victoria Shepard, female    DOB: 02-05-1971, 52 y.o.   MRN: [id number]  HPI: Victoria Shepard is a 52 y.o. female  Chief Complaint  Patient presents with   Medication Management    F/up   HYPERTENSION without Chronic Kidney Disease Hypertension status: controlled Satisfied with current treatment? yes Duration of hypertension: years BP monitoring frequency:  daily BP range: 119/79 BP medication side effects:  no Medication compliance: excellent compliance Previous BP meds:lisinopril Aspirin: no Recurrent headaches: yes Visual changes: no Palpitations: no Dyspnea: no Chest pain: no Lower extremity edema: no Dizzy/lightheaded: no  WEIGHT GAIN Patient is doing well with QQPYPP.  She has lost 6lbs. She is struggling wit constipation.  She has had constipation since she was 52 years old.  Has been on Linzess in the past but her insurance won't cover it.   Duration: years Previous attempts at weight loss:  yes Complications of obesity: hypertension Peak weight: 225lb Weight loss goal:  Weight loss to date:  Requesting obesity pharmacotherapy: yes Current weight loss supplements/medications: no Previous weight loss supplements/meds: yes- was on phentermine and didn't do well with the medication.    Relevant past medical, surgical, family and social history reviewed and updated as indicated. Interim medical history since our last visit reviewed. Allergies and medications reviewed and updated.  Review of Systems  Constitutional:  Negative for unexpected weight change.  Eyes:  Negative for visual disturbance.  Respiratory:  Negative for cough, chest tightness and shortness of breath.   Cardiovascular:  Negative for chest pain, palpitations and leg swelling.  Neurological:  Negative for dizziness and headaches.    Per HPI unless specifically  indicated above     Objective:    BP 90/70   Pulse 66   Ht 5\' 3"  (1.6 m)   Wt 219 lb 11.2 oz (99.7 kg)   SpO2 98%   BMI 38.92 kg/m   Wt Readings from Last 3 Encounters:  01/25/23 219 lb 11.2 oz (99.7 kg)  01/10/23 225 lb 11.2 oz (102.4 kg)  10/25/22 222 lb (100.7 kg)    Physical Exam Vitals and nursing note reviewed.  Constitutional:      General: She is not in acute distress.    Appearance: Normal appearance. She is obese. She is not ill-appearing, toxic-appearing or diaphoretic.  HENT:     Head: Normocephalic.     Right Ear: External ear normal.     Left Ear: External ear normal.     Nose: Nose normal.     Mouth/Throat:     Mouth: Mucous membranes are moist.     Pharynx: Oropharynx is clear.  Eyes:     General:        Right eye: No discharge.        Left eye: No discharge.     Extraocular Movements: Extraocular movements intact.     Conjunctiva/sclera: Conjunctivae normal.     Pupils: Pupils are equal, round, and reactive to light.  Cardiovascular:     Rate and Rhythm: Normal rate and regular rhythm.     Heart sounds: No murmur heard. Pulmonary:     Effort: Pulmonary effort is normal. No respiratory distress.     Breath sounds: Normal breath sounds. No wheezing or rales.  Musculoskeletal:     Cervical back: Normal range of motion and neck supple.  Skin:    General: Skin is warm and dry.     Capillary Refill: Capillary refill takes less than 2 seconds.  Neurological:     General: No focal deficit present.     Mental Status: [name] and oriented to person, place, and time. Mental status is at baseline.  Psychiatric:        Mood and Affect: Mood normal.        Behavior: Behavior normal.        Thought Content: Thought content normal.        Judgment: Judgment normal.     Results for orders placed or performed in visit on 10/25/22  HIV Antibody (routine testing w rflx)  Result Value Ref Range   HIV Screen 4th Generation wRfx Non Reactive Non Reactive   POCT urine pregnancy  Result Value Ref Range   Preg Test, Ur Negative Negative  Cytology - PAP  Result Value Ref Range   High risk HPV Negative    Adequacy      Satisfactory for evaluation; transformation zone component PRESENT.   Diagnosis      - Negative for intraepithelial lesion or malignancy (NILM)   Microorganisms      Fungal organisms present consistent with Candida spp.   Comment Normal Reference Range HPV - Negative       Assessment & Plan:   Problem List Items Addressed This Visit       Cardiovascular and Mediastinum   Hypertension    Chronic.  Controlled.  Continue with current medication regimen on Lisinopril.  Will check labs at 1 visit.  Return to clinic in 1 months for reevaluation.  Call sooner if concerns arise.          Digestive   Chronic constipation - Primary    Chronic. Not well controlled.  Has bene using colace.  Has used Linzess in the past which worked well for her.  States insurance no longer covers the medication.  Will try Amitiza.  Side effects and benefits of medication discussed during visit.  Follow up in 1 month.         Other   Morbid obesity    Chronic. Patient has lost 6lbs since last visit.  Will increase dose of Wegovy to 0.5mg .  Discussed increasing water intake, fiber intake and continue with colace.  Follow up in 1 month.  Will check labs at next visit.      Relevant Medications   Semaglutide-Weight Management (WEGOVY) 0.5 MG/0.5ML SOAJ     Follow up plan: Return in about 1 month (around 02/24/2023) for Physical and Fasting labs.

## 2023-01-25 NOTE — Assessment & Plan Note (Signed)
Chronic.  Controlled.  Continue with current medication regimen on Lisinopril.  Will check labs at 1 visit.  Return to clinic in 1 months for reevaluation.  Call sooner if concerns arise.

## 2023-02-06 ENCOUNTER — Ambulatory Visit: Payer: No Typology Code available for payment source | Admitting: Nurse Practitioner

## 2023-02-11 ENCOUNTER — Encounter: Payer: Self-pay | Admitting: Nurse Practitioner

## 2023-02-12 ENCOUNTER — Encounter: Payer: Self-pay | Admitting: Nurse Practitioner

## 2023-02-12 ENCOUNTER — Telehealth (INDEPENDENT_AMBULATORY_CARE_PROVIDER_SITE_OTHER): Payer: No Typology Code available for payment source | Admitting: Nurse Practitioner

## 2023-02-12 VITALS — BP 108/76 | HR 89

## 2023-02-12 DIAGNOSIS — F419 Anxiety disorder, unspecified: Secondary | ICD-10-CM | POA: Diagnosis not present

## 2023-02-12 MED ORDER — CLONAZEPAM 0.5 MG PO TABS: 0.5000 mg | ORAL_TABLET | Freq: Every day | ORAL | 0 refills | Status: DC

## 2023-02-12 NOTE — Progress Notes (Signed)
BP 108/76   Pulse 89    Subjective:    Patient ID: Victoria Shepard, female    DOB: [birth year], 52 y.o.   MRN: [id number]  HPI: Victoria Shepard is a 52 y.o. female  Chief Complaint  Patient presents with   Anxiety    Pt states he sister was recently admitted to hospice and she has been having a lot of anxiety and panic attacks having to deal with this.    ANXIETY/STRESS Patient's sister is in hospice and she has been really struggling with the loss.  She is having panic attacks. Duration:worse Anxious mood: yes  Excessive worrying: yes Irritability: no  Sweating: no Nausea: no Palpitations:no Hyperventilation: yes Panic attacks: yes Agoraphobia: no  Obscessions/compulsions: no Depressed mood: yes    02/12/2023   11:28 AM 01/25/2023    9:36 AM 01/10/2023    9:43 AM 08/14/2022   11:05 AM 07/12/2022    2:55 PM  Depression screen PHQ 2/9  Decreased Interest 0 0 0 0 0  Down, Depressed, Hopeless 1 0 0 0 0  PHQ - 2 Score 1 0 0 0 0  Altered sleeping 2 0 0 0 0  Tired, decreased energy 1 0 0 0 1  Change in appetite 0 0 1 0 2  Feeling bad or failure about yourself  0 0 0 0 0  Trouble concentrating 3 0 0 0 0  Moving slowly or fidgety/restless 0 0 0 0 0  Suicidal thoughts 0 0 0 0 0  PHQ-9 Score 7 0 1 0 3  Difficult doing work/chores Somewhat difficult Not difficult at all Not difficult at all Not difficult at all Not difficult at all   Anhedonia: no Weight changes: no Insomnia: no  NA   Hypersomnia: no Fatigue/loss of energy: no Feelings of worthlessness: no Feelings of guilt: no Impaired concentration/indecisiveness: no Suicidal ideations: no  Crying spells: no Recent Stressors/Life Changes: no   Relationship problems: yes   Family stress: yes     Financial stress: no    Job stress: no    Recent death/loss: yes  Relevant past medical, surgical, family and social history reviewed and updated as indicated. Interim medical history since our last visit  reviewed. Allergies and medications reviewed and updated.  Review of Systems  Psychiatric/Behavioral:  Negative for dysphoric mood. The patient is nervous/anxious.     Per HPI unless specifically indicated above     Objective:    BP 108/76   Pulse 89   Wt Readings from Last 3 Encounters:  01/25/23 219 lb 11.2 oz (99.7 kg)  01/10/23 225 lb 11.2 oz (102.4 kg)  10/25/22 222 lb (100.7 kg)    Physical Exam Vitals and nursing note reviewed.  HENT:     Head: Normocephalic.     Right Ear: Hearing normal.     Left Ear: Hearing normal.     Nose: Nose normal.  Eyes:     Pupils: Pupils are equal, round, and reactive to light.  Pulmonary:     Effort: Pulmonary effort is normal. No respiratory distress.  Neurological:     Mental Status: [name].  Psychiatric:        Mood and Affect: Mood normal.        Behavior: Behavior normal.        Thought Content: Thought content normal.        Judgment: Judgment normal.     Results for orders placed or performed in visit on 10/25/22  HIV Antibody (routine testing w rflx)  Result Value Ref Range   HIV Screen 4th Generation wRfx Non Reactive Non Reactive  POCT urine pregnancy  Result Value Ref Range   Preg Test, Ur Negative Negative  Cytology - PAP  Result Value Ref Range   High risk HPV Negative    Adequacy      Satisfactory for evaluation; transformation zone component PRESENT.   Diagnosis      - Negative for intraepithelial lesion or malignancy (NILM)   Microorganisms      Fungal organisms present consistent with Candida spp.   Comment Normal Reference Range HPV - Negative       Assessment & Plan:   Problem List Items Addressed This Visit       Other   Anxiety - Primary    Exacerbated at this time due to stress with her sister in hospice.  She had a bad reaction to Xanax- hallucinations.  She has done well with klonopin in the past.  Discussed side effects and benefits of medication.  Discussed with patient that the  Klonopin isn't a long term solution but okay to take in the short term.  Keep follow up appointment for May.         Follow up plan: No follow-ups on file.   This visit was completed via MyChart due to the restrictions of the COVID-19 pandemic. All issues as above were discussed and addressed. Physical exam was done as above through visual confirmation on MyChart. If it was felt that the patient should be evaluated in the office, they were directed there. The patient verbally consented to this visit. Location of the patient: Home Location of the provider: Office Those involved with this call:  Provider: Larae Grooms, NP CMA: Wilhemena Durie, CMA Front Desk/Registration: Servando Snare This encounter was conducted via video.  I spent 20 dedicated to the care of this patient on the date of this encounter to include previsit review of symptoms, plan of care and follow up, face to face time with the patient, and post visit ordering of testing.

## 2023-02-12 NOTE — Assessment & Plan Note (Signed)
Exacerbated at this time due to stress with her sister in hospice.  She had a bad reaction to Xanax- hallucinations.  She has done well with klonopin in the past.  Discussed side effects and benefits of medication.  Discussed with patient that the Klonopin isn't a long term solution but okay to take in the short term.  Keep follow up appointment for May.

## 2023-02-16 ENCOUNTER — Other Ambulatory Visit: Payer: Self-pay | Admitting: Nurse Practitioner

## 2023-02-18 NOTE — Telephone Encounter (Signed)
Requested Prescriptions  Pending Prescriptions Disp Refills   lubiprostone (AMITIZA) 8 MCG capsule [Pharmacy Med Name: LUBIPROSTONE 8 MCG CAPSULE] 180 capsule 1    Sig: TAKE 1 CAPSULE (8 MCG TOTAL) BY MOUTH 2 (TWO) TIMES DAILY WITH A MEAL.     Gastroenterology: Irritable Bowel Syndrome - lubiprostone Passed - 02/16/2023  1:33 PM      Passed - AST in normal range and within 360 days    AST  Date Value Ref Range Status  07/12/2022 20 0 - 40 IU/L Final   SGOT(AST)  Date Value Ref Range Status  03/21/2013 29 15 - 37 Unit/L Final   AST (SGOT) Piccolo, Waived  Date Value Ref Range Status  04/29/2015 36 11 - 38 U/L Final         Passed - ALT in normal range and within 360 days    ALT  Date Value Ref Range Status  07/12/2022 23 0 - 32 IU/L Final   SGPT (ALT)  Date Value Ref Range Status  03/21/2013 55 12 - 78 U/L Final   ALT (SGPT) Piccolo, Waived  Date Value Ref Range Status  04/29/2015 37 10 - 47 U/L Final         Passed - Valid encounter within last 12 months    Recent Outpatient Visits           6 days ago Anxiety   [place] [facility] Larae Grooms, NP   3 weeks ago Chronic constipation   [place] [facility] Larae Grooms, NP   1 month ago Primary hypertension   [place] [facility] Larae Grooms, NP   6 months ago Left elbow pain   [facility] Larae Grooms, NP   7 months ago Recurrent major depressive disorder, in full remission [facility])   [place] [facility] Larae Grooms, NP       Future Appointments             In 1 week Larae Grooms, NP [place] [facility]   In 1 week Deirdre Evener, MD [facility]

## 2023-02-25 ENCOUNTER — Encounter: Payer: Self-pay | Admitting: Nurse Practitioner

## 2023-02-25 ENCOUNTER — Ambulatory Visit (INDEPENDENT_AMBULATORY_CARE_PROVIDER_SITE_OTHER): Payer: No Typology Code available for payment source | Admitting: Nurse Practitioner

## 2023-02-25 VITALS — BP 119/80 | HR 106 | Temp 98.1°F | Ht 63.0 in | Wt 219.8 lb

## 2023-02-25 DIAGNOSIS — Z1159 Encounter for screening for other viral diseases: Secondary | ICD-10-CM

## 2023-02-25 DIAGNOSIS — F3342 Major depressive disorder, recurrent, in full remission: Secondary | ICD-10-CM | POA: Diagnosis not present

## 2023-02-25 DIAGNOSIS — Z1211 Encounter for screening for malignant neoplasm of colon: Secondary | ICD-10-CM

## 2023-02-25 DIAGNOSIS — E7800 Pure hypercholesterolemia, unspecified: Secondary | ICD-10-CM

## 2023-02-25 DIAGNOSIS — I10 Essential (primary) hypertension: Secondary | ICD-10-CM

## 2023-02-25 DIAGNOSIS — F419 Anxiety disorder, unspecified: Secondary | ICD-10-CM

## 2023-02-25 DIAGNOSIS — Z0000 Encounter for general adult medical examination without abnormal findings: Secondary | ICD-10-CM | POA: Diagnosis not present

## 2023-02-25 DIAGNOSIS — Z114 Encounter for screening for human immunodeficiency virus [HIV]: Secondary | ICD-10-CM

## 2023-02-25 DIAGNOSIS — R7303 Prediabetes: Secondary | ICD-10-CM

## 2023-02-25 LAB — URINALYSIS, ROUTINE W REFLEX MICROSCOPIC
Bilirubin, UA: NEGATIVE
Glucose, UA: NEGATIVE
Ketones, UA: NEGATIVE
Leukocytes,UA: NEGATIVE
Nitrite, UA: NEGATIVE
RBC, UA: NEGATIVE
Specific Gravity, UA: >1.03 — ABNORMAL HIGH (ref 1.005–1.030)
Urobilinogen, Ur: 1 mg/dL (ref 0.2–1.0)
pH, UA: 5.5 (ref 5.0–7.5)

## 2023-02-25 MED ORDER — WEGOVY 1 MG/0.5ML ~~LOC~~ SOAJ: 1.0000 mg | SUBCUTANEOUS | 0 refills | Status: DC

## 2023-02-25 NOTE — Progress Notes (Signed)
BP 119/80 Comment: Home blood pressure reading  Pulse (!) 106   Temp 98.1 F (36.7 C) (Oral)   Ht 5\' 3"  (1.6 m)   Wt 219 lb 12.8 oz (99.7 kg)   SpO2 97%   BMI 38.94 kg/m    Subjective:    Patient ID: Victoria Shepard, female    DOB: 1971-01-02, 52 y.o.   MRN: [id number]  HPI: Victoria Shepard is a 52 y.o. female presenting on 02/25/2023 for comprehensive medical examination. Current medical complaints include: sister recently passed away.  She currently lives with: Menopausal Symptoms: no  HYPERTENSION / HYPERLIPIDEMIA Satisfied with current treatment? yes Duration of hypertension: years BP monitoring frequency: daily BP range: <120/80 BP medication side effects: no Past BP meds: lisinopril Duration of hyperlipidemia: years Cholesterol medication side effects: no Cholesterol supplements: none Past cholesterol medications: rosuvastatin (crestor) Medication compliance: excellent compliance Aspirin: no Recent stressors: no Recurrent headaches: no Visual changes: no Palpitations: no Dyspnea: no Chest pain: no Lower extremity edema: no Dizzy/lightheaded: no  WEIGHT GAIN Has lost 6 lbs since starting the medication.  Has not lost anymore weight.  Does have constipation.   Duration: years Previous attempts at weight loss: yes Complications of obesity: prediabetes, HTN, HLD Peak weight: 225lb Weight loss goal: 165lb Weight loss to date:  Requesting obesity pharmacotherapy: yes Current weight loss supplements/medications: yes Previous weight loss supplements/meds: yes Calories:   MOOD Patient's sister recently passed away.  She is having a hard time with the passing of her sister.  She has had a lot of family stress which is making things difficult for her.     Depression Screen done today and results listed below:     02/25/2023    9:04 AM 02/12/2023   11:28 AM 01/25/2023    9:36 AM 01/10/2023    9:43 AM 08/14/2022   11:05 AM  Depression screen PHQ 2/9   Decreased Interest 0 0 0 0 0  Down, Depressed, Hopeless 1 1 0 0 0  PHQ - 2 Score 1 1 0 0 0  Altered sleeping 0 2 0 0 0  Tired, decreased energy 0 1 0 0 0  Change in appetite 0 0 0 1 0  Feeling bad or failure about yourself  0 0 0 0 0  Trouble concentrating 0 3 0 0 0  Moving slowly or fidgety/restless 0 0 0 0 0  Suicidal thoughts 0 0 0 0 0  PHQ-9 Score 1 7 0 1 0  Difficult doing work/chores Not difficult at all Somewhat difficult Not difficult at all Not difficult at all Not difficult at all    The patient does not have a history of falls. I didcomplete a risk assessment for falls. A plan of care for falls was documented.   Past Medical History:  Past Medical History:  Diagnosis Date   Anxiety    Depression    Heart murmur    Herpes    History of abnormal mammogram 2010   History of cellulitis    belly button   Hypercholesteremia    Hypertension    Nexplanon in place    placed 10/11/14, remove 10/11/17   OCD (obsessive compulsive disorder)     Surgical History:  Past Surgical History:  Procedure Laterality Date   BIOPSY BREAST  04/2010   was normal   BREAST EXCISIONAL BIOPSY Left 2011   benign   BREAST SURGERY Left 2011   COLONOSCOPY  2010   COSMETIC SURGERY  2012  HERNIA REPAIR  2012   LIPOSUCTION  2012   MM DUCTOGRAM BILAT R/S  2010   due to bloody breast discharge    Medications:  Current Outpatient Medications on File Prior to Visit  Medication Sig   citalopram (CELEXA) 40 MG tablet Take 1 tablet (40 mg total) by mouth daily.   clonazePAM (KLONOPIN) 0.5 MG tablet Take 1 tablet (0.5 mg total) by mouth daily.   etonogestrel (NEXPLANON) 68 MG IMPL implant 1 each by Subdermal route once.   levocetirizine (XYZAL) 5 MG tablet Take 1 tablet (5 mg total) by mouth every evening.   lisinopril (ZESTRIL) 20 MG tablet Take 1 tablet (20 mg total) by mouth daily.   lubiprostone (AMITIZA) 8 MCG capsule Take 1 capsule (8 mcg total) by mouth 2 (two) times daily with a  meal.   pantoprazole (PROTONIX) 40 MG tablet Take 1 tablet (40 mg total) by mouth daily.   rosuvastatin (CRESTOR) 20 MG tablet Take 1 tablet (20 mg total) by mouth daily.   Current Facility-Administered Medications on File Prior to Visit  Medication   etonogestrel (NEXPLANON) implant 68 mg    Allergies:  Allergies  Allergen Reactions   Amoxicillin Anaphylaxis   Penicillins Anaphylaxis   Oseltamivir Hives   Bactrim [Sulfamethoxazole-Trimethoprim] Itching and Swelling   Erythromycin Other (See Comments)    GI Upset   Keflex [Cephalexin] Other (See Comments)    Yeast infection    Social History:  Social History   Socioeconomic History   Marital status: Divorced    Spouse name: Not on file   Number of children: Not on file   Years of education: Not on file   Highest education level: Not on file  Occupational History   Not on file  Tobacco Use   Smoking status: Never   Smokeless tobacco: Never  Vaping Use   Vaping Use: Never used  Substance and Sexual Activity   Alcohol use: No   Drug use: No   Sexual activity: Yes    Birth control/protection: Implant  Other Topics Concern   Not on file  Social History Narrative   Not on file   Social Determinants of Health   Financial Resource Strain: Not on file  Food Insecurity: Not on file  Transportation Needs: Not on file  Physical Activity: Not on file  Stress: Not on file  Social Connections: Not on file  Intimate Partner Violence: Not on file   Social History   Tobacco Use  Smoking Status Never  Smokeless Tobacco Never   Social History   Substance and Sexual Activity  Alcohol Use No    Family History:  Family History  Problem Relation Age of Onset   Hypertension Mother    Hyperlipidemia Mother    Diabetes Mother    Stroke Mother    Heart disease Father        CABG   Hypertension Father    Mental illness Brother        anxiety   Breast cancer Maternal Aunt        twice 30s and 21s   Skin cancer  Paternal Aunt        15s   Pancreatic cancer Paternal Uncle        65s   Bone cancer Paternal Uncle        11s   Cancer Maternal Grandmother        lung   Stroke Paternal Grandmother    Stroke Paternal Grandfather  Past medical history, surgical history, medications, allergies, family history and social history reviewed with patient today and changes made to appropriate areas of the chart.   Review of Systems  Eyes:  Negative for blurred vision and double vision.  Respiratory:  Negative for shortness of breath.   Cardiovascular:  Negative for chest pain, palpitations and leg swelling.  Neurological:  Negative for dizziness and headaches.  Psychiatric/Behavioral:  Positive for depression. Negative for suicidal ideas. The patient is nervous/anxious.    All other ROS negative except what is listed above and in the HPI.      Objective:    BP 119/80 Comment: Home blood pressure reading  Pulse (!) 106   Temp 98.1 F (36.7 C) (Oral)   Ht 5\' 3"  (1.6 m)   Wt 219 lb 12.8 oz (99.7 kg)   SpO2 97%   BMI 38.94 kg/m   Wt Readings from Last 3 Encounters:  02/25/23 219 lb 12.8 oz (99.7 kg)  01/25/23 219 lb 11.2 oz (99.7 kg)  01/10/23 225 lb 11.2 oz (102.4 kg)    Physical Exam Vitals and nursing note reviewed.  Constitutional:      General: She is awake. She is not in acute distress.    Appearance: Normal appearance. She is well-developed. She is obese. She is not ill-appearing.  HENT:     Head: Normocephalic and atraumatic.     Right Ear: Hearing, tympanic membrane, ear canal and external ear normal. No drainage.     Left Ear: Hearing, tympanic membrane, ear canal and external ear normal. No drainage.     Nose: Nose normal.     Right Sinus: No maxillary sinus tenderness or frontal sinus tenderness.     Left Sinus: No maxillary sinus tenderness or frontal sinus tenderness.     Mouth/Throat:     Mouth: Mucous membranes are moist.     Pharynx: Oropharynx is clear. Uvula midline.  No pharyngeal swelling, oropharyngeal exudate or posterior oropharyngeal erythema.  Eyes:     General: Lids are normal.        Right eye: No discharge.        Left eye: No discharge.     Extraocular Movements: Extraocular movements intact.     Conjunctiva/sclera: Conjunctivae normal.     Pupils: Pupils are equal, round, and reactive to light.     Visual Fields: Right eye visual fields normal and left eye visual fields normal.  Neck:     Thyroid: No thyromegaly.     Vascular: No carotid bruit.     Trachea: Trachea normal.  Cardiovascular:     Rate and Rhythm: Normal rate and regular rhythm.     Heart sounds: Normal heart sounds. No murmur heard.    No gallop.  Pulmonary:     Effort: Pulmonary effort is normal. No accessory muscle usage or respiratory distress.     Breath sounds: Normal breath sounds.  Chest:  Breasts:    Right: Normal.     Left: Normal.  Abdominal:     General: Bowel sounds are normal.     Palpations: Abdomen is soft. There is no hepatomegaly or splenomegaly.     Tenderness: There is no abdominal tenderness.  Musculoskeletal:        General: Normal range of motion.     Cervical back: Normal range of motion and neck supple.     Right lower leg: No edema.     Left lower leg: No edema.  Lymphadenopathy:  Head:     Right side of head: No submental, submandibular, tonsillar, preauricular or posterior auricular adenopathy.     Left side of head: No submental, submandibular, tonsillar, preauricular or posterior auricular adenopathy.     Cervical: No cervical adenopathy.     Upper Body:     Right upper body: No supraclavicular, axillary or pectoral adenopathy.     Left upper body: No supraclavicular, axillary or pectoral adenopathy.  Skin:    General: Skin is warm and dry.     Capillary Refill: Capillary refill takes less than 2 seconds.     Findings: No rash.  Neurological:     Mental Status: [name] and oriented to person, place, and time.     Gait:  Gait is intact.  Psychiatric:        Attention and Perception: Attention normal.        Mood and Affect: Mood normal. Affect is tearful.        Speech: Speech normal.        Behavior: Behavior normal. Behavior is cooperative.        Thought Content: Thought content normal.        Judgment: Judgment normal.     Results for orders placed or performed in visit on 10/25/22  HIV Antibody (routine testing w rflx)  Result Value Ref Range   HIV Screen 4th Generation wRfx Non Reactive Non Reactive  POCT urine pregnancy  Result Value Ref Range   Preg Test, Ur Negative Negative  Cytology - PAP  Result Value Ref Range   High risk HPV Negative    Adequacy      Satisfactory for evaluation; transformation zone component PRESENT.   Diagnosis      - Negative for intraepithelial lesion or malignancy (NILM)   Microorganisms      Fungal organisms present consistent with Candida spp.   Comment Normal Reference Range HPV - Negative       Assessment & Plan:   Problem List Items Addressed This Visit       Cardiovascular and Mediastinum   Hypertension    Chronic.  Controlled.  Continue with current medication regimen on Lisinopril.  Has been checking blood pressure at home and well controlled with the Lisinopril.  Continue to check blood pressures at home.  Follow up in 1 month.  Labs ordered today. Call sooner if concerns arise.          Other   Anxiety    Chronic.  Exacerbated due to recent loss.  Continue with Celexa and PRN Clonazepam.  Follow up in 1 month.  Can consider adding Wellbutrin.        Depression    Chronic.  Exacerbated due to recent loss.  Continue with Celexa and PRN Clonazepam.  Follow up in 1 month.  Can consider adding Wellbutrin.        High cholesterol    Chronic. Continue with Crestor 20mg .  Labs ordered today.  Will make recommendations based on lab results.       Relevant Orders   Lipid panel   Morbid obesity (HCC)    Chronic. Patient has lost 6lbs since  starting Wegovy.  Will increase dose of Wegovy to 1mg .  Discussed increasing water intake, fiber intake and continue with colace.  Follow up in 1 month.  Labs ordered at visit today.        Relevant Medications   Semaglutide-Weight Management (WEGOVY) 1 MG/0.5ML SOAJ   Prediabetes  Labs ordered at visit today.  Will make recommendations based on lab results.        Relevant Orders   HgB A1c   Other Visit Diagnoses     Annual physical exam    -  Primary   Relevant Orders   CBC with Differential/Platelet   Comprehensive metabolic panel   Lipid panel   TSH   Urinalysis, Routine w reflex microscopic   Screening for HIV (human immunodeficiency virus)       Relevant Orders   HIV Antibody (routine testing w rflx)   Encounter for hepatitis C screening test for low risk patient       Relevant Orders   Hepatitis C Antibody   Screening for colon cancer       Relevant Orders   Cologuard        Follow up plan: No follow-ups on file.   LABORATORY TESTING:  - Pap smear: up to date  IMMUNIZATIONS:   - Tdap: Tetanus vaccination status reviewed: last tetanus booster within 10 years. - Influenza: Postponed to flu season - Pneumovax: Not applicable - Prevnar: Not applicable - COVID: Not applicable - HPV: Not applicable - Shingrix vaccine:  Will get at next visit  SCREENING: -Mammogram: Ordered today  - Colonoscopy: Ordered today  - Bone Density: Not applicable  -Hearing Test: Not applicable  -Spirometry: Not applicable   PATIENT COUNSELING:   Advised to take 1 mg of folate supplement per day if capable of pregnancy.   Sexuality: Discussed sexually transmitted diseases, partner selection, use of condoms, avoidance of unintended pregnancy  and contraceptive alternatives.   Advised to avoid cigarette smoking.  I discussed with the patient that most people either abstain from alcohol or drink within safe limits (<=14/week and <=4 drinks/occasion for males, <=7/weeks and <=  3 drinks/occasion for females) and that the risk for alcohol disorders and other health effects rises proportionally with the number of drinks per week and how often a drinker exceeds daily limits.  Discussed cessation/primary prevention of drug use and availability of treatment for abuse.   Diet: Encouraged to adjust caloric intake to maintain  or achieve ideal body weight, to reduce intake of dietary saturated fat and total fat, to limit sodium intake by avoiding high sodium foods and not adding table salt, and to maintain adequate dietary potassium and calcium preferably from fresh fruits, vegetables, and low-fat dairy products.    stressed the importance of regular exercise  Injury prevention: Discussed safety belts, safety helmets, smoke detector, smoking near bedding or upholstery.   Dental health: Discussed importance of regular tooth brushing, flossing, and dental visits.    NEXT PREVENTATIVE PHYSICAL DUE IN 1 YEAR. No follow-ups on file.

## 2023-02-25 NOTE — Assessment & Plan Note (Signed)
Chronic.  Exacerbated due to recent loss.  Continue with Celexa and PRN Clonazepam.  Follow up in 1 month.  Can consider adding Wellbutrin.   

## 2023-02-25 NOTE — Assessment & Plan Note (Signed)
Chronic.  Exacerbated due to recent loss.  Continue with Celexa and PRN Clonazepam.  Follow up in 1 month.  Can consider adding Wellbutrin.

## 2023-02-25 NOTE — Assessment & Plan Note (Signed)
Chronic.  Controlled.  Continue with current medication regimen on Lisinopril.  Has been checking blood pressure at home and well controlled with the Lisinopril.  Continue to check blood pressures at home.  Follow up in 1 month.  Labs ordered today. Call sooner if concerns arise.

## 2023-02-25 NOTE — Assessment & Plan Note (Signed)
Chronic. Continue with Crestor 20mg .  Labs ordered today.  Will make recommendations based on lab results.

## 2023-02-25 NOTE — Assessment & Plan Note (Signed)
Chronic. Patient has lost 6lbs since starting Wegovy.  Will increase dose of Wegovy to 1mg .  Discussed increasing water intake, fiber intake and continue with colace.  Follow up in 1 month.  Labs ordered at visit today.

## 2023-02-25 NOTE — Assessment & Plan Note (Signed)
Labs ordered at visit today.  Will make recommendations based on lab results.   

## 2023-02-26 ENCOUNTER — Encounter: Payer: Self-pay | Admitting: Nurse Practitioner

## 2023-02-26 LAB — CBC WITH DIFFERENTIAL/PLATELET
Basophils Absolute: 0.1 10*3/uL (ref 0.0–0.2)
Basos: 1 %
EOS (ABSOLUTE): 0.2 10*3/uL (ref 0.0–0.4)
Eos: 2 %
Hematocrit: 46 % (ref 34.0–46.6)
Hemoglobin: 15 g/dL (ref 11.1–15.9)
Immature Grans (Abs): 0 10*3/uL (ref 0.0–0.1)
Immature Granulocytes: 0 %
Lymphocytes Absolute: 1.8 10*3/uL (ref 0.7–3.1)
Lymphs: 24 %
MCH: 29.5 pg (ref 26.6–33.0)
MCHC: 32.6 g/dL (ref 31.5–35.7)
MCV: 91 fL (ref 79–97)
Monocytes Absolute: 0.6 10*3/uL (ref 0.1–0.9)
Monocytes: 8 %
Neutrophils Absolute: 5.1 10*3/uL (ref 1.4–7.0)
Neutrophils: 65 %
Platelets: 381 10*3/uL (ref 150–450)
RBC: 5.08 x10E6/uL (ref 3.77–5.28)
RDW: 12.2 % (ref 11.7–15.4)
WBC: 7.7 10*3/uL (ref 3.4–10.8)

## 2023-02-26 LAB — COMPREHENSIVE METABOLIC PANEL
ALT: 27 IU/L (ref 0–32)
AST: 24 IU/L (ref 0–40)
Albumin/Globulin Ratio: 1.6 (ref 1.2–2.2)
Albumin: 4.5 g/dL (ref 3.9–4.9)
Alkaline Phosphatase: 73 IU/L (ref 44–121)
BUN/Creatinine Ratio: 8 — ABNORMAL LOW (ref 9–23)
BUN: 7 mg/dL (ref 6–24)
Bilirubin Total: 1.5 mg/dL — ABNORMAL HIGH (ref 0.0–1.2)
CO2: 21 mmol/L (ref 20–29)
Calcium: 9.3 mg/dL (ref 8.7–10.2)
Chloride: 104 mmol/L (ref 96–106)
Creatinine, Ser: 0.85 mg/dL (ref 0.57–1.00)
Globulin, Total: 2.9 g/dL (ref 1.5–4.5)
Glucose: 104 mg/dL — ABNORMAL HIGH (ref 70–99)
Potassium: 4.4 mmol/L (ref 3.5–5.2)
Sodium: 141 mmol/L (ref 134–144)
Total Protein: 7.4 g/dL (ref 6.0–8.5)
eGFR: 83 mL/min/{1.73_m2} (ref >59)

## 2023-02-26 LAB — LIPID PANEL
Chol/HDL Ratio: 3.2 ratio (ref 0.0–4.4)
Cholesterol, Total: 125 mg/dL (ref 100–199)
HDL: 39 mg/dL — ABNORMAL LOW (ref >39)
LDL Chol Calc (NIH): 67 mg/dL (ref 0–99)
Triglycerides: 101 mg/dL (ref 0–149)
VLDL Cholesterol Cal: 19 mg/dL (ref 5–40)

## 2023-02-26 LAB — HEMOGLOBIN A1C
Est. average glucose Bld gHb Est-mCnc: 126 mg/dL
Hgb A1c MFr Bld: 6 % — ABNORMAL HIGH (ref 4.8–5.6)

## 2023-02-26 LAB — HEPATITIS C ANTIBODY: Hep C Virus Ab: NONREACTIVE

## 2023-02-26 LAB — HIV ANTIBODY (ROUTINE TESTING W REFLEX): HIV Screen 4th Generation wRfx: NONREACTIVE

## 2023-02-26 LAB — TSH: TSH: 1.77 u[IU]/mL (ref 0.450–4.500)

## 2023-02-26 NOTE — Progress Notes (Signed)
Hi Victoria Shepard. It was nice to see you yesterday.  Your lab work looks good.  Your A1c is in the prediabetic range still at 6.0.  I believe this will improve as you continue on your weight loss journey.  No concerns at this time. Continue with your current medication regimen.  Follow up as discussed.  Please let me know if you have any questions.

## 2023-02-27 ENCOUNTER — Encounter: Payer: Self-pay | Admitting: Nurse Practitioner

## 2023-02-27 ENCOUNTER — Ambulatory Visit (INDEPENDENT_AMBULATORY_CARE_PROVIDER_SITE_OTHER): Payer: No Typology Code available for payment source | Admitting: Dermatology

## 2023-02-27 ENCOUNTER — Encounter: Payer: Self-pay | Admitting: Dermatology

## 2023-02-27 VITALS — BP 139/80 | HR 78

## 2023-02-27 DIAGNOSIS — L918 Other hypertrophic disorders of the skin: Secondary | ICD-10-CM

## 2023-02-27 DIAGNOSIS — L732 Hidradenitis suppurativa: Secondary | ICD-10-CM | POA: Diagnosis not present

## 2023-02-27 DIAGNOSIS — D229 Melanocytic nevi, unspecified: Secondary | ICD-10-CM

## 2023-02-27 DIAGNOSIS — Z7189 Other specified counseling: Secondary | ICD-10-CM

## 2023-02-27 DIAGNOSIS — Z79899 Other long term (current) drug therapy: Secondary | ICD-10-CM

## 2023-02-27 DIAGNOSIS — D2339 Other benign neoplasm of skin of other parts of face: Secondary | ICD-10-CM | POA: Diagnosis not present

## 2023-02-27 MED ORDER — DOXYCYCLINE HYCLATE 50 MG PO CAPS: ORAL_CAPSULE | ORAL | 1 refills | Status: DC

## 2023-02-27 NOTE — Patient Instructions (Addendum)
Start Doxycycline 50 mg once daily with dinner. #90, 1 Rf  Doxycycline should be taken with food to prevent nausea. Do not lay down for 30 minutes after taking. Be cautious with sun exposure and use good sun protection while on this medication. Pregnant women should not take this medication.   Cryotherapy Aftercare  Wash gently with soap and water everyday.   Apply Vaseline Jelly daily until healed.   Hidradenitis Suppurativa is a chronic; persistent; non-curable, but treatable condition due to abnormal inflamed sweat glands in the body folds (axilla, inframammary, groin, medial thighs), causing recurrent painful draining cysts and scarring. It can be associated with severe scarring acne and cysts; also abscesses and scarring of scalp. The goal is control and prevention of flares, as it is not curable. Scars are permanent and can be thickened. Treatment may include daily use of topical medication and oral antibiotics.  Oral isotretinoin may also be helpful.  For some cases, Humira, Bimzelx or Cosentyx (biologic injections) may be prescribed to decrease the inflammatory process and prevent flares.  When indicated, inflamed cysts may also be treated surgically.    Due to recent changes in healthcare laws, you may see results of your pathology and/or laboratory studies on MyChart before the doctors have had a chance to review them. We understand that in some cases there may be results that are confusing or concerning to you. Please understand that not all results are received at the same time and often the doctors may need to interpret multiple results in order to provide you with the best plan of care or course of treatment. Therefore, we ask that you please give [place] 2 business days to thoroughly review all your results before contacting the office for clarification. Should we see a critical lab result, you will be contacted sooner.   If You Need Anything After Your Visit  If you have any questions or  concerns for your doctor, please call our main line at [phone] and press option 4 to reach your doctor's medical assistant. If no one answers, please leave a voicemail as directed and we will return your call as soon as possible. Messages left after 4 pm will be answered the following business day.   You may also send [place] a message via MyChart. We typically respond to MyChart messages within 1-2 business days.  For prescription refills, please ask your pharmacy to contact our office. Our fax number is [phone].  If you have an urgent issue when the clinic is closed that cannot wait until the next business day, you can page your doctor at the number below.    Please note that while we do our best to be available for urgent issues outside of office hours, we are not available 24/7.   If you have an urgent issue and are unable to reach [place], you may choose to seek medical care at your doctor's office, retail clinic, urgent care center, or emergency room.  If you have a medical emergency, please immediately call 911 or go to the emergency department.  Pager Numbers  - Dr. Gwen Pounds: [phone]  - Dr. Neale Burly: [phone]  - Dr. Roseanne Reno: [phone]  In the event of inclement weather, please call our main line at [phone] for an update on the status of any delays or closures.  Dermatology Medication Tips: Please keep the boxes that topical medications come in in order to help keep track of the instructions about where and how to use these. Pharmacies typically print the  medication instructions only on the boxes and not directly on the medication tubes.   If your medication is too expensive, please contact our office at [phone] option 4 or send [place] a message through MyChart.   We are unable to tell what your co-pay for medications will be in advance as this is different depending on your insurance coverage. However, we may be able to find a substitute medication at lower cost or  fill out paperwork to get insurance to cover a needed medication.   If a prior authorization is required to get your medication covered by your insurance company, please allow [place] 1-2 business days to complete this process.  Drug prices often vary depending on where the prescription is filled and some pharmacies may offer cheaper prices.  The website www.goodrx.com contains coupons for medications through different pharmacies. The prices here do not account for what the cost may be with help from insurance (it may be cheaper with your insurance), but the website can give you the price if you did not use any insurance.  - You can print the associated coupon and take it with your prescription to the pharmacy.  - You may also stop by our office during regular business hours and pick up a GoodRx coupon card.  - If you need your prescription sent electronically to a different pharmacy, notify our office through [facility] or by phone at [phone] option 4.     Si Usted Necesita Algo Despus de Su Visita  Tambin puede enviarnos un mensaje a travs de Clinical cytogeneticist. Por lo general respondemos a los mensajes de MyChart en el transcurso de 1 a 2 das hbiles.  Para renovar recetas, por favor pida a su farmacia que se ponga en contacto con nuestra oficina. Annie Sable de fax es [place] [phone].  Si tiene un asunto urgente cuando la clnica est cerrada y que no puede esperar hasta el siguiente da hbil, puede llamar/localizar a su doctor(a) al nmero que aparece a continuacin.   Por favor, tenga en cuenta que aunque hacemos todo lo posible para estar disponibles para asuntos urgentes fuera del horario de [place], no estamos disponibles las 24 horas del da, los 7 [address]  [address] de la [place].   Si tiene un problema urgente y no puede comunicarse con nosotros, puede optar por buscar atencin mdica  en el consultorio de su doctor(a), en una clnica privada, en un centro de atencin urgente o en una sala  de emergencias.  Si tiene Engineer, drilling, por favor llame inmediatamente al 911 o vaya a la sala de emergencias.  Nmeros de bper  - Dr. Gwen Pounds: [phone]  - Dra. Moye: [phone]  - Dra. Roseanne Reno: [phone]  En caso de inclemencias del [place], por favor llame a Lacy Duverney principal al [phone] para una actualizacin sobre el [place] de cualquier retraso o cierre.  Consejos para la medicacin en dermatologa: Por favor, guarde las cajas en las que vienen los medicamentos de uso tpico para ayudarle a seguir las instrucciones sobre dnde y cmo usarlos. Las farmacias generalmente imprimen las instrucciones del medicamento slo en las cajas y no directamente en los tubos del [place].   Si su medicamento es muy caro, por favor, pngase en contacto con Rolm Gala llamando al [phone] y presione la opcin 4 o envenos un mensaje a travs de Clinical cytogeneticist.   No podemos decirle cul ser su copago por los medicamentos por adelantado ya que esto es diferente dependiendo de la cobertura de su seguro. Sin  embargo, es posible que podamos encontrar un medicamento sustituto a Audiological scientist un formulario para que el seguro cubra el medicamento que se considera necesario.   Si se requiere una autorizacin previa para que su compaa de seguros [place] su medicamento, por favor permtanos de 1 a 2 das hbiles para completar [address]  Los precios de los medicamentos varan con frecuencia dependiendo del Environmental consultant de dnde se surte la receta y alguna farmacias pueden ofrecer precios ms baratos.  El sitio web www.goodrx.com tiene cupones para medicamentos de Health and safety inspector. Los precios aqu no tienen en cuenta lo que podra costar con la ayuda del seguro (puede ser ms barato con su seguro), pero el sitio web puede darle el precio si no utiliz Tourist information centre manager.  - Puede imprimir el cupn correspondiente y llevarlo con su receta a la farmacia.  - Tambin puede pasar  por nuestra oficina durante el horario de atencin regular y Education officer, museum una tarjeta de cupones de GoodRx.  - Si necesita que su receta se enve electrnicamente a una farmacia diferente, informe a nuestra oficina a travs de MyChart de [place] o por telfono llamando al [phone] y presione la opcin 4.

## 2023-02-27 NOTE — Progress Notes (Signed)
   New Patient Visit   Subjective  Victoria Shepard is a 52 y.o. female who presents for the following: bumps. Underarms, breast folds, groin folds. Painful boils. Happens every month. At least one active lesion per month. Dur: 2 years.   The following portions of the chart were reviewed this encounter and updated as appropriate: medications, allergies, medical history  Review of Systems:  No other skin or systemic complaints except as noted in HPI or Assessment and Plan.  Objective  Well appearing patient in no apparent distress; mood and affect are within normal limits.  A focused examination was performed of the following areas:  axillae  Relevant exam findings are noted in the Assessment and Plan.       Assessment & Plan     HIDRADENITIS SUPPURATIVA Exam: scarring at axillae, breast folds, medial thighs. Erythematous nodule at left axilla.  Chronic and persistent condition with duration or expected duration over one year. Condition is bothersome/symptomatic for patient. Currently flared.   Hidradenitis Suppurativa is a chronic; persistent; non-curable, but treatable condition due to abnormal inflamed sweat glands in the body folds (axilla, inframammary, groin, medial thighs), causing recurrent painful draining cysts and scarring. It can be associated with severe scarring acne and cysts; also abscesses and scarring of scalp. The goal is control and prevention of flares, as it is not curable. Scars are permanent and can be thickened. Treatment may include daily use of topical medication and oral antibiotics.  Oral isotretinoin may also be helpful.  For some cases, Humira, Bimzelx or Cosentyx (biologic injections) may be prescribed to decrease the inflammatory process and prevent flares.  When indicated, inflamed cysts may also be treated surgically.  Treatment Plan: Start Doxycycline 50 mg once daily with dinner. #90, 1 Rf  Doxycycline should be taken with food to prevent  nausea. Do not lay down for 30 minutes after taking. Be cautious with sun exposure and use good sun protection while on this medication. Pregnant women should not take this medication.   Angiofibroma/Fibrous Papule - 2 mm smooth symmetric flesh colored to pink papule without features suspicious for malignancy on dermoscopy above right medial brow - Benign-appearing. Plan removal at follow up appointment.    ACROCHORDONS (Skin Tags) - Removal desired by patient - Fleshy, skin-colored pedunculated papules - Benign appearing.  - Patient desires removal. Reviewed that this is not covered by insurance and they will be charged a cosmetic fee for removal. Patient signed non-covered consent.  - Prior to procedure, discussed risks of blister formation, small wound, skin dyspigmentation, or rare scar following cryotherapy.   Destruction Procedure Note Destruction method: cryotherapy   Informed consent: discussed and consent obtained   Lesion destroyed using liquid nitrogen: Yes   Outcome: patient tolerated procedure well with no complications   Post-procedure details: wound care instructions given   Locations: neck x12, right axilla x3 # of Lesions Treated: 15   Return in about 3 months (around 05/30/2023) for HS Recheck, biopsy, skin tags axilla.  I, Lawson Radar, CMA, am acting as scribe for Armida Sans, MD.   Documentation: I have reviewed the above documentation for accuracy and completeness, and I agree with the above.  Armida Sans, MD

## 2023-02-28 NOTE — Telephone Encounter (Signed)
Called and got patient an earlier appointment at the end of May, also had some questions regarding Linzess.  Spoke with provider, she stated that she will get back with her at her earliest convenience regarding her concern.

## 2023-03-01 MED ORDER — LINACLOTIDE 145 MCG PO CAPS: 145.0000 ug | ORAL_CAPSULE | Freq: Every day | ORAL | 1 refills | Status: DC

## 2023-03-06 ENCOUNTER — Encounter: Payer: Self-pay | Admitting: Dermatology

## 2023-03-09 LAB — COLOGUARD: COLOGUARD: NEGATIVE

## 2023-03-12 NOTE — Progress Notes (Signed)
Hi Victoria Shepard. Your cologuard test was negative.  We will repeat it in 3 years.

## 2023-03-14 ENCOUNTER — Encounter: Payer: Self-pay | Admitting: Nurse Practitioner

## 2023-03-14 ENCOUNTER — Telehealth: Payer: No Typology Code available for payment source | Admitting: Nurse Practitioner

## 2023-03-14 DIAGNOSIS — Z6837 Body mass index (BMI) 37.0-37.9, adult: Secondary | ICD-10-CM | POA: Diagnosis not present

## 2023-03-14 NOTE — Assessment & Plan Note (Signed)
Recommended eating smaller high protein, low fat meals more frequently and exercising 30 mins a day 5 times a week with a goal of 10-15lb weight loss in the next 3 months. Continue with Wegovy 1mg .  She is having some constipation but she is taking Linzess.

## 2023-03-14 NOTE — Progress Notes (Signed)
BP 120/77   Pulse 97   Wt 214 lb (97.1 kg)   BMI 37.91 kg/m    Subjective:    Patient ID: Victoria Shepard, female    DOB: [birth year], 52 y.o.   MRN: [id number]  HPI: Victoria Shepard is a 52 y.o. female  Chief Complaint  Patient presents with   wegovy   WEIGHT GAIN Has lost 11 lbs since starting the medication.  Has not lost anymore weight.  Does have constipation.  She does doing well with Wegovy 1mg . Duration: years Previous attempts at weight loss: yes Complications of obesity: prediabetes, HTN, HLD Peak weight: 225lb Weight loss goal: 165lb Weight loss to date:  Requesting obesity pharmacotherapy: yes Current weight loss supplements/medications: yes Previous weight loss supplements/meds: yes Calories:    Relevant past medical, surgical, family and social history reviewed and updated as indicated. Interim medical history since our last visit reviewed. Allergies and medications reviewed and updated.  Review of Systems  Constitutional:  Negative for unexpected weight change.    Per HPI unless specifically indicated above     Objective:    BP 120/77   Pulse 97   Wt 214 lb (97.1 kg)   BMI 37.91 kg/m   Wt Readings from Last 3 Encounters:  03/14/23 214 lb (97.1 kg)  02/25/23 219 lb 12.8 oz (99.7 kg)  01/25/23 219 lb 11.2 oz (99.7 kg)    Physical Exam Vitals and nursing note reviewed.  HENT:     Head: Normocephalic.     Right Ear: Hearing normal.     Left Ear: Hearing normal.     Nose: Nose normal.  Eyes:     Pupils: Pupils are equal, round, and reactive to light.  Pulmonary:     Effort: Pulmonary effort is normal. No respiratory distress.  Neurological:     Mental Status: [name].  Psychiatric:        Mood and Affect: Mood normal.        Behavior: Behavior normal.        Thought Content: Thought content normal.        Judgment: Judgment normal.     Results for orders placed or performed in visit on 02/25/23  CBC with Differential/Platelet   Result Value Ref Range   WBC 7.7 3.4 - 10.8 x10E3/uL   RBC 5.08 3.77 - 5.28 x10E6/uL   Hemoglobin 15.0 11.1 - 15.9 g/dL   Hematocrit 40.9 81.1 - 46.6 %   MCV 91 79 - 97 fL   MCH 29.5 26.6 - 33.0 pg   MCHC 32.6 31.5 - 35.7 g/dL   RDW 91.4 78.2 - 95.6 %   Platelets 381 150 - 450 x10E3/uL   Neutrophils 65 Not Estab. %   Lymphs 24 Not Estab. %   Monocytes 8 Not Estab. %   Eos 2 Not Estab. %   Basos 1 Not Estab. %   Neutrophils Absolute 5.1 1.4 - 7.0 x10E3/uL   Lymphocytes Absolute 1.8 0.7 - 3.1 x10E3/uL   Monocytes Absolute 0.6 0.1 - 0.9 x10E3/uL   EOS (ABSOLUTE) 0.2 0.0 - 0.4 x10E3/uL   Basophils Absolute 0.1 0.0 - 0.2 x10E3/uL   Immature Granulocytes 0 Not Estab. %   Immature Grans (Abs) 0.0 0.0 - 0.1 x10E3/uL  Comprehensive metabolic panel  Result Value Ref Range   Glucose 104 (H) 70 - 99 mg/dL   BUN 7 6 - 24 mg/dL   Creatinine, Ser 2.13 0.57 - 1.00 mg/dL   eGFR  83 >59 mL/min/1.73   BUN/Creatinine Ratio 8 (L) 9 - 23   Sodium 141 134 - 144 mmol/L   Potassium 4.4 3.5 - 5.2 mmol/L   Chloride 104 96 - 106 mmol/L   CO2 21 20 - 29 mmol/L   Calcium 9.3 8.7 - 10.2 mg/dL   Total Protein 7.4 6.0 - 8.5 g/dL   Albumin 4.5 3.9 - 4.9 g/dL   Globulin, Total 2.9 1.5 - 4.5 g/dL   Albumin/Globulin Ratio 1.6 1.2 - 2.2   Bilirubin Total 1.5 (H) 0.0 - 1.2 mg/dL   Alkaline Phosphatase 73 44 - 121 IU/L   AST 24 0 - 40 IU/L   ALT 27 0 - 32 IU/L  Lipid panel  Result Value Ref Range   Cholesterol, Total 125 100 - 199 mg/dL   Triglycerides 161 0 - 149 mg/dL   HDL 39 (L) >09 mg/dL   VLDL Cholesterol Cal 19 5 - 40 mg/dL   LDL Chol Calc (NIH) 67 0 - 99 mg/dL   Chol/HDL Ratio 3.2 0.0 - 4.4 ratio  TSH  Result Value Ref Range   TSH 1.770 0.450 - 4.500 uIU/mL  Urinalysis, Routine w reflex microscopic  Result Value Ref Range   Specific Gravity, UA >1.030 (H) 1.005 - 1.030   pH, UA 5.5 5.0 - 7.5   Color, UA Yellow Yellow   Appearance Ur Clear Clear   Leukocytes,UA Negative Negative    Protein,UA Trace (A) Negative/Trace   Glucose, UA Negative Negative   Ketones, UA Negative Negative   RBC, UA Negative Negative   Bilirubin, UA Negative Negative   Urobilinogen, Ur 1.0 0.2 - 1.0 mg/dL   Nitrite, UA Negative Negative   Microscopic Examination Comment   HgB A1c  Result Value Ref Range   Hgb A1c MFr Bld 6.0 (H) 4.8 - 5.6 %   Est. average glucose Bld gHb Est-mCnc 126 mg/dL  Hepatitis C Antibody  Result Value Ref Range   Hep C Virus Ab Non Reactive Non Reactive  HIV Antibody (routine testing w rflx)  Result Value Ref Range   HIV Screen 4th Generation wRfx Non Reactive Non Reactive  Cologuard  Result Value Ref Range   COLOGUARD Negative Negative      Assessment & Plan:   Problem List Items Addressed This Visit       Other   Morbid obesity (HCC) - Primary    Recommended eating smaller high protein, low fat meals more frequently and exercising 30 mins a day 5 times a week with a goal of 10-15lb weight loss in the next 3 months. Continue with Wegovy 1mg .  She is having some constipation but she is taking Linzess.           Follow up plan: Return in about 3 months (around 06/14/2023) for Weight Managment.   This visit was completed via MyChart due to the restrictions of the COVID-19 pandemic. All issues as above were discussed and addressed. Physical exam was done as above through visual confirmation on MyChart. If it was felt that the patient should be evaluated in the office, they were directed there. The patient verbally consented to this visit. Location of the patient: Home Location of the provider: Office Those involved with this call:  Provider: Larae Grooms, NP CMA: Maggie Font Front Desk/Registration: Servando Snare This encounter was conducted via video.  I spent 20 dedicated to the care of this patient on the date of this encounter to include previsit review of symptoms, plan of  care and follow up, face to face time with the patient, and post visit  ordering of testing.

## 2023-03-14 NOTE — Progress Notes (Signed)
Called to schedule her 3 months and she stated that she wanted to be seen by you before you going out on maternity leave.  She was scared that whomever saw her would not up her dose.

## 2023-03-16 ENCOUNTER — Other Ambulatory Visit: Payer: Self-pay | Admitting: Nurse Practitioner

## 2023-03-17 ENCOUNTER — Encounter: Payer: Self-pay | Admitting: Nurse Practitioner

## 2023-03-18 ENCOUNTER — Ambulatory Visit
Admission: RE | Admit: 2023-03-18 | Discharge: 2023-03-18 | Disposition: A | Discharge status: Home / Self Care | Payer: No Typology Code available for payment source | Source: Ambulatory Visit | Attending: Obstetrics and Gynecology | Admitting: Obstetrics and Gynecology

## 2023-03-18 DIAGNOSIS — Z1231 Encounter for screening mammogram for malignant neoplasm of breast: Secondary | ICD-10-CM | POA: Diagnosis present

## 2023-03-18 MED ORDER — WEGOVY 1 MG/0.5ML ~~LOC~~ SOAJ: 1.0000 mg | SUBCUTANEOUS | 0 refills | Status: DC

## 2023-03-18 NOTE — Telephone Encounter (Signed)
Unable to refill per protocol, Rx request was refilled 03/18/23, duplicate request.  Requested Prescriptions  Pending Prescriptions Disp Refills   WEGOVY 1 MG/0.5ML SOAJ [Pharmacy Med Name: ZOXWRU 1 MG/0.5ML Subcutaneous Solution Auto-injector] 4 mL 0    Sig: INJECT ONE SYRINGEFUL INTO THE SKIN ONCE WEEKLY     Endocrinology:  Diabetes - GLP-1 Receptor Agonists - semaglutide Failed - 03/16/2023  1:25 PM      Failed - HBA1C in normal range and within 180 days    Hgb A1c MFr Bld  Date Value Ref Range Status  02/25/2023 6.0 (H) 4.8 - 5.6 % Final    Comment:             Prediabetes: 5.7 - 6.4          Diabetes: >6.4          Glycemic control for adults with diabetes: <7.0          Passed - Cr in normal range and within 360 days    Creatinine  Date Value Ref Range Status  06/18/2014 1.16 0.60 - 1.30 mg/dL Final   Creatinine, Ser  Date Value Ref Range Status  02/25/2023 0.85 0.57 - 1.00 mg/dL Final         Passed - Valid encounter within last 6 months    Recent Outpatient Visits           4 days ago Morbid obesity (HCC)   [place] [facility] Larae Grooms, NP   3 weeks ago Annual physical exam   [place] [facility] Larae Grooms, NP   1 month ago Anxiety   [place] [facility] Larae Grooms, NP   1 month ago Chronic constipation   [place] [facility] Larae Grooms, NP   2 months ago Primary hypertension   [place] [facility] Larae Grooms, NP       Future Appointments             In 2 weeks Larae Grooms, NP [facility]   In 2 months Deirdre Evener, MD [facility]

## 2023-04-01 ENCOUNTER — Telehealth: Payer: No Typology Code available for payment source | Admitting: Nurse Practitioner

## 2023-04-03 NOTE — Progress Notes (Unsigned)
There were no vitals taken for this visit.   Subjective:    Patient ID: Victoria Shepard, female    DOB: 1971-09-07, 52 y.o.   MRN: [id number]  HPI: Victoria Shepard is a 52 y.o. female  No chief complaint on file.  WEIGHT GAIN Has lost 11 lbs since starting the medication.  Has not lost anymore weight.  Does have constipation.  She does doing well with Wegovy 1mg . Duration: years Previous attempts at weight loss: yes Complications of obesity: prediabetes, HTN, HLD Peak weight: 225lb Weight loss goal: 165lb Weight loss to date:  Requesting obesity pharmacotherapy: yes Current weight loss supplements/medications: yes Previous weight loss supplements/meds: yes Calories:    Relevant past medical, surgical, family and social history reviewed and updated as indicated. Interim medical history since our last visit reviewed. Allergies and medications reviewed and updated.  Review of Systems  Constitutional:  Negative for unexpected weight change.    Per HPI unless specifically indicated above     Objective:    There were no vitals taken for this visit.  Wt Readings from Last 3 Encounters:  03/14/23 214 lb (97.1 kg)  02/25/23 219 lb 12.8 oz (99.7 kg)  01/25/23 219 lb 11.2 oz (99.7 kg)    Physical Exam Vitals and nursing note reviewed.  HENT:     Head: Normocephalic.     Right Ear: Hearing normal.     Left Ear: Hearing normal.     Nose: Nose normal.  Eyes:     Pupils: Pupils are equal, round, and reactive to light.  Pulmonary:     Effort: Pulmonary effort is normal. No respiratory distress.  Neurological:     Mental Status: [name].  Psychiatric:        Mood and Affect: Mood normal.        Behavior: Behavior normal.        Thought Content: Thought content normal.        Judgment: Judgment normal.    Results for orders placed or performed in visit on 02/25/23  CBC with Differential/Platelet  Result Value Ref Range   WBC 7.7 3.4 - 10.8 x10E3/uL   RBC 5.08  3.77 - 5.28 x10E6/uL   Hemoglobin 15.0 11.1 - 15.9 g/dL   Hematocrit 82.9 56.2 - 46.6 %   MCV 91 79 - 97 fL   MCH 29.5 26.6 - 33.0 pg   MCHC 32.6 31.5 - 35.7 g/dL   RDW 13.0 86.5 - 78.4 %   Platelets 381 150 - 450 x10E3/uL   Neutrophils 65 Not Estab. %   Lymphs 24 Not Estab. %   Monocytes 8 Not Estab. %   Eos 2 Not Estab. %   Basos 1 Not Estab. %   Neutrophils Absolute 5.1 1.4 - 7.0 x10E3/uL   Lymphocytes Absolute 1.8 0.7 - 3.1 x10E3/uL   Monocytes Absolute 0.6 0.1 - 0.9 x10E3/uL   EOS (ABSOLUTE) 0.2 0.0 - 0.4 x10E3/uL   Basophils Absolute 0.1 0.0 - 0.2 x10E3/uL   Immature Granulocytes 0 Not Estab. %   Immature Grans (Abs) 0.0 0.0 - 0.1 x10E3/uL  Comprehensive metabolic panel  Result Value Ref Range   Glucose 104 (H) 70 - 99 mg/dL   BUN 7 6 - 24 mg/dL   Creatinine, Ser 6.96 0.57 - 1.00 mg/dL   eGFR 83 >29 BM/WUX/3.24   BUN/Creatinine Ratio 8 (L) 9 - 23   Sodium 141 134 - 144 mmol/L   Potassium 4.4 3.5 - 5.2 mmol/L  Chloride 104 96 - 106 mmol/L   CO2 21 20 - 29 mmol/L   Calcium 9.3 8.7 - 10.2 mg/dL   Total Protein 7.4 6.0 - 8.5 g/dL   Albumin 4.5 3.9 - 4.9 g/dL   Globulin, Total 2.9 1.5 - 4.5 g/dL   Albumin/Globulin Ratio 1.6 1.2 - 2.2   Bilirubin Total 1.5 (H) 0.0 - 1.2 mg/dL   Alkaline Phosphatase 73 44 - 121 IU/L   AST 24 0 - 40 IU/L   ALT 27 0 - 32 IU/L  Lipid panel  Result Value Ref Range   Cholesterol, Total 125 100 - 199 mg/dL   Triglycerides 098 0 - 149 mg/dL   HDL 39 (L) >11 mg/dL   VLDL Cholesterol Cal 19 5 - 40 mg/dL   LDL Chol Calc (NIH) 67 0 - 99 mg/dL   Chol/HDL Ratio 3.2 0.0 - 4.4 ratio  TSH  Result Value Ref Range   TSH 1.770 0.450 - 4.500 uIU/mL  Urinalysis, Routine w reflex microscopic  Result Value Ref Range   Specific Gravity, UA >1.030 (H) 1.005 - 1.030   pH, UA 5.5 5.0 - 7.5   Color, UA Yellow Yellow   Appearance Ur Clear Clear   Leukocytes,UA Negative Negative   Protein,UA Trace (A) Negative/Trace   Glucose, UA Negative Negative    Ketones, UA Negative Negative   RBC, UA Negative Negative   Bilirubin, UA Negative Negative   Urobilinogen, Ur 1.0 0.2 - 1.0 mg/dL   Nitrite, UA Negative Negative   Microscopic Examination Comment   HgB A1c  Result Value Ref Range   Hgb A1c MFr Bld 6.0 (H) 4.8 - 5.6 %   Est. average glucose Bld gHb Est-mCnc 126 mg/dL  Hepatitis C Antibody  Result Value Ref Range   Hep C Virus Ab Non Reactive Non Reactive  HIV Antibody (routine testing w rflx)  Result Value Ref Range   HIV Screen 4th Generation wRfx Non Reactive Non Reactive  Cologuard  Result Value Ref Range   COLOGUARD Negative Negative      Assessment & Plan:   Problem List Items Addressed This Visit   None    Follow up plan: No follow-ups on file.   This visit was completed via MyChart due to the restrictions of the COVID-19 pandemic. All issues as above were discussed and addressed. Physical exam was done as above through visual confirmation on MyChart. If it was felt that the patient should be evaluated in the office, they were directed there. The patient verbally consented to this visit. Location of the patient: Home Location of the provider: Office Those involved with this call:  Provider: Larae Grooms, NP CMA: Maggie Font Front Desk/Registration: Servando Snare This encounter was conducted via video.  I spent 20 dedicated to the care of this patient on the date of this encounter to include previsit review of symptoms, plan of care and follow up, face to face time with the patient, and post visit ordering of testing.

## 2023-04-04 ENCOUNTER — Ambulatory Visit (INDEPENDENT_AMBULATORY_CARE_PROVIDER_SITE_OTHER): Payer: No Typology Code available for payment source | Admitting: Nurse Practitioner

## 2023-04-04 ENCOUNTER — Encounter: Payer: Self-pay | Admitting: Nurse Practitioner

## 2023-04-04 MED ORDER — ONDANSETRON HCL 4 MG PO TABS: 4.0000 mg | ORAL_TABLET | Freq: Three times a day (TID) | ORAL | 1 refills | Status: DC | PRN

## 2023-04-04 MED ORDER — WEGOVY 1.7 MG/0.75ML ~~LOC~~ SOAJ: 1.7000 mg | SUBCUTANEOUS | 2 refills | Status: DC

## 2023-04-04 NOTE — Assessment & Plan Note (Signed)
Recommended eating smaller high protein, low fat meals more frequently and exercising 30 mins a day 5 times a week with a goal of 10-15lb weight loss in the next 3 months. Continue with Wegovy 1.7mg .  She is having some constipation but she is taking Linzess.  She is also having some nausea.  Zofran sent to the pharmacy.  Follow up in 4 months.  Call sooner if concerns arise.

## 2023-04-15 ENCOUNTER — Encounter: Payer: Self-pay | Admitting: Nurse Practitioner

## 2023-04-16 MED ORDER — WEGOVY 1.7 MG/0.75ML ~~LOC~~ SOAJ: 1.7000 mg | SUBCUTANEOUS | 4 refills | Status: DC

## 2023-04-22 ENCOUNTER — Other Ambulatory Visit: Payer: Self-pay | Admitting: Nurse Practitioner

## 2023-04-22 NOTE — Telephone Encounter (Signed)
Requested Prescriptions  Pending Prescriptions Disp Refills   lisinopril (ZESTRIL) 20 MG tablet [Pharmacy Med Name: LISINOPRIL 20 MG TABLET] 90 tablet 0    Sig: TAKE 1 TABLET BY MOUTH EVERY DAY     Cardiovascular:  ACE Inhibitors Passed - 04/22/2023  2:23 AM      Passed - Cr in normal range and within 180 days    Creatinine  Date Value Ref Range Status  06/18/2014 1.16 0.60 - 1.30 mg/dL Final   Creatinine, Ser  Date Value Ref Range Status  02/25/2023 0.85 0.57 - 1.00 mg/dL Final         Passed - K in normal range and within 180 days    Potassium  Date Value Ref Range Status  02/25/2023 4.4 3.5 - 5.2 mmol/L Final  06/18/2014 3.9 3.5 - 5.1 mmol/L Final         Passed - Patient is not pregnant      Passed - Last BP in normal range    BP Readings from Last 1 Encounters:  04/04/23 128/86         Passed - Valid encounter within last 6 months    Recent Outpatient Visits           2 weeks ago Morbid obesity (HCC)   [place] [facility] Larae Grooms, NP   1 month ago Morbid obesity [facility])   [place] [facility] Larae Grooms, NP   1 month ago Annual physical exam   [place] [facility] Larae Grooms, NP   2 months ago Anxiety   [place] [facility] Larae Grooms, NP   2 months ago Chronic constipation   [place] [facility] Larae Grooms, NP       Future Appointments             In 1 month Gwen Pounds Dineen Kid, MD [facility]   In 3 months Larae Grooms, NP [place] [facility], [facility]

## 2023-05-02 ENCOUNTER — Other Ambulatory Visit: Payer: Self-pay | Admitting: Nurse Practitioner

## 2023-05-02 ENCOUNTER — Other Ambulatory Visit: Payer: Self-pay | Admitting: Dermatology

## 2023-05-03 NOTE — Telephone Encounter (Signed)
Requested medication (s) are due for refill today: yes  Requested medication (s) are on the active medication list: yes  Last refill:  02/12/23  Future visit scheduled: yes  Notes to clinic:  Unable to refill per protocol, cannot delegate.      Requested Prescriptions  Pending Prescriptions Disp Refills   clonazePAM (KLONOPIN) 0.5 MG tablet [Pharmacy Med Name: CLONAZEPAM 0.5 MG TABLET] 30 tablet 0    Sig: TAKE 1 TABLET BY MOUTH EVERY DAY     Not Delegated - Psychiatry: Anxiolytics/Hypnotics 2 Failed - 05/02/2023 12:14 PM      Failed - This refill cannot be delegated      Failed - Urine Drug Screen completed in last 360 days      Passed - Patient is not pregnant      Passed - Valid encounter within last 6 months    Recent Outpatient Visits           4 weeks ago Morbid obesity (HCC)   [place] [facility] Larae Grooms, NP   1 month ago Morbid obesity [facility])   [place] [facility] Larae Grooms, NP   2 months ago Annual physical exam   [place] [facility] Larae Grooms, NP   2 months ago Anxiety   [place] [facility] Larae Grooms, NP   3 months ago Chronic constipation   [place] [facility] Larae Grooms, NP       Future Appointments             In 1 month Gwen Pounds Dineen Kid, MD [facility]   In 3 months Larae Grooms, NP [place] [facility], [facility]

## 2023-05-17 ENCOUNTER — Encounter: Payer: Self-pay | Admitting: Nurse Practitioner

## 2023-06-05 ENCOUNTER — Ambulatory Visit: Payer: No Typology Code available for payment source | Admitting: Dermatology

## 2023-06-27 ENCOUNTER — Encounter: Payer: Self-pay | Admitting: Nurse Practitioner

## 2023-06-27 MED ORDER — WEGOVY 1.7 MG/0.75ML ~~LOC~~ SOAJ
1.7000 mg | SUBCUTANEOUS | 1 refills | Status: DC
Start: 2023-06-27 — End: 2023-08-05

## 2023-07-01 ENCOUNTER — Telehealth: Payer: Self-pay

## 2023-07-01 ENCOUNTER — Encounter: Payer: Self-pay | Admitting: Nurse Practitioner

## 2023-07-01 ENCOUNTER — Telehealth: Payer: Self-pay | Admitting: Nurse Practitioner

## 2023-07-01 NOTE — Telephone Encounter (Signed)
Pt is calling in requesting to speak with [name] regarding her [facility]. Pt says she has contacted her insurance and they haven't received a prior auth request or anything and pt was told it was sent a week ago. Pt says she would like this handled immediately because she takes her last shot tomorrow. Please follow up with pt.

## 2023-07-01 NOTE — Telephone Encounter (Signed)
The patient states it is the only current one on file which is the [employer]. She says please file that one for the [facility]. Please assist her further as she has been getting this since April and been using the same insurance. She says she only has one pen available left and she needs to use it tomorrow.

## 2023-07-01 NOTE — Telephone Encounter (Signed)
Left message for patient to clarify which insurance is active and current in order for our office to initiate the prior authorization for [facility].   OK for [facility] to gather information if patient calls back.

## 2023-07-01 NOTE — Telephone Encounter (Signed)
Reached out to patient via MyChart to update her on her current prior authorization for [facility] prescription.

## 2023-07-12 ENCOUNTER — Encounter: Payer: Self-pay | Admitting: Nurse Practitioner

## 2023-07-12 MED ORDER — ONDANSETRON 4 MG PO TBDP: 4.0000 mg | ORAL_TABLET | Freq: Three times a day (TID) | ORAL | 1 refills | Status: DC | PRN

## 2023-08-05 ENCOUNTER — Other Ambulatory Visit: Payer: Self-pay | Admitting: Physician Assistant

## 2023-08-05 ENCOUNTER — Ambulatory Visit (INDEPENDENT_AMBULATORY_CARE_PROVIDER_SITE_OTHER): Payer: No Typology Code available for payment source | Admitting: Nurse Practitioner

## 2023-08-05 ENCOUNTER — Other Ambulatory Visit: Payer: Self-pay | Admitting: Nurse Practitioner

## 2023-08-05 ENCOUNTER — Encounter: Payer: Self-pay | Admitting: Nurse Practitioner

## 2023-08-05 MED ORDER — WEGOVY 2.4 MG/0.75ML ~~LOC~~ SOAJ: 2.4000 mg | SUBCUTANEOUS | 1 refills | Status: DC

## 2023-08-05 MED ORDER — ONDANSETRON 8 MG PO TBDP: 8.0000 mg | ORAL_TABLET | Freq: Three times a day (TID) | ORAL | 1 refills | Status: DC | PRN

## 2023-08-05 NOTE — Assessment & Plan Note (Signed)
Chronic.  Improved.  She has lost a total of 17lbs since started [facility].  She is doing well with her weight loss.  Does use Zofran PRN for nausea.  Continues to have constipation which is controlled with Linzess.

## 2023-08-05 NOTE — Telephone Encounter (Signed)
Requested medication (s) are due for refill today: yes  Requested medication (s) are on the active medication list: yes  Last refill:  05/06/23 #30  Future visit scheduled: yes  Notes to clinic:  med not delegated to NT to RF   Requested Prescriptions  Pending Prescriptions Disp Refills   clonazePAM (KLONOPIN) 0.5 MG tablet [Pharmacy Med Name: CLONAZEPAM 0.5 MG TABLET] 30 tablet 0    Sig: TAKE 1 TABLET BY MOUTH EVERY DAY     Not Delegated - Psychiatry: Anxiolytics/Hypnotics 2 Failed - 08/05/2023  3:26 PM      Failed - This refill cannot be delegated      Failed - Urine Drug Screen completed in last 360 days      Passed - Patient is not pregnant      Passed - Valid encounter within last 6 months    Recent Outpatient Visits           Today Morbid obesity (HCC)   [place] [facility] Larae Grooms, NP   4 months ago Morbid obesity [facility])   [place] [facility] Larae Grooms, NP   4 months ago Morbid obesity [facility])   [place] [facility] Larae Grooms, NP   5 months ago Annual physical exam   [facility] Larae Grooms, NP   5 months ago Anxiety   [place] [facility] Larae Grooms, NP       Future Appointments             In 2 months Larae Grooms, NP [place] [facility]   In 2 months Deirdre Evener, MD [facility]

## 2023-08-05 NOTE — Progress Notes (Signed)
BP 107/74   Pulse 76   Temp 98.6 F (37 C) (Oral)   Wt 193 lb 9.6 oz (87.8 kg)   SpO2 99%   BMI 34.29 kg/m    Subjective:    Patient ID: Victoria Shepard, female    DOB: October 05, 1971, 52 y.o.   MRN: [id number]  HPI: Victoria Shepard is a 52 y.o. female  Chief Complaint  Patient presents with   Medication Management    Pt states everything is going fine with current medications    WEIGHT GAIN Has lost 17 more lbs since increasing the dose of the medication.  She has lost a total of 32lbs since starting Wegovy.  Does have constipation- which is controlled with her Linzess.  She does doing well with Wegovy 1.7mg .   Duration: years Previous attempts at weight loss: yes Complications of obesity: prediabetes, HTN, HLD Peak weight: 225lb Weight loss goal: 165lb Weight loss to date:  Requesting obesity pharmacotherapy: yes Current weight loss supplements/medications: yes Previous weight loss supplements/meds: yes Calories:      Relevant past medical, surgical, family and social history reviewed and updated as indicated. Interim medical history since our last visit reviewed. Allergies and medications reviewed and updated.  Review of Systems  Constitutional:  Negative for unexpected weight change.  Gastrointestinal:  Positive for constipation and diarrhea.    Per HPI unless specifically indicated above     Objective:    BP 107/74   Pulse 76   Temp 98.6 F (37 C) (Oral)   Wt 193 lb 9.6 oz (87.8 kg)   SpO2 99%   BMI 34.29 kg/m   Wt Readings from Last 3 Encounters:  08/05/23 193 lb 9.6 oz (87.8 kg)  04/04/23 210 lb 12.8 oz (95.6 kg)  03/14/23 214 lb (97.1 kg)    Physical Exam Vitals and nursing note reviewed.  Constitutional:      General: She is not in acute distress.    Appearance: Normal appearance. She is obese. She is not ill-appearing, toxic-appearing or diaphoretic.  HENT:     Head: Normocephalic.     Right Ear: External ear normal.     Left Ear:  External ear normal.     Nose: Nose normal.     Mouth/Throat:     Mouth: Mucous membranes are moist.     Pharynx: Oropharynx is clear.  Eyes:     General:        Right eye: No discharge.        Left eye: No discharge.     Extraocular Movements: Extraocular movements intact.     Conjunctiva/sclera: Conjunctivae normal.     Pupils: Pupils are equal, round, and reactive to light.  Cardiovascular:     Rate and Rhythm: Normal rate and regular rhythm.     Heart sounds: No murmur heard. Pulmonary:     Effort: Pulmonary effort is normal. No respiratory distress.     Breath sounds: Normal breath sounds. No wheezing or rales.  Musculoskeletal:     Cervical back: Normal range of motion and neck supple.  Skin:    General: Skin is warm and dry.     Capillary Refill: Capillary refill takes less than 2 seconds.  Neurological:     General: No focal deficit present.     Mental Status: [name] and oriented to person, place, and time. Mental status is at baseline.  Psychiatric:        Mood and Affect: Mood normal.  Behavior: Behavior normal.        Thought Content: Thought content normal.        Judgment: Judgment normal.     Results for orders placed or performed in visit on 02/25/23  CBC with Differential/Platelet  Result Value Ref Range   WBC 7.7 3.4 - 10.8 x10E3/uL   RBC 5.08 3.77 - 5.28 x10E6/uL   Hemoglobin 15.0 11.1 - 15.9 g/dL   Hematocrit 81.1 91.4 - 46.6 %   MCV 91 79 - 97 fL   MCH 29.5 26.6 - 33.0 pg   MCHC 32.6 31.5 - 35.7 g/dL   RDW 78.2 95.6 - 21.3 %   Platelets 381 150 - 450 x10E3/uL   Neutrophils 65 Not Estab. %   Lymphs 24 Not Estab. %   Monocytes 8 Not Estab. %   Eos 2 Not Estab. %   Basos 1 Not Estab. %   Neutrophils Absolute 5.1 1.4 - 7.0 x10E3/uL   Lymphocytes Absolute 1.8 0.7 - 3.1 x10E3/uL   Monocytes Absolute 0.6 0.1 - 0.9 x10E3/uL   EOS (ABSOLUTE) 0.2 0.0 - 0.4 x10E3/uL   Basophils Absolute 0.1 0.0 - 0.2 x10E3/uL   Immature Granulocytes 0 Not  Estab. %   Immature Grans (Abs) 0.0 0.0 - 0.1 x10E3/uL  Comprehensive metabolic panel  Result Value Ref Range   Glucose 104 (H) 70 - 99 mg/dL   BUN 7 6 - 24 mg/dL   Creatinine, Ser 0.86 0.57 - 1.00 mg/dL   eGFR 83 >57 QI/ONG/2.95   BUN/Creatinine Ratio 8 (L) 9 - 23   Sodium 141 134 - 144 mmol/L   Potassium 4.4 3.5 - 5.2 mmol/L   Chloride 104 96 - 106 mmol/L   CO2 21 20 - 29 mmol/L   Calcium 9.3 8.7 - 10.2 mg/dL   Total Protein 7.4 6.0 - 8.5 g/dL   Albumin 4.5 3.9 - 4.9 g/dL   Globulin, Total 2.9 1.5 - 4.5 g/dL   Albumin/Globulin Ratio 1.6 1.2 - 2.2   Bilirubin Total 1.5 (H) 0.0 - 1.2 mg/dL   Alkaline Phosphatase 73 44 - 121 IU/L   AST 24 0 - 40 IU/L   ALT 27 0 - 32 IU/L  Lipid panel  Result Value Ref Range   Cholesterol, Total 125 100 - 199 mg/dL   Triglycerides 284 0 - 149 mg/dL   HDL 39 (L) >13 mg/dL   VLDL Cholesterol Cal 19 5 - 40 mg/dL   LDL Chol Calc (NIH) 67 0 - 99 mg/dL   Chol/HDL Ratio 3.2 0.0 - 4.4 ratio  TSH  Result Value Ref Range   TSH 1.770 0.450 - 4.500 uIU/mL  Urinalysis, Routine w reflex microscopic  Result Value Ref Range   Specific Gravity, UA >1.030 (H) 1.005 - 1.030   pH, UA 5.5 5.0 - 7.5   Color, UA Yellow Yellow   Appearance Ur Clear Clear   Leukocytes,UA Negative Negative   Protein,UA Trace (A) Negative/Trace   Glucose, UA Negative Negative   Ketones, UA Negative Negative   RBC, UA Negative Negative   Bilirubin, UA Negative Negative   Urobilinogen, Ur 1.0 0.2 - 1.0 mg/dL   Nitrite, UA Negative Negative   Microscopic Examination Comment   HgB A1c  Result Value Ref Range   Hgb A1c MFr Bld 6.0 (H) 4.8 - 5.6 %   Est. average glucose Bld gHb Est-mCnc 126 mg/dL  Hepatitis C Antibody  Result Value Ref Range   Hep C Virus Ab Non Reactive Non  Reactive  HIV Antibody (routine testing w rflx)  Result Value Ref Range   HIV Screen 4th Generation wRfx Non Reactive Non Reactive  Cologuard  Result Value Ref Range   COLOGUARD Negative Negative       Assessment & Plan:   Problem List Items Addressed This Visit       Other   Morbid obesity (HCC) - Primary    Chronic.  Improved.  She has lost a total of 17lbs since started [facility].  She is doing well with her weight loss.  Does use Zofran PRN for nausea.  Continues to have constipation which is controlled with Linzess.       Relevant Medications   Semaglutide-Weight Management (WEGOVY) 2.4 MG/0.75ML SOAJ      Follow up plan: Return in about 2 months (around 10/05/2023) for Weight Managment.

## 2023-08-06 NOTE — Telephone Encounter (Signed)
Requested Prescriptions  Pending Prescriptions Disp Refills   rosuvastatin (CRESTOR) 20 MG tablet [Pharmacy Med Name: ROSUVASTATIN CALCIUM 20 MG TAB] 90 tablet 1    Sig: TAKE 1 TABLET BY MOUTH EVERY DAY     Cardiovascular:  Antilipid - Statins 2 Failed - 08/05/2023  3:26 PM      Failed - Lipid Panel in normal range within the last 12 months    Cholesterol, Total  Date Value Ref Range Status  02/25/2023 125 100 - 199 mg/dL Final   LDL Chol Calc (NIH)  Date Value Ref Range Status  02/25/2023 67 0 - 99 mg/dL Final   HDL  Date Value Ref Range Status  02/25/2023 39 (L) >39 mg/dL Final   Triglycerides  Date Value Ref Range Status  02/25/2023 101 0 - 149 mg/dL Final         Passed - Cr in normal range and within 360 days    Creatinine  Date Value Ref Range Status  06/18/2014 1.16 0.60 - 1.30 mg/dL Final   Creatinine, Ser  Date Value Ref Range Status  02/25/2023 0.85 0.57 - 1.00 mg/dL Final         Passed - Patient is not pregnant      Passed - Valid encounter within last 12 months    Recent Outpatient Visits           Yesterday Morbid obesity (HCC)   [place] [facility] Larae Grooms, NP   4 months ago Morbid obesity [facility])   [place] [facility] Larae Grooms, NP   4 months ago Morbid [facility])   [place] [facility] Larae Grooms, NP   5 months ago Annual physical exam   [place] [facility] Larae Grooms, NP   5 months ago Anxiety   [place] [facility] Larae Grooms, NP       Future Appointments             In 2 months Larae Grooms, NP [place] [facility]   In 2 months Deirdre Evener, MD [place] [facility]             lisinopril (ZESTRIL) 20 MG tablet [Pharmacy Med Name: LISINOPRIL 20 MG TABLET] 90 tablet 0    Sig: TAKE 1 TABLET BY MOUTH EVERY DAY     Cardiovascular:  ACE Inhibitors Passed - 08/05/2023   3:26 PM      Passed - Cr in normal range and within 180 days    Creatinine  Date Value Ref Range Status  06/18/2014 1.16 0.60 - 1.30 mg/dL Final   Creatinine, Ser  Date Value Ref Range Status  02/25/2023 0.85 0.57 - 1.00 mg/dL Final         Passed - K in normal range and within 180 days    Potassium  Date Value Ref Range Status  02/25/2023 4.4 3.5 - 5.2 mmol/L Final  06/18/2014 3.9 3.5 - 5.1 mmol/L Final         Passed - Patient is not pregnant      Passed - Last BP in normal range    BP Readings from Last 1 Encounters:  08/05/23 107/74         Passed - Valid encounter within last 6 months    Recent Outpatient Visits           Yesterday Morbid obesity [facility])   [facility] [facility] Larae Grooms, NP  4 months ago Morbid obesity [facility])   [place] [facility] Larae Grooms, NP   4 months ago Morbid obesity [facility])   [place] [facility] Larae Grooms, NP   5 months ago Annual physical exam   [place] [facility] Larae Grooms, NP   5 months ago Anxiety   [place] [facility] Larae Grooms, NP       Future Appointments             In 2 months Larae Grooms, NP [facility], [facility]   In 2 months Deirdre Evener, MD [place] [facility]             fluticasone [facility]) 50 MCG/ACT nasal spray [Pharmacy Med Name: FLUTICASONE PROP 50 MCG SPRAY] 48 mL 2    Sig: SPRAY 2 SPRAYS INTO EACH NOSTRIL EVERY DAY     Ear, Nose, and Throat: Nasal Preparations - Corticosteroids Passed - 08/05/2023  3:26 PM      Passed - Valid encounter within last 12 months    Recent Outpatient Visits           Yesterday Morbid obesity [facility])   [place] [facility] Larae Grooms, NP   4 months ago Morbid obesity [facility])   [place] [facility] Larae Grooms, NP   4 months ago Morbid obesity [facility])   [place]  [facility] Larae Grooms, NP   5 months ago Annual physical exam   [place] [facility] Larae Grooms, NP   5 months ago Anxiety   [place] [facility] Larae Grooms, NP       Future Appointments             In 2 months Larae Grooms, NP  [facility]   In 2 months Deirdre Evener, MD [facility]

## 2023-08-26 ENCOUNTER — Telehealth: Payer: Self-pay

## 2023-08-26 NOTE — Telephone Encounter (Signed)
PA for [facility] initiated and submitted via Cover My Meds. Key: [id number]

## 2023-08-28 ENCOUNTER — Encounter: Payer: Self-pay | Admitting: Nurse Practitioner

## 2023-09-09 ENCOUNTER — Other Ambulatory Visit: Payer: Self-pay | Admitting: Dermatology

## 2023-09-09 ENCOUNTER — Other Ambulatory Visit: Payer: Self-pay | Admitting: Nurse Practitioner

## 2023-09-10 NOTE — Telephone Encounter (Signed)
Requested medication (s) are due for refill today - yes  Requested medication (s) are on the active medication list -yes  Future visit scheduled -yes  Last refill: 08/06/23 #30  Notes to clinic: non delegated Rx  Requested Prescriptions  Pending Prescriptions Disp Refills   clonazePAM (KLONOPIN) 0.5 MG tablet [Pharmacy Med Name: CLONAZEPAM 0.5 MG TABLET] 30 tablet 0    Sig: TAKE 1 TABLET BY MOUTH EVERY DAY     Not Delegated - Psychiatry: Anxiolytics/Hypnotics 2 Failed - 09/09/2023  9:16 PM      Failed - This refill cannot be delegated      Failed - Urine Drug Screen completed in last 360 days      Passed - Patient is not pregnant      Passed - Valid encounter within last 6 months    Recent Outpatient Visits           1 month ago Morbid obesity (HCC)   [place] [facility]ae Grooms, NP   5 months ago Morbid obesity [facility])   [place] [facility]ae G[facility], NP   6 months ago Morbid obesity [facility])   [place] [facility] Larae Grooms, NP   6 months ago Annual physical exam   [place] [facility] Larae G[facility], NP   7 months ago Anxiety   [place] [facility] Larae Grooms, NP       Future Appointments             In 3 weeks Larae Grooms, NP [place] [facility]   In 1 month Deirdre Evener, MD [place] [facility]               Requested Prescriptions  Pending Prescriptions Disp Refills   clonazePAM (KLONOPIN) 0.5 MG tablet [Pharmacy Med Name: CLONAZEPAM 0.5 MG TABLET] 30 tablet 0    Sig: TAKE 1 TABLET BY MOUTH EVERY DAY     Not Delegated - Psychiatry: Anxiolytics/Hypnotics 2 Failed - 09/09/2023  9:16 PM      Failed - This refill cannot be delegated      Failed - Urine Drug Screen completed in last 360 days      Passed - Patient is not pregnant      Passed - Valid encounter within last 6 months    Recent  Outpatient Visits           1 month ago Morbid obesity (HCC)   [place] [facility] Larae Grooms, NP   5 months ago Morbid obesity [facility])   [place] [facility] Larae Grooms, NP   6 months ago Morbid obesity [facility])   [place] [facility] Larae Grooms, NP   6 months ago Annual physical exam   [place] [facility]ae [facility], NP   7 months ago Anxiety   [place] [facility] Larae Grooms, NP       Future Appointments             In 3 weeks Larae Grooms, NP [place] [facility]   In 1 month Deirdre Evener, MD [facility]

## 2023-10-06 ENCOUNTER — Encounter: Payer: Self-pay | Admitting: Nurse Practitioner

## 2023-10-07 ENCOUNTER — Encounter: Payer: Self-pay | Admitting: Nurse Practitioner

## 2023-10-07 ENCOUNTER — Ambulatory Visit (INDEPENDENT_AMBULATORY_CARE_PROVIDER_SITE_OTHER): Payer: No Typology Code available for payment source | Admitting: Nurse Practitioner

## 2023-10-07 DIAGNOSIS — Z111 Encounter for screening for respiratory tuberculosis: Secondary | ICD-10-CM | POA: Diagnosis not present

## 2023-10-07 DIAGNOSIS — Z113 Encounter for screening for infections with a predominantly sexual mode of transmission: Secondary | ICD-10-CM

## 2023-10-07 MED ORDER — WEGOVY 2.4 MG/0.75ML ~~LOC~~ SOAJ: 2.4000 mg | SUBCUTANEOUS | 1 refills | Status: DC

## 2023-10-07 NOTE — Progress Notes (Addendum)
BP 138/88 (BP Location: Left Arm, Patient Position: Sitting, Cuff Size: Large)   Pulse 98   Temp 97.6 F (36.4 C) (Oral)   Ht 5\' 3"  (1.6 m)   Wt 186 lb (84.4 kg)   SpO2 97%   BMI 32.95 kg/m    Subjective:    Patient ID: Victoria Shepard, female    DOB: 1971-07-09, 52 y.o.   MRN: [id number]  HPI: Victoria Shepard is a 52 y.o. female  Chief Complaint  Patient presents with   2 month follow up   Weight Management Screening   WEIGHT GAIN Has lost 6 more lbs since increasing the dose of the medication.  She has lost a total of 32lbs since starting Wegovy.  Does have constipation- which is controlled with her Linzess.  Her insurance will no longer cover the medication after January 1.  Duration: years Previous attempts at weight loss: yes Complications of obesity: prediabetes, HTN, HLD Peak weight: 225lb Weight loss goal: 165lb Weight loss to date:  Requesting obesity pharmacotherapy: yes Current weight loss supplements/medications: yes Previous weight loss supplements/meds: yes Calories:   Would like have STI testing but is not having any symptoms.     Relevant past medical, surgical, family and social history reviewed and updated as indicated. Interim medical history since our last visit reviewed. Allergies and medications reviewed and updated.  Review of Systems  Constitutional:  Negative for unexpected weight change.  Gastrointestinal:  Positive for constipation and diarrhea.    Per HPI unless specifically indicated above     Objective:    BP 138/88 (BP Location: Left Arm, Patient Position: Sitting, Cuff Size: Large)   Pulse 98   Temp 97.6 F (36.4 C) (Oral)   Ht 5\' 3"  (1.6 m)   Wt 186 lb (84.4 kg)   SpO2 97%   BMI 32.95 kg/m   Wt Readings from Last 3 Encounters:  10/07/23 186 lb (84.4 kg)  08/05/23 193 lb 9.6 oz (87.8 kg)  04/04/23 210 lb 12.8 oz (95.6 kg)    Physical Exam Vitals and nursing note reviewed.  Constitutional:      General: She is not in  acute distress.    Appearance: Normal appearance. She is obese. She is not ill-appearing, toxic-appearing or diaphoretic.  HENT:     Head: Normocephalic.     Right Ear: External ear normal.     Left Ear: External ear normal.     Nose: Nose normal.     Mouth/Throat:     Mouth: Mucous membranes are moist.     Pharynx: Oropharynx is clear.  Eyes:     General:        Right eye: No discharge.        Left eye: No discharge.     Extraocular Movements: Extraocular movements intact.     Conjunctiva/sclera: Conjunctivae normal.     Pupils: Pupils are equal, round, and reactive to light.  Cardiovascular:     Rate and Rhythm: Normal rate and regular rhythm.     Heart sounds: No murmur heard. Pulmonary:     Effort: Pulmonary effort is normal. No respiratory distress.     Breath sounds: Normal breath sounds. No wheezing or rales.  Musculoskeletal:     Cervical back: Normal range of motion and neck supple.  Skin:    General: Skin is warm and dry.     Capillary Refill: Capillary refill takes less than 2 seconds.  Neurological:     General: No focal  deficit present.     Mental Status: [name] and oriented to person, place, and time. Mental status is at baseline.  Psychiatric:        Mood and Affect: Mood normal.        Behavior: Behavior normal.        Thought Content: Thought content normal.        Judgment: Judgment normal.     Results for orders placed or performed in visit on 02/25/23  Urinalysis, Routine w reflex microscopic   Collection Time: 02/25/23  9:23 AM  Result Value Ref Range   Specific Gravity, UA >1.030 (H) 1.005 - 1.030   pH, UA 5.5 5.0 - 7.5   Color, UA Yellow Yellow   Appearance Ur Clear Clear   Leukocytes,UA Negative Negative   Protein,UA Trace (A) Negative/Trace   Glucose, UA Negative Negative   Ketones, UA Negative Negative   RBC, UA Negative Negative   Bilirubin, UA Negative Negative   Urobilinogen, Ur 1.0 0.2 - 1.0 mg/dL   Nitrite, UA Negative  Negative   Microscopic Examination Comment   CBC with Differential/Platelet   Collection Time: 02/25/23  9:27 AM  Result Value Ref Range   WBC 7.7 3.4 - 10.8 x10E3/uL   RBC 5.08 3.77 - 5.28 x10E6/uL   Hemoglobin 15.0 11.1 - 15.9 g/dL   Hematocrit 08.6 57.8 - 46.6 %   MCV 91 79 - 97 fL   MCH 29.5 26.6 - 33.0 pg   MCHC 32.6 31.5 - 35.7 g/dL   RDW 46.9 62.9 - 52.8 %   Platelets 381 150 - 450 x10E3/uL   Neutrophils 65 Not Estab. %   Lymphs 24 Not Estab. %   Monocytes 8 Not Estab. %   Eos 2 Not Estab. %   Basos 1 Not Estab. %   Neutrophils Absolute 5.1 1.4 - 7.0 x10E3/uL   Lymphocytes Absolute 1.8 0.7 - 3.1 x10E3/uL   Monocytes Absolute 0.6 0.1 - 0.9 x10E3/uL   EOS (ABSOLUTE) 0.2 0.0 - 0.4 x10E3/uL   Basophils Absolute 0.1 0.0 - 0.2 x10E3/uL   Immature Granulocytes 0 Not Estab. %   Immature Grans (Abs) 0.0 0.0 - 0.1 x10E3/uL  Comprehensive metabolic panel   Collection Time: 02/25/23  9:27 AM  Result Value Ref Range   Glucose 104 (H) 70 - 99 mg/dL   BUN 7 6 - 24 mg/dL   Creatinine, Ser 4.13 0.57 - 1.00 mg/dL   eGFR 83 >24 MW/NUU/7.25   BUN/Creatinine Ratio 8 (L) 9 - 23   Sodium 141 134 - 144 mmol/L   Potassium 4.4 3.5 - 5.2 mmol/L   Chloride 104 96 - 106 mmol/L   CO2 21 20 - 29 mmol/L   Calcium 9.3 8.7 - 10.2 mg/dL   Total Protein 7.4 6.0 - 8.5 g/dL   Albumin 4.5 3.9 - 4.9 g/dL   Globulin, Total 2.9 1.5 - 4.5 g/dL   Albumin/Globulin Ratio 1.6 1.2 - 2.2   Bilirubin Total 1.5 (H) 0.0 - 1.2 mg/dL   Alkaline Phosphatase 73 44 - 121 IU/L   AST 24 0 - 40 IU/L   ALT 27 0 - 32 IU/L  Lipid panel   Collection Time: 02/25/23  9:27 AM  Result Value Ref Range   Cholesterol, Total 125 100 - 199 mg/dL   Triglycerides 366 0 - 149 mg/dL   HDL 39 (L) >44 mg/dL   VLDL Cholesterol Cal 19 5 - 40 mg/dL   LDL Chol Calc (NIH) 67  0 - 99 mg/dL   Chol/HDL Ratio 3.2 0.0 - 4.4 ratio  TSH   Collection Time: 02/25/23  9:27 AM  Result Value Ref Range   TSH 1.770 0.450 - 4.500 uIU/mL  HgB A1c    Collection Time: 02/25/23  9:27 AM  Result Value Ref Range   Hgb A1c MFr Bld 6.0 (H) 4.8 - 5.6 %   Est. average glucose Bld gHb Est-mCnc 126 mg/dL  Hepatitis C Antibody   Collection Time: 02/25/23  9:27 AM  Result Value Ref Range   Hep C Virus Ab Non Reactive Non Reactive  HIV Antibody (routine testing w rflx)   Collection Time: 02/25/23  9:27 AM  Result Value Ref Range   HIV Screen 4th Generation wRfx Non Reactive Non Reactive  Cologuard   Collection Time: 02/28/23  2:58 PM  Result Value Ref Range   COLOGUARD Negative Negative      Assessment & Plan:   Problem List Items Addressed This Visit       Other   Morbid obesity (HCC) - Primary   Chronic.  Improved.  She has lost another 6lbs since her last visit.  She is doing well with her weight loss.  Does use Zofran PRN for nausea.  Continues to have constipation which is controlled with Linzess.  Her insurance will no longer cover the medication in January.        Relevant Medications   Semaglutide-Weight Management (WEGOVY) 2.4 MG/0.75ML SOAJ   Other Relevant Orders   Comp Met (CMET)   Other Visit Diagnoses       Screening examination for STI       Relevant Orders   HIV Antibody (routine testing w rflx)   RPR   Hepatitis C antibody   Chlamydia/Gonococcus/Trichomonas, NAA     Tuberculosis screening       Relevant Orders   QuantiFERON-TB Gold Plus         Follow up plan: Return in about 3 months (around 01/05/2024) for Weight Managment.  A total of 30 minutes were spent on this encounter today.  When total time is documented, this includes both the face-to-face and non-face-to-face time personally spent before, during and after the visit on the date of the encounter symptoms, plan of care and follow up.

## 2023-10-07 NOTE — Assessment & Plan Note (Signed)
Chronic.  Improved.  She has lost another 6lbs since her last visit.  She is doing well with her weight loss.  Does use Zofran PRN for nausea.  Continues to have constipation which is controlled with Linzess.  Her insurance will no longer cover the medication in January.

## 2023-10-09 LAB — CHLAMYDIA/GONOCOCCUS/TRICHOMONAS, NAA
Chlamydia by NAA: NEGATIVE
Gonococcus by NAA: NEGATIVE
Trich vag by NAA: NEGATIVE

## 2023-10-10 ENCOUNTER — Encounter: Payer: Self-pay | Admitting: Nurse Practitioner

## 2023-10-10 ENCOUNTER — Telehealth: Payer: Self-pay

## 2023-10-10 NOTE — Telephone Encounter (Signed)
PA for [facility] initiated and submitted via Cover My Meds. Key: [id number]

## 2023-10-10 NOTE — Telephone Encounter (Signed)
PA approved. Patient notified via mychart message.

## 2023-10-13 LAB — COMPREHENSIVE METABOLIC PANEL
ALT: 17 [IU]/L (ref 0–32)
AST: 21 [IU]/L (ref 0–40)
Albumin: 4.5 g/dL (ref 3.8–4.9)
Alkaline Phosphatase: 80 [IU]/L (ref 44–121)
BUN/Creatinine Ratio: 7 — ABNORMAL LOW (ref 9–23)
BUN: 6 mg/dL (ref 6–24)
Bilirubin Total: 0.9 mg/dL (ref 0.0–1.2)
CO2: 18 mmol/L — ABNORMAL LOW (ref 20–29)
Calcium: 9.3 mg/dL (ref 8.7–10.2)
Chloride: 101 mmol/L (ref 96–106)
Creatinine, Ser: 0.81 mg/dL (ref 0.57–1.00)
Globulin, Total: 2.6 g/dL (ref 1.5–4.5)
Glucose: 91 mg/dL (ref 70–99)
Potassium: 4.4 mmol/L (ref 3.5–5.2)
Sodium: 138 mmol/L (ref 134–144)
Total Protein: 7.1 g/dL (ref 6.0–8.5)
eGFR: 88 mL/min/{1.73_m2} (ref >59)

## 2023-10-13 LAB — HIV ANTIBODY (ROUTINE TESTING W REFLEX): HIV Screen 4th Generation wRfx: NONREACTIVE

## 2023-10-13 LAB — QUANTIFERON-TB GOLD PLUS
QuantiFERON Mitogen Value: >10 [IU]/mL
QuantiFERON Nil Value: 0.05 [IU]/mL
QuantiFERON TB1 Ag Value: 0.06 [IU]/mL
QuantiFERON TB2 Ag Value: 0.06 [IU]/mL
QuantiFERON-TB Gold Plus: NEGATIVE

## 2023-10-13 LAB — HEPATITIS C ANTIBODY: Hep C Virus Ab: NONREACTIVE

## 2023-10-13 LAB — RPR: RPR Ser Ql: NONREACTIVE

## 2023-10-14 ENCOUNTER — Encounter: Payer: Self-pay | Admitting: Nurse Practitioner

## 2023-10-14 ENCOUNTER — Other Ambulatory Visit: Payer: Self-pay | Admitting: Physician Assistant

## 2023-10-15 ENCOUNTER — Encounter: Payer: Self-pay | Admitting: Nurse Practitioner

## 2023-10-15 MED ORDER — CLONAZEPAM 0.5 MG PO TABS: 0.5000 mg | ORAL_TABLET | Freq: Every day | ORAL | 0 refills | Status: DC

## 2023-10-21 ENCOUNTER — Ambulatory Visit: Payer: No Typology Code available for payment source | Admitting: Dermatology

## 2023-10-29 ENCOUNTER — Ambulatory Visit: Payer: No Typology Code available for payment source | Admitting: Dermatology

## 2023-11-04 ENCOUNTER — Encounter: Payer: Self-pay | Admitting: Nurse Practitioner

## 2023-11-04 ENCOUNTER — Other Ambulatory Visit: Payer: Self-pay | Admitting: Nurse Practitioner

## 2023-11-04 MED ORDER — ONDANSETRON 8 MG PO TBDP: 8.0000 mg | ORAL_TABLET | Freq: Three times a day (TID) | ORAL | 1 refills | Status: DC | PRN

## 2023-11-04 NOTE — Telephone Encounter (Signed)
Requested Prescriptions  Pending Prescriptions Disp Refills   rosuvastatin (CRESTOR) 20 MG tablet [Pharmacy Med Name: ROSUVASTATIN CALCIUM 20 MG TAB] 90 tablet 0    Sig: TAKE 1 TABLET BY MOUTH EVERY DAY     Cardiovascular:  Antilipid - Statins 2 Failed - 11/04/2023  3:03 PM      Failed - Lipid Panel in normal range within the last 12 months    Cholesterol, Total  Date Value Ref Range Status  02/25/2023 125 100 - 199 mg/dL Final   LDL Chol Calc (NIH)  Date Value Ref Range Status  02/25/2023 67 0 - 99 mg/dL Final   HDL  Date Value Ref Range Status  02/25/2023 39 (L) >39 mg/dL Final   Triglycerides  Date Value Ref Range Status  02/25/2023 101 0 - 149 mg/dL Final         Passed - Cr in normal range and within 360 days    Creatinine  Date Value Ref Range Status  06/18/2014 1.16 0.60 - 1.30 mg/dL Final   Creatinine, Ser  Date Value Ref Range Status  10/07/2023 0.81 0.57 - 1.00 mg/dL Final         Passed - Patient is not pregnant      Passed - Valid encounter within last 12 months    Recent Outpatient Visits           4 weeks ago Morbid obesity (HCC)   [place] [facility]ae [facility], NP   3 months ago Morbid obesity [facility])   [place] [facility] Larae Grooms, NP   7 months ago Morbid obesity [facility])   [place] [facility] Larae G[facility], NP   7 months ago Morbid obesity (HCC)   [place] [facility] Larae Grooms, NP   8 months ago Annual physical exam   [place] [facility] Larae Grooms, NP       Future Appointments             In 2 months Larae Grooms, NP [place] [facility]             lisinopril (ZESTRIL) 20 MG tablet [Pharmacy Med Name: LISINOPRIL 20 MG TABLET] 90 tablet 0    Sig: TAKE 1 TABLET BY MOUTH EVERY DAY     Cardiovascular:  ACE Inhibitors Passed - 11/04/2023  3:03 PM      Passed - Cr in normal range and within 180  days    Creatinine  Date Value Ref Range Status  06/18/2014 1.16 0.60 - 1.30 mg/dL Final   Creatinine, Ser  Date Value Ref Range Status  10/07/2023 0.81 0.57 - 1.00 mg/dL Final         Passed - K in normal range and within 180 days    Potassium  Date Value Ref Range Status  10/07/2023 4.4 3.5 - 5.2 mmol/L Final  06/18/2014 3.9 3.5 - 5.1 mmol/L Final         Passed - Patient is not pregnant      Passed - Last BP in normal range    BP Readings from Last 1 Encounters:  10/07/23 138/88         Passed - Valid encounter within last 6 months    Recent Outpatient Visits           4 weeks ago Morbid obesity [facility])   [place] [facility] Lar[facility] NP   3 months ago Morbid obesity (HCC)  [place] [facility], NP   7 months ago Morbid obesity [facility])   [place] [facility] Lar[facility] G[facility], NP   7 months ago Morbid obesity [facility])   [place] [facility] Larae Grooms, NP   8 months ago Annual physical exam   [place] [facility] Larae Grooms, NP       Future Appointments             In 2 months Larae Grooms, NP [place] [facility]

## 2023-11-08 ENCOUNTER — Other Ambulatory Visit: Payer: Self-pay

## 2023-11-08 ENCOUNTER — Ambulatory Visit: Payer: Self-pay

## 2023-11-08 ENCOUNTER — Emergency Department
Admission: EM | Admit: 2023-11-08 | Discharge: 2023-11-08 | Disposition: A | Discharge status: Home / Self Care | Payer: No Typology Code available for payment source | Attending: Emergency Medicine | Admitting: Emergency Medicine

## 2023-11-08 ENCOUNTER — Encounter: Payer: Self-pay | Admitting: Medical Oncology

## 2023-11-08 ENCOUNTER — Emergency Department: Payer: No Typology Code available for payment source

## 2023-11-08 DIAGNOSIS — I10 Essential (primary) hypertension: Secondary | ICD-10-CM | POA: Diagnosis not present

## 2023-11-08 DIAGNOSIS — T59811A Toxic effect of smoke, accidental (unintentional), initial encounter: Secondary | ICD-10-CM | POA: Diagnosis present

## 2023-11-08 LAB — BASIC METABOLIC PANEL
Anion gap: 13 (ref 5–15)
BUN: 8 mg/dL (ref 6–20)
CO2: 24 mmol/L (ref 22–32)
Calcium: 9.1 mg/dL (ref 8.9–10.3)
Chloride: 103 mmol/L (ref 98–111)
Creatinine, Ser: 0.86 mg/dL (ref 0.44–1.00)
GFR, Estimated: >60 mL/min (ref >60)
Glucose, Bld: 122 mg/dL — ABNORMAL HIGH (ref 70–99)
Potassium: 3.5 mmol/L (ref 3.5–5.1)
Sodium: 140 mmol/L (ref 135–145)

## 2023-11-08 LAB — CBC WITH DIFFERENTIAL/PLATELET
Abs Immature Granulocytes: 0.02 10*3/uL (ref 0.00–0.07)
Basophils Absolute: 0 10*3/uL (ref 0.0–0.1)
Basophils Relative: 1 %
Eosinophils Absolute: 0 10*3/uL (ref 0.0–0.5)
Eosinophils Relative: 1 %
HCT: 44.3 % (ref 36.0–46.0)
Hemoglobin: 14.6 g/dL (ref 12.0–15.0)
Immature Granulocytes: 0 %
Lymphocytes Relative: 18 %
Lymphs Abs: 1.3 10*3/uL (ref 0.7–4.0)
MCH: 29.3 pg (ref 26.0–34.0)
MCHC: 33 g/dL (ref 30.0–36.0)
MCV: 89 fL (ref 80.0–100.0)
Monocytes Absolute: 0.4 10*3/uL (ref 0.1–1.0)
Monocytes Relative: 6 %
Neutro Abs: 5.5 10*3/uL (ref 1.7–7.7)
Neutrophils Relative %: 74 %
Platelets: 304 10*3/uL (ref 150–400)
RBC: 4.98 MIL/uL (ref 3.87–5.11)
RDW: 12.7 % (ref 11.5–15.5)
WBC: 7.2 10*3/uL (ref 4.0–10.5)
nRBC: 0 % (ref 0.0–0.2)

## 2023-11-08 LAB — COOXEMETRY PANEL
Carboxyhemoglobin: 0.8 % (ref 0.5–1.5)
Methemoglobin: <0.7 % (ref 0.0–1.5)
O2 Saturation: 35.8 %
Total hemoglobin: 15.1 g/dL (ref 12.0–16.0)
Total oxygen content: 35.4 %

## 2023-11-08 MED ORDER — EYE WASH OP SOLN: 2.0000 [drp] | OPHTHALMIC | Status: DC | PRN
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
## 2023-11-11 ENCOUNTER — Encounter: Payer: Self-pay | Admitting: Nurse Practitioner

## 2023-11-18 ENCOUNTER — Encounter: Payer: Self-pay | Admitting: Nurse Practitioner

## 2023-12-07 ENCOUNTER — Other Ambulatory Visit: Payer: Self-pay | Admitting: Dermatology

## 2023-12-18 ENCOUNTER — Ambulatory Visit
Admission: RE | Admit: 2023-12-18 | Discharge: 2023-12-18 | Disposition: A | Discharge status: Home / Self Care | Attending: Nurse Practitioner | Admitting: Nurse Practitioner

## 2023-12-18 ENCOUNTER — Encounter: Payer: Self-pay | Admitting: Nurse Practitioner

## 2023-12-18 ENCOUNTER — Ambulatory Visit
Admission: RE | Admit: 2023-12-18 | Discharge: 2023-12-18 | Disposition: A | Discharge status: Home / Self Care | Source: Ambulatory Visit | Attending: Nurse Practitioner | Admitting: Nurse Practitioner

## 2023-12-18 ENCOUNTER — Ambulatory Visit: Admitting: Nurse Practitioner

## 2023-12-18 VITALS — BP 115/80 | HR 96 | Ht 63.0 in | Wt 180.4 lb

## 2023-12-18 DIAGNOSIS — M545 Low back pain, unspecified: Secondary | ICD-10-CM

## 2023-12-18 LAB — URINALYSIS, ROUTINE W REFLEX MICROSCOPIC
Bilirubin, UA: NEGATIVE
Glucose, UA: NEGATIVE
Ketones, UA: NEGATIVE
Nitrite, UA: NEGATIVE
Protein,UA: NEGATIVE
RBC, UA: NEGATIVE
Specific Gravity, UA: 1.02 (ref 1.005–1.030)
Urobilinogen, Ur: 1 mg/dL (ref 0.2–1.0)
pH, UA: 6 (ref 5.0–7.5)

## 2023-12-18 LAB — MICROSCOPIC EXAMINATION

## 2023-12-18 NOTE — Progress Notes (Signed)
 BP 115/80 (BP Location: Left Arm, Patient Position: Sitting, Cuff Size: Large)   Pulse 96   Ht 5\' 3"  (1.6 m)   Wt 180 lb 6.4 oz (81.8 kg)   SpO2 99%   BMI 31.96 kg/m    Subjective:    Patient ID: Victoria Shepard, female    DOB: 10-12-1971, 53 y.o.   MRN: [id number]  HPI: Victoria Shepard is a 53 y.o. female  Chief Complaint  Patient presents with   Back Pain    Has pain in the lower back, left side that radiates to the left side pelvic area, has to take 2 Klonopin and 2 tylenol to help relieve pain   Urinary Frequency   Patient states she worked last week 7 days in a row.  States she wasn't drinking much water and not taking many bathroom breaks.  States on Monday she felt like she had to pee and then felt like she would have to go.  She is having back pain that radiates to the lower left pelvic area.  Patient is having nausea and having to use zofran.  She did have some vomiting.  Feels like she has to poop but nothing comes out.  She does have doxycyline for her skin but hasn't been taking it as much.  States she has been constipated for the last week.  Symptoms do improve some when she has a bowel movement.     Relevant past medical, surgical, family and social history reviewed and updated as indicated. Interim medical history since our last visit reviewed. Allergies and medications reviewed and updated.  Review of Systems  Gastrointestinal:  Positive for abdominal pain, constipation, nausea and vomiting.  Genitourinary:  Positive for difficulty urinating.  Musculoskeletal:  Positive for back pain.    Per HPI unless specifically indicated above     Objective:    BP 115/80 (BP Location: Left Arm, Patient Position: Sitting, Cuff Size: Large)   Pulse 96   Ht 5\' 3"  (1.6 m)   Wt 180 lb 6.4 oz (81.8 kg)   SpO2 99%   BMI 31.96 kg/m   Wt Readings from Last 3 Encounters:  12/18/23 180 lb 6.4 oz (81.8 kg)  11/08/23 182 lb (82.6 kg)  10/07/23 186 lb (84.4 kg)    Physical  Exam Vitals and nursing note reviewed.  Constitutional:      General: She is not in acute distress.    Appearance: Normal appearance. She is normal weight. She is not ill-appearing, toxic-appearing or diaphoretic.  HENT:     Head: Normocephalic.     Right Ear: External ear normal.     Left Ear: External ear normal.     Nose: Nose normal.     Mouth/Throat:     Mouth: Mucous membranes are moist.     Pharynx: Oropharynx is clear.  Eyes:     General:        Right eye: No discharge.        Left eye: No discharge.     Extraocular Movements: Extraocular movements intact.     Conjunctiva/sclera: Conjunctivae normal.     Pupils: Pupils are equal, round, and reactive to light.  Cardiovascular:     Rate and Rhythm: Normal rate and regular rhythm.     Heart sounds: No murmur heard. Pulmonary:     Effort: Pulmonary effort is normal. No respiratory distress.     Breath sounds: Normal breath sounds. No wheezing or rales.  Abdominal:  General: Abdomen is flat. Bowel sounds are normal. There is no distension.     Palpations: Abdomen is soft. There is no mass.     Tenderness: There is abdominal tenderness (RLQ and LLQ). There is left CVA tenderness. There is no right CVA tenderness, guarding or rebound.     Hernia: No hernia is present.  Musculoskeletal:     Cervical back: Normal range of motion and neck supple.  Skin:    General: Skin is warm and dry.     Capillary Refill: Capillary refill takes less than 2 seconds.  Neurological:     General: No focal deficit present.     Mental Status: [name] and oriented to person, place, and time. Mental status is at baseline.  Psychiatric:        Mood and Affect: Mood normal.        Behavior: Behavior normal.        Thought Content: Thought content normal.        Judgment: Judgment normal.     Results for orders placed or performed during the hospital encounter of 11/08/23  CBC with Differential   Collection Time: 11/08/23 10:29 AM   Result Value Ref Range   WBC 7.2 4.0 - 10.5 K/uL   RBC 4.98 3.87 - 5.11 MIL/uL   Hemoglobin 14.6 12.0 - 15.0 g/dL   HCT 16.1 09.6 - 04.5 %   MCV 89.0 80.0 - 100.0 fL   MCH 29.3 26.0 - 34.0 pg   MCHC 33.0 30.0 - 36.0 g/dL   RDW 40.9 81.1 - 91.4 %   Platelets 304 150 - 400 K/uL   nRBC 0.0 0.0 - 0.2 %   Neutrophils Relative % 74 %   Neutro Abs 5.5 1.7 - 7.7 K/uL   Lymphocytes Relative 18 %   Lymphs Abs 1.3 0.7 - 4.0 K/uL   Monocytes Relative 6 %   Monocytes Absolute 0.4 0.1 - 1.0 K/uL   Eosinophils Relative 1 %   Eosinophils Absolute 0.0 0.0 - 0.5 K/uL   Basophils Relative 1 %   Basophils Absolute 0.0 0.0 - 0.1 K/uL   Immature Granulocytes 0 %   Abs Immature Granulocytes 0.02 0.00 - 0.07 K/uL  Basic metabolic panel   Collection Time: 11/08/23 10:29 AM  Result Value Ref Range   Sodium 140 135 - 145 mmol/L   Potassium 3.5 3.5 - 5.1 mmol/L   Chloride 103 98 - 111 mmol/L   CO2 24 22 - 32 mmol/L   Glucose, Bld 122 (H) 70 - 99 mg/dL   BUN 8 6 - 20 mg/dL   Creatinine, Ser 7.82 0.44 - 1.00 mg/dL   Calcium 9.1 8.9 - 95.6 mg/dL   GFR, Estimated >21 >30 mL/min   Anion gap 13 5 - 15  Cooxemetry Panel, hospital-performed (carboxy, met, total hgb, O2 sat)   Collection Time: 11/08/23 11:00 AM  Result Value Ref Range   Total hemoglobin 15.1 12.0 - 16.0 g/dL   O2 Saturation 86.5 %   Carboxyhemoglobin 0.8 0.5 - 1.5 %   Methemoglobin <0.7 0.0 - 1.5 %   Total oxygen content 35.4 %      Assessment & Plan:   Problem List Items Addressed This Visit   None Visit Diagnoses       Acute left-sided low back pain without sciatica    -  Primary Suspect pain is related to constipation vs possible kidney stone.  UA showed trace Leuks in office. No red flags on  exam. Will order KUB. Will make further recommendations based on results.    Relevant Orders   Urinalysis, Routine w reflex microscopic   DG Abd 1 View   Comp Met (CMET)   CBC w/Diff   Urine Culture        Follow up plan: Return  if symptoms worsen or fail to improve.    A total of 30 minutes were spent on this encounter today.  When total time is documented, this includes both the face-to-face and non-face-to-face time personally spent before, during and after the visit on the date of the encounter examining patient, developing a plan, patient education.

## 2023-12-19 ENCOUNTER — Encounter: Payer: Self-pay | Admitting: Nurse Practitioner

## 2023-12-19 ENCOUNTER — Ambulatory Visit: Payer: Self-pay | Admitting: Nurse Practitioner

## 2023-12-19 LAB — COMPREHENSIVE METABOLIC PANEL
ALT: 16 IU/L (ref 0–32)
AST: 18 IU/L (ref 0–40)
Albumin: 4.3 g/dL (ref 3.8–4.9)
Alkaline Phosphatase: 66 IU/L (ref 44–121)
BUN/Creatinine Ratio: 10 (ref 9–23)
BUN: 9 mg/dL (ref 6–24)
Bilirubin Total: 0.9 mg/dL (ref 0.0–1.2)
CO2: 21 mmol/L (ref 20–29)
Calcium: 8.9 mg/dL (ref 8.7–10.2)
Chloride: 107 mmol/L — ABNORMAL HIGH (ref 96–106)
Creatinine, Ser: 0.91 mg/dL (ref 0.57–1.00)
Globulin, Total: 2.5 g/dL (ref 1.5–4.5)
Glucose: 70 mg/dL (ref 70–99)
Potassium: 4 mmol/L (ref 3.5–5.2)
Sodium: 145 mmol/L — ABNORMAL HIGH (ref 134–144)
Total Protein: 6.8 g/dL (ref 6.0–8.5)
eGFR: 76 mL/min/{1.73_m2} (ref >59)

## 2023-12-19 LAB — CBC WITH DIFFERENTIAL/PLATELET
Basophils Absolute: 0.1 10*3/uL (ref 0.0–0.2)
Basos: 1 %
EOS (ABSOLUTE): 0.1 10*3/uL (ref 0.0–0.4)
Eos: 2 %
Hematocrit: 42.6 % (ref 34.0–46.6)
Hemoglobin: 14 g/dL (ref 11.1–15.9)
Immature Grans (Abs): 0 10*3/uL (ref 0.0–0.1)
Immature Granulocytes: 0 %
Lymphocytes Absolute: 2.3 10*3/uL (ref 0.7–3.1)
Lymphs: 36 %
MCH: 29.7 pg (ref 26.6–33.0)
MCHC: 32.9 g/dL (ref 31.5–35.7)
MCV: 90 fL (ref 79–97)
Monocytes Absolute: 0.4 10*3/uL (ref 0.1–0.9)
Monocytes: 7 %
Neutrophils Absolute: 3.4 10*3/uL (ref 1.4–7.0)
Neutrophils: 54 %
Platelets: 285 10*3/uL (ref 150–450)
RBC: 4.71 x10E6/uL (ref 3.77–5.28)
RDW: 12.3 % (ref 11.7–15.4)
WBC: 6.4 10*3/uL (ref 3.4–10.8)

## 2023-12-19 MED ORDER — LINACLOTIDE 290 MCG PO CAPS: 290.0000 ug | ORAL_CAPSULE | Freq: Every day | ORAL | 0 refills | Status: DC

## 2023-12-19 NOTE — Telephone Encounter (Signed)
 Routing to provider to advise. Call was placed to radiology to have imaging read ASAP due to the patient's pain.

## 2023-12-19 NOTE — Addendum Note (Signed)
 Addended by: Larae Grooms on: 12/19/2023 06:15 PM   Modules accepted: Orders

## 2023-12-19 NOTE — Telephone Encounter (Signed)
 Copied from CRM [id number]. Topic: Clinical - Red Word Triage >> Dec 19, 2023  2:28 PM Victoria Shepard wrote: Red Word that prompted transfer to Nurse Triage: severe pain/kidney stones   Chief Complaint: Flank pain  Symptoms: Left flank pain, nausea  Frequency: Intermittent  Disposition: [] ED /[] Urgent Care (no appt availability in office) / [] Appointment(In office/virtual)/ []  [facility]/ [] Home Care/ [] Refused Recommended Disposition /[] [facility]/ [x]  Follow-up with PCP Additional Notes: Patient seen yesterday for left flank pain and is waiting for her x-ray to be read to be prescribed medication. Patient states that her pain is 10/10 and is requesting something to help take the edge off the pain while waiting for the results. Patient denies any changes in her symptoms since yesterday.     Reason for Disposition  Pain radiates into groin, scrotum    No appointment, seen yesterday by PCP, requesting something for pain  Answer Assessment - Initial Assessment Questions 1. LOCATION: "Where does it hurt?" (e.g., left, right)     Left flank  2. ONSET: "When did the pain start?"     4 days ago  3. SEVERITY: "How bad is the pain?" (e.g., Scale 1-10; mild, moderate, or severe)   - MILD (1-3): doesn't interfere with normal activities    - MODERATE (4-7): interferes with normal activities or awakens from sleep    - SEVERE (8-10): excruciating pain and patient unable to do normal activities (stays in bed)       10/10 4. PATTERN: "Does the pain come and go, or is it constant?"      Intermittent, constant for the last 1-2 hours  5. CAUSE: "What do you think is causing the pain?"     Believes it's a kidney stone  6. OTHER SYMPTOMS:  "Do you have any other symptoms?" (e.g., fever, abdomen pain, vomiting, leg weakness, burning with urination, blood in urine)     Nausea  Protocols used: Flank Pain-A-AH

## 2023-12-19 NOTE — Telephone Encounter (Signed)
 Responded to patient through result note.

## 2023-12-20 ENCOUNTER — Encounter: Payer: Self-pay | Admitting: Nurse Practitioner

## 2023-12-20 ENCOUNTER — Other Ambulatory Visit: Payer: Self-pay | Admitting: Nurse Practitioner

## 2023-12-20 MED ORDER — ONDANSETRON 8 MG PO TBDP: 8.0000 mg | ORAL_TABLET | Freq: Three times a day (TID) | ORAL | 1 refills | Status: DC | PRN

## 2023-12-20 MED ORDER — IBUPROFEN 600 MG PO TABS: 600.0000 mg | ORAL_TABLET | Freq: Three times a day (TID) | ORAL | 0 refills | Status: DC | PRN

## 2023-12-22 ENCOUNTER — Encounter: Payer: Self-pay | Admitting: Nurse Practitioner

## 2023-12-22 LAB — URINE CULTURE

## 2023-12-23 ENCOUNTER — Encounter: Payer: Self-pay | Admitting: Nurse Practitioner

## 2023-12-23 NOTE — Telephone Encounter (Signed)
 Pharmacy comment: Alternative Requested:THE PRESCRIBED MEDICATION IS NOT COVERED BY INSURANCE. PLEASE CONSIDER CHANGING TO ONE OF THE SUGGESTED COVERED ALTERNATIVES.

## 2023-12-24 ENCOUNTER — Telehealth: Admitting: Nurse Practitioner

## 2023-12-24 ENCOUNTER — Encounter: Payer: Self-pay | Admitting: Nurse Practitioner

## 2023-12-24 DIAGNOSIS — K5909 Other constipation: Secondary | ICD-10-CM | POA: Diagnosis not present

## 2023-12-24 DIAGNOSIS — R350 Frequency of micturition: Secondary | ICD-10-CM | POA: Diagnosis not present

## 2023-12-24 MED ORDER — TAMSULOSIN HCL 0.4 MG PO CAPS: 0.4000 mg | ORAL_CAPSULE | Freq: Every day | ORAL | 0 refills | Status: DC

## 2023-12-24 NOTE — Assessment & Plan Note (Signed)
 Improving.  Increased Linzess at last visit.  Continue with bowel clean out.  Follow up in 1 month.  Call sooner if concerns arise.

## 2023-12-24 NOTE — Progress Notes (Signed)
 There were no vitals taken for this visit.   Subjective:    Patient ID: Victoria Shepard, female    DOB: 10-10-1971, 53 y.o.   MRN: [id number]  HPI: Victoria Shepard is a 53 y.o. female  Chief Complaint  Patient presents with   Urinary Frequency    Still feels as if she has to urinate and nothing comes out   Constipation   CONSTIPATION Patient seen today via video visit for urinary frequency.  The back pain has resolved.  She had 3 bowel movements over the weekend and one yesterday and one today.  She does still have some nausea.  She is going to skip her Wegovy shot this week due to symptoms.  She doesn't have any pain with urination.  Just the constant need to go.    Relevant past medical, surgical, family and social history reviewed and updated as indicated. Interim medical history since our last visit reviewed. Allergies and medications reviewed and updated.  Review of Systems  Gastrointestinal:  Positive for constipation.  Genitourinary:  Positive for frequency. Negative for dysuria.    Per HPI unless specifically indicated above     Objective:    There were no vitals taken for this visit.  Wt Readings from Last 3 Encounters:  12/18/23 180 lb 6.4 oz (81.8 kg)  11/08/23 182 lb (82.6 kg)  10/07/23 186 lb (84.4 kg)    Physical Exam Vitals and nursing note reviewed.  Constitutional:      General: She is not in acute distress.    Appearance: She is not ill-appearing.  HENT:     Head: Normocephalic.     Right Ear: Hearing normal.     Left Ear: Hearing normal.     Nose: Nose normal.  Pulmonary:     Effort: Pulmonary effort is normal. No respiratory distress.  Neurological:     Mental Status: [name].  Psychiatric:        Mood and Affect: Mood normal.        Behavior: Behavior normal.        Thought Content: Thought content normal.        Judgment: Judgment normal.     Results for orders placed or performed in visit on 12/18/23  Microscopic Examination    Collection Time: 12/18/23 11:26 AM   Urine  Result Value Ref Range   WBC, UA 6-10 (A) 0 - 5 /hpf   RBC, Urine 0-2 0 - 2 /hpf   Epithelial Cells (non renal) 0-10 0 - 10 /hpf   Mucus, UA Present (A) Not Estab.   Bacteria, UA Moderate (A) None seen/Few  Urinalysis, Routine w reflex microscopic   Collection Time: 12/18/23 11:26 AM  Result Value Ref Range   Specific Gravity, UA 1.020 1.005 - 1.030   pH, UA 6.0 5.0 - 7.5   Color, UA Yellow Yellow   Appearance Ur Hazy (A) Clear   Leukocytes,UA Trace (A) Negative   Protein,UA Negative Negative/Trace   Glucose, UA Negative Negative   Ketones, UA Negative Negative   RBC, UA Negative Negative   Bilirubin, UA Negative Negative   Urobilinogen, Ur 1.0 0.2 - 1.0 mg/dL   Nitrite, UA Negative Negative   Microscopic Examination See below:   Urine Culture   Collection Time: 12/18/23 11:40 AM   Specimen: Urine   UR  Result Value Ref Range   Urine Culture, Routine Final report    Organism ID, Bacteria Lactobacillus species   Comp Met (  CMET)   Collection Time: 12/18/23  1:23 PM  Result Value Ref Range   Glucose 70 70 - 99 mg/dL   BUN 9 6 - 24 mg/dL   Creatinine, Ser 1.82 0.57 - 1.00 mg/dL   eGFR 76 >99 BZ/JIR/6.78   BUN/Creatinine Ratio 10 9 - 23   Sodium 145 (H) 134 - 144 mmol/L   Potassium 4.0 3.5 - 5.2 mmol/L   Chloride 107 (H) 96 - 106 mmol/L   CO2 21 20 - 29 mmol/L   Calcium 8.9 8.7 - 10.2 mg/dL   Total Protein 6.8 6.0 - 8.5 g/dL   Albumin 4.3 3.8 - 4.9 g/dL   Globulin, Total 2.5 1.5 - 4.5 g/dL   Bilirubin Total 0.9 0.0 - 1.2 mg/dL   Alkaline Phosphatase 66 44 - 121 IU/L   AST 18 0 - 40 IU/L   ALT 16 0 - 32 IU/L  CBC w/Diff   Collection Time: 12/18/23  1:23 PM  Result Value Ref Range   WBC 6.4 3.4 - 10.8 x10E3/uL   RBC 4.71 3.77 - 5.28 x10E6/uL   Hemoglobin 14.0 11.1 - 15.9 g/dL   Hematocrit 93.8 10.1 - 46.6 %   MCV 90 79 - 97 fL   MCH 29.7 26.6 - 33.0 pg   MCHC 32.9 31.5 - 35.7 g/dL   RDW 75.1 02.5 - 85.2 %   Platelets  285 150 - 450 x10E3/uL   Neutrophils 54 Not Estab. %   Lymphs 36 Not Estab. %   Monocytes 7 Not Estab. %   Eos 2 Not Estab. %   Basos 1 Not Estab. %   Neutrophils Absolute 3.4 1.4 - 7.0 x10E3/uL   Lymphocytes Absolute 2.3 0.7 - 3.1 x10E3/uL   Monocytes Absolute 0.4 0.1 - 0.9 x10E3/uL   EOS (ABSOLUTE) 0.1 0.0 - 0.4 x10E3/uL   Basophils Absolute 0.1 0.0 - 0.2 x10E3/uL   Immature Granulocytes 0 Not Estab. %   Immature Grans (Abs) 0.0 0.0 - 0.1 x10E3/uL      Assessment & Plan:   Problem List Items Addressed This Visit       Digestive   Chronic constipation - Primary   Improving.  Increased Linzess at last visit.  Continue with bowel clean out.  Follow up in 1 month.  Call sooner if concerns arise.       Other Visit Diagnoses       Urinary frequency       Will repeat UA.  Will also start Flomax to help pass stones.  Follow up if not improved.   Relevant Orders   Urinalysis, Routine w reflex microscopic        Follow up plan: No follow-ups on file.   This visit was completed via MyChart due to the restrictions of the COVID-19 pandemic. All issues as above were discussed and addressed. Physical exam was done as above through visual confirmation on MyChart. If it was felt that the patient should be evaluated in the office, they were directed there. The patient verbally consented to this visit. Location of the patient: Home Location of the provider: Office Those involved with this call:  Provider: Larae Grooms, NP CMA: Oswaldo Conroy, CMA Front Desk/Registration: Servando Snare This encounter was conducted via video.  I spent 20 dedicated to the care of this patient on the date of this encounter to include previsit review of symptoms, plan of care and follow up, face to face time with the patient, and post visit ordering of testing.

## 2023-12-25 ENCOUNTER — Encounter: Payer: Self-pay | Admitting: Nurse Practitioner

## 2023-12-25 ENCOUNTER — Other Ambulatory Visit

## 2023-12-25 DIAGNOSIS — R350 Frequency of micturition: Secondary | ICD-10-CM

## 2023-12-25 LAB — URINALYSIS, ROUTINE W REFLEX MICROSCOPIC
Bilirubin, UA: NEGATIVE
Glucose, UA: NEGATIVE
Ketones, UA: NEGATIVE
Leukocytes,UA: NEGATIVE
Nitrite, UA: NEGATIVE
Protein,UA: NEGATIVE
RBC, UA: NEGATIVE
Specific Gravity, UA: 1.02 (ref 1.005–1.030)
Urobilinogen, Ur: 1 mg/dL (ref 0.2–1.0)
pH, UA: 6.5 (ref 5.0–7.5)

## 2023-12-25 NOTE — Progress Notes (Unsigned)
 orde

## 2023-12-26 ENCOUNTER — Encounter: Payer: Self-pay | Admitting: Nurse Practitioner

## 2023-12-26 DIAGNOSIS — R350 Frequency of micturition: Secondary | ICD-10-CM

## 2023-12-27 ENCOUNTER — Other Ambulatory Visit: Payer: Self-pay | Admitting: Nurse Practitioner

## 2023-12-27 ENCOUNTER — Other Ambulatory Visit: Payer: Self-pay

## 2023-12-27 DIAGNOSIS — R350 Frequency of micturition: Secondary | ICD-10-CM

## 2023-12-30 NOTE — Telephone Encounter (Signed)
 Requested medication (s) are due for refill today - yes  Requested medication (s) are on the active medication list -yes  Future visit scheduled -yes  Last refill: 10/15/23 #30  Notes to clinic: non delegated Rx  Requested Prescriptions  Pending Prescriptions Disp Refills   clonazePAM (KLONOPIN) 0.5 MG tablet [Pharmacy Med Name: CLONAZEPAM 0.5 MG TABLET] 30 tablet 0    Sig: TAKE 1 TABLET BY MOUTH EVERY DAY     Not Delegated - Psychiatry: Anxiolytics/Hypnotics 2 Failed - 12/30/2023  9:55 AM      Failed - This refill cannot be delegated      Failed - Urine Drug Screen completed in last 360 days      Passed - Patient is not pregnant      Passed - Valid encounter within last 6 months    Recent Outpatient Visits           2 months ago Morbid obesity (HCC)   [place] [facility] [place] Larae Grooms, NP   4 months ago Morbid obesity [facility])   [place] [facility] Larae Grooms, NP   9 months ago Morbid obesity [facility])   [place] [facility] Larae Grooms, NP   9 months ago Morbid obesity [facility])   [place] [facility] Larae Grooms, NP   10 months ago Annual physical exam   [place] [facility] Larae Grooms, NP       Future Appointments             Tomorrow Richardo Hanks, Laurette Schimke, MD [facility] [place]   In 1 week Larae Grooms, NP [place] [facility]               Requested Prescriptions  Pending Prescriptions Disp Refills   clonazePAM (KLONOPIN) 0.5 MG tablet [Pharmacy Med Name: CLONAZEPAM 0.5 MG TABLET] 30 tablet 0    Sig: TAKE 1 TABLET BY MOUTH EVERY DAY     Not Delegated - Psychiatry: Anxiolytics/Hypnotics 2 Failed - 12/30/2023  9:55 AM      Failed - This refill cannot be delegated      Failed - Urine Drug Screen completed in last 360 days      Passed - Patient is not pregnant      Passed - Valid encounter within last 6 months    Recent  Outpatient Visits           2 months ago Morbid obesity (HCC)   [place] [facility] Larae Grooms, NP   4 months ago Morbid obesity [facility])   [place] [facility] Larae Grooms, NP   9 months ago Morbid obesity [facility])   [place] [facility] Larae Grooms, NP   9 months ago Morbid obesity [facility])   [place] [facility] Larae Grooms, NP   10 months ago Annual physical exam   [place] [facility] Larae Grooms, NP       Future Appointments             Tomorrow Richardo Hanks, Laurette Schimke, MD [facility] [place]   In 1 week Larae Grooms, NP  [place] [facility] [facility]

## 2023-12-31 ENCOUNTER — Ambulatory Visit (INDEPENDENT_AMBULATORY_CARE_PROVIDER_SITE_OTHER): Admitting: Urology

## 2023-12-31 ENCOUNTER — Other Ambulatory Visit
Admission: RE | Admit: 2023-12-31 | Discharge: 2023-12-31 | Disposition: A | Discharge status: Home / Self Care | Source: Home / Self Care | Attending: Urology | Admitting: Urology

## 2023-12-31 ENCOUNTER — Ambulatory Visit
Admission: RE | Admit: 2023-12-31 | Discharge: 2023-12-31 | Disposition: A | Discharge status: Home / Self Care | Source: Ambulatory Visit | Attending: Urology | Admitting: Urology

## 2023-12-31 ENCOUNTER — Encounter: Payer: Self-pay | Admitting: Urology

## 2023-12-31 VITALS — BP 138/84 | HR 89 | Ht 63.0 in | Wt 179.0 lb

## 2023-12-31 DIAGNOSIS — R350 Frequency of micturition: Secondary | ICD-10-CM

## 2023-12-31 DIAGNOSIS — R1032 Left lower quadrant pain: Secondary | ICD-10-CM | POA: Insufficient documentation

## 2023-12-31 LAB — URINALYSIS, COMPLETE (UACMP) WITH MICROSCOPIC
Bilirubin Urine: NEGATIVE
Glucose, UA: NEGATIVE mg/dL
Hgb urine dipstick: NEGATIVE
Ketones, ur: NEGATIVE mg/dL
Leukocytes,Ua: NEGATIVE
Nitrite: NEGATIVE
Protein, ur: NEGATIVE mg/dL
Specific Gravity, Urine: 1.015 (ref 1.005–1.030)
pH: 5.5 (ref 5.0–8.0)

## 2023-12-31 LAB — BLADDER SCAN AMB NON-IMAGING: Scan Result: 37

## 2023-12-31 NOTE — Progress Notes (Unsigned)
 12/31/23 11:26 AM   Victoria Shepard 24-Sep-1971 [id number]  CC: Urinary frequency, low back pain  HPI: 53 year old female who reports 3 weeks of urinary frequency.  This started about 3 weeks ago when she had severe left lower back pain for 3 to 4 days as well as had an episode of blood in the urine.  Over the last 2 weeks she has had severe urinary frequency during the day and night as well as some intermittent suprapubic pain and pressure.  She wonders if she could have a kidney stone.  Urinalysis with PCP has been benign aside from 1 with mild pyuria.  Urinalysis today is benign.  She had a KUB with PCP that showed multiple calcifications in the pelvis  She usually is a soda drinker but has cut back over the last few weeks.  She has a history of IBS and constipation, but constipation has improved over the last few days.  PVR today is normal at 35ml  PMH: Past Medical History:  Diagnosis Date   Anxiety    Depression    Heart murmur    Herpes    History of abnormal mammogram 2010   History of cellulitis    belly button   Hypercholesteremia    Hypertension    Nexplanon in place    placed 10/11/14, remove 10/11/17   OCD (obsessive compulsive disorder)     Surgical History: Past Surgical History:  Procedure Laterality Date   BIOPSY BREAST  04/2010   was normal   BREAST EXCISIONAL BIOPSY Left 2011   benign   BREAST SURGERY Left 2011   COLONOSCOPY  2010   COSMETIC SURGERY  2012   HERNIA REPAIR  2012   LIPOSUCTION  2012   MM DUCTOGRAM BILAT R/S  2010   due to bloody breast discharge    Family History: Family History  Problem Relation Age of Onset   Hypertension Mother    Hyperlipidemia Mother    Diabetes Mother    Stroke Mother    Heart disease Father        CABG   Hypertension Father    Breast cancer Sister    Breast cancer Maternal Aunt        twice 30s and 59s   Skin cancer Paternal Aunt        69s   Pancreatic cancer Paternal Uncle        68s   Bone  cancer Paternal Uncle        30s   Cancer Maternal Grandmother        lung   Stroke Paternal Grandmother    Stroke Paternal Grandfather    Mental illness Brother        anxiety    Social History:  reports that she has never smoked. She has never used smokeless tobacco. She reports that she does not drink alcohol and does not use drugs.  Physical Exam: BP 138/84   Pulse 89   Ht 5\' 3"  (1.6 m)   Wt 179 lb (81.2 kg)   BMI 31.71 kg/m    Constitutional:  Alert and oriented, No acute distress. Cardiovascular: No clubbing, cyanosis, or edema. Respiratory: Normal respiratory effort, no increased work of breathing. GI: Abdomen is soft, nontender, nondistended, no abdominal masses   Laboratory Data: Reviewed  Pertinent Imaging: I have personally viewed and interpreted the stat CT scan ordered today showing no evidence of ureteral stones or hydronephrosis, punctate nonobstructing left renal stone.  Assessment & Plan:  53 year old female with 3 weeks of urinary frequency of unclear etiology, CT today with no abnormal findings and no ureteral stones or hydronephrosis.  Urinalysis benign and PVR normal.  We discussed possible causes including interstitial cystitis, pelvic floor dysfunction, less likely bladder malignancy.  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

## 2024-01-07 ENCOUNTER — Other Ambulatory Visit: Admitting: Urology

## 2024-01-09 ENCOUNTER — Ambulatory Visit: Payer: Self-pay | Admitting: Nurse Practitioner

## 2024-01-15 ENCOUNTER — Encounter: Payer: Self-pay | Admitting: Urology

## 2024-01-15 ENCOUNTER — Ambulatory Visit (INDEPENDENT_AMBULATORY_CARE_PROVIDER_SITE_OTHER): Admitting: Urology

## 2024-01-15 VITALS — BP 138/89 | HR 74

## 2024-01-15 DIAGNOSIS — R350 Frequency of micturition: Secondary | ICD-10-CM

## 2024-01-15 MED ORDER — NITROFURANTOIN MONOHYD MACRO 100 MG PO CAPS
100.0000 mg | ORAL_CAPSULE | Freq: Once | ORAL | Status: AC
Start: 2024-01-15 — End: 2024-01-15
  Administered 2024-01-15: 100 mg via ORAL

## 2024-01-15 MED ORDER — LIDOCAINE HCL URETHRAL/MUCOSAL 2 % EX GEL
1.0000 | Freq: Once | CUTANEOUS | Status: AC
Start: 2024-01-15 — End: 2024-01-15
  Administered 2024-01-15: 1 via URETHRAL

## 2024-01-15 NOTE — Patient Instructions (Addendum)
Pelvic Floor Dysfunction, Female  Pelvic floor dysfunction (PFD) is a condition that results when the group of muscles and connective tissues that support the organs in the pelvis (pelvic floor muscles) do not work well. These muscles and their connections form a sling that supports the colon and bladder. In women, they also support the uterus. PFD causes pelvic floor muscles to be too weak, too tight, or both. In PFD, muscle movements are not coordinated. This may cause bowel or bladder problems. It may also cause pain. What are the causes? This condition may be caused by an injury to the pelvic area or by a weakening of pelvic muscles. This often results from pregnancy and childbirth or other types of strain. In many cases, the exact cause is not known. What increases the risk? The following factors may make you more likely to develop this condition: Having chronic bladder tissue inflammation (interstitial cystitis). Being an older person. Being overweight. History of radiation treatment for cancer in the pelvic region. Previous pelvic surgery, such as removal of the uterus (hysterectomy). What are the signs or symptoms? Symptoms of this condition vary and may include: Bladder symptoms, such as: Trouble starting urination and emptying the bladder. Frequent urinary tract infections. Leaking urine when coughing, laughing, or exercising (stress incontinence). Having to pass urine urgently or frequently. Pain when passing urine. Bowel symptoms, such as: Constipation. Urgent or frequent bowel movements. Incomplete bowel movements. Painful bowel movements. Leaking stool or gas. Unexplained genital or rectal pain. Genital or rectal muscle spasms. Low back pain. Other symptoms may include: A heavy, full, or aching feeling in the vagina. A bulge that protrudes into the vagina. Pain during or after sex. How is this diagnosed? This condition may be diagnosed based on: Your symptoms and  medical history. A physical exam. During the exam, your health care provider may check your pelvic muscles for tightness, spasm, pain, or weakness. This may include a rectal exam and a pelvic exam. In some cases, you may have diagnostic tests, such as: Electrical muscle function tests. Urine flow testing. X-ray tests of bowel function. Ultrasound of the pelvic organs. How is this treated? Treatment for this condition depends on the symptoms. Treatment options include: Physical therapy. This may include Kegel exercises to help relax or strengthen the pelvic floor muscles. Biofeedback. This type of therapy provides feedback on how tight your pelvic floor muscles are so that you can learn to control them. Internal or external massage therapy. A treatment that involves electrical stimulation of the pelvic floor muscles to help control pain (transcutaneous electrical nerve stimulation, or TENS). Sound wave therapy (ultrasound) to reduce muscle spasms. Medicines, such as: Muscle relaxants. Bladder control medicines. Surgery to reconstruct or support pelvic floor muscles may be an option if other treatments do not help. Follow these instructions at home: Activity Do your usual activities as told by your health care provider. Ask your health care provider if you should modify any activities. Do pelvic floor strengthening or relaxing exercises at home as told by your physical therapist. Lifestyle Maintain a healthy weight. Eat foods that are high in fiber, such as beans, whole grains, and fresh fruits and vegetables. Limit foods that are high in fat and processed sugars, such as fried or sweet foods. Manage stress with relaxation techniques such as yoga or meditation. General instructions If you have problems with leakage: Use absorbable pads or wear padded underwear. Wash frequently with mild soap. Keep your genital and anal area as clean and dry as  possible. Ask your health care provider if  you should try a barrier cream to prevent skin irritation. Take warm baths to relieve pelvic muscle tension or spasms. Take over-the-counter and prescription medicines only as told by your health care provider. Keep all follow-up visits. How is this prevented? The cause of PFD is not always known, but there are a few things you can do to reduce the risk of developing this condition, including: Staying at a healthy weight. Getting regular exercise. Managing stress. Contact a health care provider if: Your symptoms are not improving with home care. You have signs or symptoms of PFD that get worse at home. You develop new signs or symptoms. You have signs of a urinary tract infection, such as: Fever. Chills. Increased urinary frequency. A burning feeling when urinating. You have not had a bowel movement in 3 days (constipation). Summary Pelvic floor dysfunction results when the muscles and connective tissues in your pelvic floor do not work well. These muscles and their connections form a sling that supports your colon and bladder. In women, they also support the uterus. PFD may be caused by an injury to the pelvic area or by a weakening of pelvic muscles. PFD causes pelvic floor muscles to be too weak, too tight, or a combination of both. Symptoms may vary from person to person. In most cases, PFD can be treated with physical therapies and medicines. Surgery may be an option if other treatments do not help. This information is not intended to replace advice given to you by your health care provider. Make sure you discuss any questions you have with your health care provider. Document Revised: 02/08/2021 Document Reviewed: 02/08/2021 Elsevier Patient Education  2024 [employer].

## 2024-01-15 NOTE — Progress Notes (Signed)
 Cystoscopy Procedure Note:  Indication: Urinary frequency, pelvic pressure  Nitrofurantoin given for prophylaxis  After informed consent and discussion of the procedure and its risks, Victoria Shepard was positioned and prepped in the standard fashion. Cystoscopy was performed with a flexible cystoscope. The urethra, bladder neck and entire bladder was visualized in a standard fashion. The ureteral orifices were visualized in their normal location and orientation.  Bladder mucosa grossly normal throughout, no abnormalities on retroflexion  Imaging: CT stone protocol no abnormalities  Findings: Normal cystoscopy  Assessment and Plan: Her symptoms have overall improved, nocturia resolved, we discussed avoiding bladder irritants, pelvic floor stretching exercises.  RTC 6 weeks symptom check  Legrand Rams, MD 01/15/2024

## 2024-01-20 ENCOUNTER — Ambulatory Visit: Admitting: Nurse Practitioner

## 2024-01-27 NOTE — Addendum Note (Signed)
 Encounter addended by: Tacy Expose, RT on: 01/27/2024 4:55 PM  Actions taken: Imaging Exam begun, Image imported

## 2024-02-03 ENCOUNTER — Ambulatory Visit: Admitting: Nurse Practitioner

## 2024-02-05 ENCOUNTER — Other Ambulatory Visit: Payer: Self-pay | Admitting: Nurse Practitioner

## 2024-02-05 NOTE — Telephone Encounter (Signed)
 Labs in date.  Requested Prescriptions  Pending Prescriptions Disp Refills   rosuvastatin  (CRESTOR ) 20 MG tablet [Pharmacy Med Name: ROSUVASTATIN  CALCIUM  20 MG TAB] 90 tablet 0    Sig: TAKE 1 TABLET BY MOUTH EVERY DAY     Cardiovascular:  Antilipid - Statins 2 Failed - 02/05/2024  2:58 PM      Failed - Lipid Panel in normal range within the last 12 months    Cholesterol, Total  Date Value Ref Range Status  02/25/2023 125 100 - 199 mg/dL Final   LDL Chol Calc (NIH)  Date Value Ref Range Status  02/25/2023 67 0 - 99 mg/dL Final   HDL  Date Value Ref Range Status  02/25/2023 39 (L) >39 mg/dL Final   Triglycerides  Date Value Ref Range Status  02/25/2023 101 0 - 149 mg/dL Final         Passed - Cr in normal range and within 360 days    Creatinine  Date Value Ref Range Status  06/18/2014 1.16 0.60 - 1.30 mg/dL Final   Creatinine, Ser  Date Value Ref Range Status  12/18/2023 0.91 0.57 - 1.00 mg/dL Final         Passed - Patient is not pregnant      Passed - Valid encounter within last 12 months    Recent Outpatient Visits           1 month ago Chronic constipation   [place] [facility] Aileen Alexanders, NP   1 month ago Acute left-sided low back pain without sciatica   [facility] Aileen Alexanders, NP       Future Appointments             In 2 weeks McGowan, Danne Dustman, PA-C [facility] [place]             lisinopril  (ZESTRIL ) 20 MG tablet [Pharmacy Med Name: LISINOPRIL  20 MG TABLET] 90 tablet 0    Sig: TAKE 1 TABLET BY MOUTH EVERY DAY     Cardiovascular:  ACE Inhibitors Passed - 02/05/2024  2:58 PM      Passed - Cr in normal range and within 180 days    Creatinine  Date Value Ref Range Status  06/18/2014 1.16 0.60 - 1.30 mg/dL Final   Creatinine, Ser  Date Value Ref Range Status  12/18/2023 0.91 0.57 - 1.00 mg/dL Final         Passed - K in normal range and within 180 days    Potassium  Date Value  Ref Range Status  12/18/2023 4.0 3.5 - 5.2 mmol/L Final  06/18/2014 3.9 3.5 - 5.1 mmol/L Final         Passed - Patient is not pregnant      Passed - Last BP in normal range    BP Readings from Last 1 Encounters:  01/15/24 138/89         Passed - Valid encounter within last 6 months    Recent Outpatient Visits           1 month ago Chronic constipation   [place] [facility] Aileen Alexanders, NP   1 month ago Acute left-sided low back pain without sciatica   [place] [facility] Aileen Alexanders, NP       Future Appointments             In 2 weeks McGowan, Nyra Bellis [facility] [place]

## 2024-02-19 ENCOUNTER — Other Ambulatory Visit: Payer: Self-pay | Admitting: Nurse Practitioner

## 2024-02-21 NOTE — Telephone Encounter (Signed)
 Requested medication (s) are due for refill today: yes  Requested medication (s) are on the active medication list: yes  Last refill:  10/07/23 6 ml 1 RF  Future visit scheduled: yes  Notes to clinic:  overdue lab work   Requested Prescriptions  Pending Prescriptions Disp Refills   WEGOVY  2.4 MG/0.75ML SOAJ [Pharmacy Med Name: WEGOVY  2.4 MG/0.75 ML PEN]  1    Sig: Inject 2.4 mg into the skin once a week.     Endocrinology:  Diabetes - GLP-1 Receptor Agonists - semaglutide  Failed - 02/21/2024  8:54 AM      Failed - HBA1C in normal range and within 180 days    Hgb A1c MFr Bld  Date Value Ref Range Status  02/25/2023 6.0 (H) 4.8 - 5.6 % Final    Comment:             Prediabetes: 5.7 - 6.4          Diabetes: >6.4          Glycemic control for adults with diabetes: <7.0          Passed - Cr in normal range and within 360 days    Creatinine  Date Value Ref Range Status  06/18/2014 1.16 0.60 - 1.30 mg/dL Final   Creatinine, Ser  Date Value Ref Range Status  12/18/2023 0.91 0.57 - 1.00 mg/dL Final         Passed - Valid encounter within last 6 months    Recent Outpatient Visits           1 month ago Chronic constipation   [place] [facility] Aileen Alexanders, NP   2 months ago Acute left-sided low back pain without sciatica    [facility] Aileen Alexanders, NP

## 2024-02-24 ENCOUNTER — Ambulatory Visit: Admitting: Urology

## 2024-03-11 ENCOUNTER — Encounter: Payer: Self-pay | Admitting: Nurse Practitioner

## 2024-03-16 ENCOUNTER — Ambulatory Visit: Admitting: Nurse Practitioner

## 2024-03-20 ENCOUNTER — Telehealth: Payer: Self-pay

## 2024-03-20 ENCOUNTER — Other Ambulatory Visit (HOSPITAL_COMMUNITY): Payer: Self-pay

## 2024-03-20 NOTE — Telephone Encounter (Signed)
 Pharmacy Patient Advocate Encounter   Received notification from Pt Calls Messages that prior authorization for Wegovy  2.4MG /0.75ML auto-injectors is required/requested.   Insurance verification completed.   The patient is insured through [facility] .   Per test claim: PA required and submitted KEY/EOC/Request #: [id number] due to

## 2024-03-20 NOTE — Telephone Encounter (Signed)
 Attempted PA, received following message in [facility]

## 2024-03-23 NOTE — Telephone Encounter (Signed)
 Please let patient know that the Wegovy  is no longer covered under her insurance.

## 2024-03-24 ENCOUNTER — Other Ambulatory Visit: Payer: Self-pay

## 2024-03-24 MED ORDER — LINACLOTIDE 290 MCG PO CAPS: 290.0000 ug | ORAL_CAPSULE | Freq: Every day | ORAL | 0 refills | Status: DC

## 2024-03-24 NOTE — Telephone Encounter (Signed)
 Called and notified patient of providers message regarding medication. Patient verbalized understanding.

## 2024-04-10 ENCOUNTER — Ambulatory Visit: Admitting: Nurse Practitioner

## 2024-04-20 ENCOUNTER — Encounter: Payer: Self-pay | Admitting: Nurse Practitioner

## 2024-04-21 NOTE — Telephone Encounter (Signed)
 Called patient to schedule. She will call back when she gets to her appointment book.

## 2024-04-23 ENCOUNTER — Encounter: Payer: Self-pay | Admitting: Nurse Practitioner

## 2024-04-23 ENCOUNTER — Ambulatory Visit: Admitting: Nurse Practitioner

## 2024-04-23 VITALS — BP 117/81 | HR 91 | Ht 63.0 in | Wt 187.8 lb

## 2024-04-23 DIAGNOSIS — G43811 Other migraine, intractable, with status migrainosus: Secondary | ICD-10-CM | POA: Diagnosis not present

## 2024-04-23 MED ORDER — SUMATRIPTAN SUCCINATE 25 MG PO TABS
25.0000 mg | ORAL_TABLET | ORAL | 0 refills | Status: DC | PRN
Start: 2024-04-23 — End: 2024-07-06

## 2024-04-23 NOTE — Progress Notes (Signed)
 BP 117/81   Pulse 91   Ht 5' 3 (1.6 m)   Wt 187 lb 12.8 oz (85.2 kg)   SpO2 97%   BMI 33.27 kg/m    Subjective:    Patient ID: Victoria Shepard, female    DOB: 07-02-1971, 53 y.o.   MRN: [id number]  HPI: TIFFANYE HARTMANN is a 53 y.o. female  Chief Complaint  Patient presents with   Headache    Onset about 2 weeks ago. Occurring everyday.  Has been hypotensive at home. Has stopped bp meds   Obesity   Patient states she has had a headache for about 2 weeks.  Makes her feel like she is going to throw up.  States the headache will go away for a little bit but then it comes back.  States the ibuprofen  will knock it out for a little bit but then it comes back.  Her blood pressure at home has been low.  She hasn't taken her blood pressure medication for about 2 weeks.  Running 90/50s.  She is using zofran - using at least 1 per day.  The last time she took the Wegovy  about 2 weeks ago.  States it isn't really helping her.  She has been walking more. She has been increasing her water intake- probably drinking about 48 ounces of water per day.      Relevant past medical, surgical, family and social history reviewed and updated as indicated. Interim medical history since our last visit reviewed. Allergies and medications reviewed and updated.  Review of Systems  Gastrointestinal:  Positive for nausea.  Neurological:  Positive for headaches.    Per HPI unless specifically indicated above     Objective:    BP 117/81   Pulse 91   Ht 5' 3 (1.6 m)   Wt 187 lb 12.8 oz (85.2 kg)   SpO2 97%   BMI 33.27 kg/m   Wt Readings from Last 3 Encounters:  04/23/24 187 lb 12.8 oz (85.2 kg)  12/31/23 179 lb (81.2 kg)  12/18/23 180 lb 6.4 oz (81.8 kg)    Physical Exam Vitals and nursing note reviewed.  Constitutional:      General: She is not in acute distress.    Appearance: Normal appearance. She is normal weight. She is not ill-appearing, toxic-appearing or diaphoretic.  HENT:     Head:  Normocephalic.     Right Ear: External ear normal.     Left Ear: External ear normal.     Nose: Nose normal.     Mouth/Throat:     Mouth: Mucous membranes are moist.     Pharynx: Oropharynx is clear.  Eyes:     General:        Right eye: No discharge.        Left eye: No discharge.     Extraocular Movements: Extraocular movements intact.     Conjunctiva/sclera: Conjunctivae normal.     Pupils: Pupils are equal, round, and reactive to light.  Cardiovascular:     Rate and Rhythm: Normal rate and regular rhythm.     Heart sounds: No murmur heard. Pulmonary:     Effort: Pulmonary effort is normal. No respiratory distress.     Breath sounds: Normal breath sounds. No wheezing or rales.  Musculoskeletal:     Cervical back: Normal range of motion and neck supple.  Skin:    General: Skin is warm and dry.     Capillary Refill: Capillary refill takes less than 2  seconds.  Neurological:     General: No focal deficit present.     Mental Status: [name] and oriented to person, place, and time. Mental status is at baseline.     Cranial Nerves: Cranial nerves 2-12 are intact.     Sensory: Sensation is intact.     Motor: Motor function is intact.     Coordination: Coordination is intact.  Psychiatric:        Mood and Affect: Mood normal.        Behavior: Behavior normal.        Thought Content: Thought content normal.        Judgment: Judgment normal.     Results for orders placed or performed during the hospital encounter of 12/31/23  Urinalysis, Complete w Microscopic -   Collection Time: 12/31/23 10:43 AM  Result Value Ref Range   Color, Urine YELLOW YELLOW   APPearance CLEAR CLEAR   Specific Gravity, Urine 1.015 1.005 - 1.030   pH 5.5 5.0 - 8.0   Glucose, UA NEGATIVE NEGATIVE mg/dL   Hgb urine dipstick NEGATIVE NEGATIVE   Bilirubin Urine NEGATIVE NEGATIVE   Ketones, ur NEGATIVE NEGATIVE mg/dL   Protein, ur NEGATIVE NEGATIVE mg/dL   Nitrite NEGATIVE NEGATIVE    Leukocytes,Ua NEGATIVE NEGATIVE   Squamous Epithelial / HPF 6-10 0 - 5 /HPF   WBC, UA 0-5 0 - 5 WBC/hpf   RBC / HPF 0-5 0 - 5 RBC/hpf   Bacteria, UA FEW (A) NONE SEEN   Mucus PRESENT       Assessment & Plan:   Problem List Items Addressed This Visit   None Visit Diagnoses       Other migraine with status migrainosus, intractable    -  Primary   Will treat with sumatitriptan. Discusse side effects and benefits of medication. Discussed how to take medication.  Follow up if not improved. Okay to us  zofran    Relevant Medications   SUMAtriptan  (IMITREX ) 25 MG tablet        Follow up plan: Return in about 2 weeks (around 05/07/2024) for BP Check.

## 2024-04-26 ENCOUNTER — Encounter: Payer: Self-pay | Admitting: Nurse Practitioner

## 2024-05-04 ENCOUNTER — Ambulatory Visit: Admitting: Family

## 2024-05-06 ENCOUNTER — Encounter: Payer: Self-pay | Admitting: Nurse Practitioner

## 2024-05-07 ENCOUNTER — Ambulatory Visit: Admitting: Nurse Practitioner

## 2024-05-07 NOTE — Telephone Encounter (Signed)
 Called patient and left a message for her to call back to get scheduled for a virtual appt in the next available opening.

## 2024-05-11 NOTE — Telephone Encounter (Signed)
 Appt scheduled

## 2024-05-12 ENCOUNTER — Encounter: Payer: Self-pay | Admitting: Nurse Practitioner

## 2024-05-13 ENCOUNTER — Ambulatory Visit: Admitting: Nurse Practitioner

## 2024-05-15 ENCOUNTER — Ambulatory Visit: Admitting: Nurse Practitioner

## 2024-05-17 ENCOUNTER — Encounter: Payer: Self-pay | Admitting: Nurse Practitioner

## 2024-06-03 ENCOUNTER — Encounter: Payer: Self-pay | Admitting: Nurse Practitioner

## 2024-06-03 ENCOUNTER — Telehealth (INDEPENDENT_AMBULATORY_CARE_PROVIDER_SITE_OTHER): Admitting: Nurse Practitioner

## 2024-06-03 VITALS — Ht 63.0 in | Wt 187.0 lb

## 2024-06-03 DIAGNOSIS — Z6833 Body mass index (BMI) 33.0-33.9, adult: Secondary | ICD-10-CM

## 2024-06-03 DIAGNOSIS — F419 Anxiety disorder, unspecified: Secondary | ICD-10-CM

## 2024-06-03 MED ORDER — BUPROPION HCL ER (XL) 150 MG PO TB24: 150.0000 mg | ORAL_TABLET | Freq: Every day | ORAL | 0 refills | Status: DC

## 2024-06-03 MED ORDER — ZEPBOUND 2.5 MG/0.5ML ~~LOC~~ SOAJ: 2.5000 mg | SUBCUTANEOUS | 0 refills | Status: DC

## 2024-06-03 NOTE — Assessment & Plan Note (Signed)
 Chronic. Not well controlled.  Will start Wellbutrin  150mg  daily.  Side effects and benefits of medication discussed during visit.  Follow up in 1 month.  Call sooner if concerns arise.

## 2024-06-03 NOTE — Progress Notes (Signed)
 Ht 5' 3 (1.6 m)   Wt 187 lb (84.8 kg)   BMI 33.13 kg/m    Subjective:    Patient ID: Victoria Shepard, female    DOB: [birth year], 53 y.o.   MRN: [id number]  HPI: Victoria Shepard is a 53 y.o. female  Chief Complaint  Patient presents with   Weight Management Screening    Zepbound    WEIGHT MANAGEMENT Patient seen today to discuss switching from Wegovy  to Zepbound .  Feels like she isn't seeing any improvement in her appetite.    Patient states she is having some anxiety.  She recently bought a new house.  She has had a lot going on personally.  She is really nervous feeling all the time.  She is more moody.  Feeling tired all the time.  Feels like she is having panic attacks.       Relevant past medical, surgical, family and social history reviewed and updated as indicated. Interim medical history since our last visit reviewed. Allergies and medications reviewed and updated.  Review of Systems  Constitutional:  Negative for unexpected weight change.  Psychiatric/Behavioral:  The patient is nervous/anxious.     Per HPI unless specifically indicated above     Objective:    Ht 5' 3 (1.6 m)   Wt 187 lb (84.8 kg)   BMI 33.13 kg/m   Wt Readings from Last 3 Encounters:  06/03/24 187 lb (84.8 kg)  04/23/24 187 lb 12.8 oz (85.2 kg)  12/31/23 179 lb (81.2 kg)    Physical Exam Vitals and nursing note reviewed.  HENT:     Head: Normocephalic.     Right Ear: Hearing normal.     Left Ear: Hearing normal.     Nose: Nose normal.  Eyes:     Pupils: Pupils are equal, round, and reactive to light.  Pulmonary:     Effort: Pulmonary effort is normal. No respiratory distress.  Neurological:     Mental Status: [name].  Psychiatric:        Mood and Affect: Mood normal.        Behavior: Behavior normal.        Thought Content: Thought content normal.        Judgment: Judgment normal.     Results for orders placed or performed during the hospital encounter of 12/31/23   Urinalysis, Complete w Microscopic -   Collection Time: 12/31/23 10:43 AM  Result Value Ref Range   Color, Urine YELLOW YELLOW   APPearance CLEAR CLEAR   Specific Gravity, Urine 1.015 1.005 - 1.030   pH 5.5 5.0 - 8.0   Glucose, UA NEGATIVE NEGATIVE mg/dL   Hgb urine dipstick NEGATIVE NEGATIVE   Bilirubin Urine NEGATIVE NEGATIVE   Ketones, ur NEGATIVE NEGATIVE mg/dL   Protein, ur NEGATIVE NEGATIVE mg/dL   Nitrite NEGATIVE NEGATIVE   Leukocytes,Ua NEGATIVE NEGATIVE   Squamous Epithelial / HPF 6-10 0 - 5 /HPF   WBC, UA 0-5 0 - 5 WBC/hpf   RBC / HPF 0-5 0 - 5 RBC/hpf   Bacteria, UA FEW (A) NONE SEEN   Mucus PRESENT       Assessment & Plan:   Problem List Items Addressed This Visit       Other   Anxiety - Primary   Chronic. Not well controlled.  Will start Wellbutrin  150mg  daily.  Side effects and benefits of medication discussed during visit.  Follow up in 1 month.  Call sooner if concerns arise.  Relevant Medications   buPROPion  (WELLBUTRIN  XL) 150 MG 24 hr tablet   Morbid obesity (HCC)   Chronic.  Not well controlled.  Would like to change from Wegovy  to Zepbound .  Side effects and benefits discussed.  Will start Zepbound  2.5mg  weekly.  Follow up in 1 month.  Call sooner if concerns arise.       Relevant Medications   tirzepatide  (ZEPBOUND ) 2.5 MG/0.5ML Pen     Follow up plan: Return in about 1 month (around 07/04/2024) for Weight Managment, Depression/Anxiety FU (virtual).  This visit was completed via MyChart due to the restrictions of the COVID-19 pandemic. All issues as above were discussed and addressed. Physical exam was done as above through visual confirmation on MyChart. If it was felt that the patient should be evaluated in the office, they were directed there. The patient verbally consented to this visit. Location of the patient: Home Location of the provider: Office Those involved with this call:  Provider: Darice Petty, NP CMA: Izetta Sarah,  CMA Front Desk/Registration: Claretta Maiden This encounter was conducted via video.  I spent 30 minutes dedicated to the care of this patient on the date of this encounter to include previsit review of plan of care, medications, follow up, face to face time with the patient, and post visit ordering of testing.

## 2024-06-03 NOTE — Progress Notes (Signed)
 Called patient and left a message for her to call back to get scheduled for.SABRASABRAReturn in about 1 month (around 07/04/2024) for Weight Managment, Depression/Anxiety FU (virtual).

## 2024-06-03 NOTE — Assessment & Plan Note (Addendum)
 Chronic.  Not well controlled.  Would like to change from Wegovy  to Zepbound .  Side effects and benefits discussed.  Will start Zepbound  2.5mg  weekly.  Follow up in 1 month.  Call sooner if concerns arise.

## 2024-06-25 ENCOUNTER — Other Ambulatory Visit: Payer: Self-pay | Admitting: Nurse Practitioner

## 2024-06-26 NOTE — Telephone Encounter (Signed)
 Requested medications are due for refill today.  yes  Requested medications are on the active medications list.  yes  Last refill. 06/03/2024 #30 0 rf  Future visit scheduled.   yes  Notes to clinic.  New medication to this pt.    Requested Prescriptions  Pending Prescriptions Disp Refills   buPROPion  (WELLBUTRIN  XL) 150 MG 24 hr tablet [Pharmacy Med Name: BUPROPION  HCL XL 150 MG TABLET] 90 tablet 1    Sig: TAKE 1 TABLET BY MOUTH EVERY DAY     Psychiatry: Antidepressants - bupropion  Failed - 06/26/2024 11:22 AM      Failed - Valid encounter within last 6 months    Recent Outpatient Visits           3 weeks ago Anxiety   [place] [facility] Melvin Pao, NP   2 months ago Other migraine with status migrainosus, intractable   [place] [facility] Melvin Pao, NP   6 months ago Chronic constipation   [place] [facility] Melvin Pao, NP   6 months ago Acute left-sided low back pain without sciatica    [facility] Melvin Pao, NP              Passed - Cr in normal range and within 360 days    Creatinine  Date Value Ref Range Status  06/18/2014 1.16 0.60 - 1.30 mg/dL Final   Creatinine, Ser  Date Value Ref Range Status  12/18/2023 0.91 0.57 - 1.00 mg/dL Final         Passed - AST in normal range and within 360 days    AST  Date Value Ref Range Status  12/18/2023 18 0 - 40 IU/L Final   SGOT(AST)  Date Value Ref Range Status  03/21/2013 29 15 - 37 Unit/L Final   AST (SGOT) Piccolo, Waived  Date Value Ref Range Status  04/29/2015 36 11 - 38 U/L Final         Passed - ALT in normal range and within 360 days    ALT  Date Value Ref Range Status  12/18/2023 16 0 - 32 IU/L Final   SGPT (ALT)  Date Value Ref Range Status  03/21/2013 55 12 - 78 U/L Final   ALT (SGPT) Piccolo, Waived  Date Value Ref Range Status  04/29/2015 37 10 - 47 U/L Final         Passed -  Completed PHQ-2 or PHQ-9 in the last 360 days      Passed - Last BP in normal range    BP Readings from Last 1 Encounters:  04/23/24 117/81

## 2024-06-29 ENCOUNTER — Encounter: Payer: Self-pay | Admitting: Nurse Practitioner

## 2024-06-29 DIAGNOSIS — M549 Dorsalgia, unspecified: Secondary | ICD-10-CM

## 2024-06-30 ENCOUNTER — Ambulatory Visit: Admitting: Nurse Practitioner

## 2024-07-06 ENCOUNTER — Encounter: Payer: Self-pay | Admitting: Nurse Practitioner

## 2024-07-06 ENCOUNTER — Other Ambulatory Visit (HOSPITAL_COMMUNITY): Payer: Self-pay

## 2024-07-06 ENCOUNTER — Telehealth (INDEPENDENT_AMBULATORY_CARE_PROVIDER_SITE_OTHER): Admitting: Nurse Practitioner

## 2024-07-06 ENCOUNTER — Telehealth: Payer: Self-pay

## 2024-07-06 VITALS — BP 125/80 | HR 77 | Wt 189.0 lb

## 2024-07-06 DIAGNOSIS — Z6833 Body mass index (BMI) 33.0-33.9, adult: Secondary | ICD-10-CM

## 2024-07-06 DIAGNOSIS — L309 Dermatitis, unspecified: Secondary | ICD-10-CM

## 2024-07-06 MED ORDER — LINACLOTIDE 290 MCG PO CAPS: 290.0000 ug | ORAL_CAPSULE | Freq: Every day | ORAL | 0 refills | Status: DC

## 2024-07-06 MED ORDER — IBUPROFEN 600 MG PO TABS: 600.0000 mg | ORAL_TABLET | Freq: Three times a day (TID) | ORAL | 0 refills | Status: DC | PRN

## 2024-07-06 MED ORDER — TRIAMCINOLONE ACETONIDE 0.1 % EX CREA: 1.0000 | TOPICAL_CREAM | Freq: Two times a day (BID) | CUTANEOUS | 0 refills | Status: AC

## 2024-07-06 MED ORDER — LISINOPRIL 20 MG PO TABS: 20.0000 mg | ORAL_TABLET | Freq: Every day | ORAL | 0 refills | Status: AC

## 2024-07-06 MED ORDER — ZEPBOUND 2.5 MG/0.5ML ~~LOC~~ SOAJ: 2.5000 mg | SUBCUTANEOUS | 0 refills | Status: DC

## 2024-07-06 MED ORDER — ONDANSETRON 8 MG PO TBDP: 8.0000 mg | ORAL_TABLET | Freq: Three times a day (TID) | ORAL | 1 refills | Status: DC | PRN

## 2024-07-06 MED ORDER — ROSUVASTATIN CALCIUM 20 MG PO TABS: 20.0000 mg | ORAL_TABLET | Freq: Every day | ORAL | 0 refills | Status: DC

## 2024-07-06 NOTE — Telephone Encounter (Signed)
 Pharmacy Patient Advocate Encounter   Received notification from Onbase that prior authorization for Zepbound  2.5 mg/0.5 ml pen is required/requested.   Insurance verification completed.   The patient is insured through [employer] .   Per test claim:  Qsymia, Orlistat, Phentermine  or Diethylpropion is preferred by the insurance.  If suggested medication is appropriate, Please send in a new RX and discontinue this one. If not, please advise as to why it's not appropriate so that we may request a Prior Authorization. Please note, some preferred medications may still require a PA.  If the suggested medications have not been trialed and there are no contraindications to their use, the PA will not be submitted, as it will not be approved.  *Product/service not covered, please see list above of what could be covered

## 2024-07-06 NOTE — Progress Notes (Signed)
 BP 125/80 Comment: patient obtained  Pulse 77   Wt 189 lb (85.7 kg) Comment: per pt  BMI 33.48 kg/m    Subjective:    Patient ID: Victoria Shepard, female    DOB: 1971-04-30, 53 y.o.   MRN: [id number]  HPI: Victoria Shepard is a 53 y.o. female  Chief Complaint  Patient presents with   Anxiety    Pt prefers not to take any medication states she will rather just deal with it    Weight Loss   Rash    Started a couple of months ago , gets better then comes back    WEIGHT MANAGEMENT Patient seen today to discuss weight loss.  She did restart Wegovy  after being off the medication for awhile.  She is having a lot of nausea.  Wasn't able to get Zepbound  due to cost.  However, she has figured out a way to get the medication and would like to start Zepbound .     Patient states she is having itchy patches on her knees.  Having redness.      Relevant past medical, surgical, family and social history reviewed and updated as indicated. Interim medical history since our last visit reviewed. Allergies and medications reviewed and updated.  Review of Systems  Constitutional:  Negative for unexpected weight change.  Skin:  Positive for rash.  Psychiatric/Behavioral:  The patient is nervous/anxious.     Per HPI unless specifically indicated above     Objective:    BP 125/80 Comment: patient obtained  Pulse 77   Wt 189 lb (85.7 kg) Comment: per pt  BMI 33.48 kg/m   Wt Readings from Last 3 Encounters:  07/06/24 189 lb (85.7 kg)  06/03/24 187 lb (84.8 kg)  04/23/24 187 lb 12.8 oz (85.2 kg)    Physical Exam Vitals and nursing note reviewed.  HENT:     Head: Normocephalic.     Right Ear: Hearing normal.     Left Ear: Hearing normal.     Nose: Nose normal.  Eyes:     Pupils: Pupils are equal, round, and reactive to light.  Pulmonary:     Effort: Pulmonary effort is normal. No respiratory distress.  Skin:    Comments: Redness, raised rash on knees  Neurological:     Mental  Status: [name].  Psychiatric:        Mood and Affect: Mood normal.        Behavior: Behavior normal.        Thought Content: Thought content normal.        Judgment: Judgment normal.     Results for orders placed or performed during the hospital encounter of 12/31/23  Urinalysis, Complete w Microscopic -   Collection Time: 12/31/23 10:43 AM  Result Value Ref Range   Color, Urine YELLOW YELLOW   APPearance CLEAR CLEAR   Specific Gravity, Urine 1.015 1.005 - 1.030   pH 5.5 5.0 - 8.0   Glucose, UA NEGATIVE NEGATIVE mg/dL   Hgb urine dipstick NEGATIVE NEGATIVE   Bilirubin Urine NEGATIVE NEGATIVE   Ketones, ur NEGATIVE NEGATIVE mg/dL   Protein, ur NEGATIVE NEGATIVE mg/dL   Nitrite NEGATIVE NEGATIVE   Leukocytes,Ua NEGATIVE NEGATIVE   Squamous Epithelial / HPF 6-10 0 - 5 /HPF   WBC, UA 0-5 0 - 5 WBC/hpf   RBC / HPF 0-5 0 - 5 RBC/hpf   Bacteria, UA FEW (A) NONE SEEN   Mucus PRESENT  Assessment & Plan:   Problem List Items Addressed This Visit       Other   Morbid obesity (HCC)   Chronic. Ongoing.  Restarted Wegovy  yesterday.  Will start Zepbound  2.5mg  weekly.  After first 4 weeks, will increase dose to 5mg  weekly.  Follow up in 3 months.  Call sooner if concerns arise.      Relevant Medications   tirzepatide  (ZEPBOUND ) 2.5 MG/0.5ML Pen   Other Visit Diagnoses       Eczema, unspecified type    -  Primary   Will treat with Triamcinalone cream. Follow up if not improved.         Follow up plan: Return in about 3 months (around 10/05/2024) for HTN, HLD, DM2 FU in person.  This visit was completed via MyChart due to the restrictions of the COVID-19 pandemic. All issues as above were discussed and addressed. Physical exam was done as above through visual confirmation on MyChart. If it was felt that the patient should be evaluated in the office, they were directed there. The patient verbally consented to this visit. Location of the patient: Home Location of the  provider: Office Those involved with this call:  Provider: Darice Petty, NP CMA: Laymon Metro, CMA Front Desk/Registration: Claretta Maiden This encounter was conducted via video.  I spent 30 minutes dedicated to the care of this patient on the date of this encounter to include previsit review of plan of care, medications, follow up, face to face time with the patient, and post visit ordering of testing.

## 2024-07-06 NOTE — Assessment & Plan Note (Signed)
 Chronic. Ongoing.  Restarted Wegovy  yesterday.  Will start Zepbound  2.5mg  weekly.  After first 4 weeks, will increase dose to 5mg  weekly.  Follow up in 3 months.  Call sooner if concerns arise.

## 2024-07-06 NOTE — Progress Notes (Signed)
 Scheduled

## 2024-07-07 NOTE — Telephone Encounter (Signed)
 Patient is buying medication our of pocket.

## 2024-07-08 ENCOUNTER — Encounter: Payer: Self-pay | Admitting: Nurse Practitioner

## 2024-07-08 ENCOUNTER — Other Ambulatory Visit (HOSPITAL_COMMUNITY): Payer: Self-pay

## 2024-07-08 ENCOUNTER — Telehealth: Payer: Self-pay | Admitting: Pharmacy Technician

## 2024-07-08 MED ORDER — TIRZEPATIDE-WEIGHT MANAGEMENT 2.5 MG/0.5ML ~~LOC~~ SOLN: 2.5000 mg | SUBCUTANEOUS | 0 refills | Status: DC

## 2024-07-08 NOTE — Telephone Encounter (Signed)
 Pharmacy Patient Advocate Encounter   Received notification from Onbase that prior authorization for Linzess  capsules is required/requested.   Insurance verification completed.   The patient is insured through [facility] .   Per test claim: PA required; PA submitted to above mentioned insurance via Latent Key/confirmation #/EOC [id number] Status is pending

## 2024-07-09 NOTE — Telephone Encounter (Signed)
 Pharmacy Patient Advocate Encounter  Received notification from [employer] that Prior Authorization for Linzess  capsules has been APPROVED from 07/09/2024 to 07/08/2025.   PA #/Case ID/Reference #: [id number]

## 2024-07-21 ENCOUNTER — Encounter: Payer: Self-pay | Admitting: Nurse Practitioner

## 2024-07-23 MED ORDER — TIRZEPATIDE-WEIGHT MANAGEMENT 5 MG/0.5ML ~~LOC~~ SOLN
5.0000 mg | SUBCUTANEOUS | 2 refills | Status: DC
Start: 2024-07-23 — End: 2024-08-17

## 2024-08-06 ENCOUNTER — Encounter: Payer: Self-pay | Admitting: Nurse Practitioner

## 2024-08-15 ENCOUNTER — Encounter: Payer: Self-pay | Admitting: Nurse Practitioner

## 2024-08-17 MED ORDER — TIRZEPATIDE-WEIGHT MANAGEMENT 7.5 MG/0.5ML ~~LOC~~ SOLN: 7.5000 mg | SUBCUTANEOUS | 2 refills | Status: DC

## 2024-09-08 ENCOUNTER — Other Ambulatory Visit: Payer: Self-pay

## 2024-09-08 ENCOUNTER — Ambulatory Visit: Payer: Self-pay

## 2024-09-08 ENCOUNTER — Encounter: Payer: Self-pay | Admitting: *Deleted

## 2024-09-08 ENCOUNTER — Emergency Department

## 2024-09-08 ENCOUNTER — Emergency Department: Admission: EM | Admit: 2024-09-08 | Discharge: 2024-09-08 | Disposition: A | Discharge status: Home / Self Care

## 2024-09-08 DIAGNOSIS — I10 Essential (primary) hypertension: Secondary | ICD-10-CM | POA: Insufficient documentation

## 2024-09-08 DIAGNOSIS — N132 Hydronephrosis with renal and ureteral calculous obstruction: Secondary | ICD-10-CM | POA: Diagnosis not present

## 2024-09-08 DIAGNOSIS — N179 Acute kidney failure, unspecified: Secondary | ICD-10-CM | POA: Diagnosis not present

## 2024-09-08 DIAGNOSIS — N201 Calculus of ureter: Secondary | ICD-10-CM

## 2024-09-08 DIAGNOSIS — R10A1 Flank pain, right side: Secondary | ICD-10-CM | POA: Diagnosis present

## 2024-09-08 LAB — BASIC METABOLIC PANEL WITH GFR
Anion gap: 17 — ABNORMAL HIGH (ref 5–15)
BUN: 10 mg/dL (ref 6–20)
CO2: 20 mmol/L — ABNORMAL LOW (ref 22–32)
Calcium: 9.5 mg/dL (ref 8.9–10.3)
Chloride: 103 mmol/L (ref 98–111)
Creatinine, Ser: 1.31 mg/dL — ABNORMAL HIGH (ref 0.44–1.00)
GFR, Estimated: 49 mL/min — ABNORMAL LOW (ref >60)
Glucose, Bld: 140 mg/dL — ABNORMAL HIGH (ref 70–99)
Potassium: 3.9 mmol/L (ref 3.5–5.1)
Sodium: 140 mmol/L (ref 135–145)

## 2024-09-08 LAB — CBC
HCT: 42.4 % (ref 36.0–46.0)
Hemoglobin: 14.3 g/dL (ref 12.0–15.0)
MCH: 29.4 pg (ref 26.0–34.0)
MCHC: 33.7 g/dL (ref 30.0–36.0)
MCV: 87.1 fL (ref 80.0–100.0)
Platelets: 326 K/uL (ref 150–400)
RBC: 4.87 MIL/uL (ref 3.87–5.11)
RDW: 12.3 % (ref 11.5–15.5)
WBC: 11.4 K/uL — ABNORMAL HIGH (ref 4.0–10.5)
nRBC: 0 % (ref 0.0–0.2)

## 2024-09-08 LAB — URINALYSIS, ROUTINE W REFLEX MICROSCOPIC
Bilirubin Urine: NEGATIVE
Glucose, UA: NEGATIVE mg/dL
Ketones, ur: 20 mg/dL — AB
Nitrite: NEGATIVE
Protein, ur: 30 mg/dL — AB
RBC / HPF: >50 RBC/hpf (ref 0–5)
Specific Gravity, Urine: 1.029 (ref 1.005–1.030)
pH: 5 (ref 5.0–8.0)

## 2024-09-08 LAB — POC URINE PREG, ED: Preg Test, Ur: NEGATIVE

## 2024-09-08 MED ORDER — IBUPROFEN 200 MG PO TABS: 600.0000 mg | ORAL_TABLET | Freq: Three times a day (TID) | ORAL | 2 refills | Status: DC | PRN

## 2024-09-08 MED ORDER — MORPHINE SULFATE (PF) 2 MG/ML IV SOLN
2.0000 mg | Freq: Once | INTRAVENOUS | Status: AC
  Administered 2024-09-08: 2 mg via INTRAVENOUS
  Filled 2024-09-08: qty 1

## 2024-09-08 MED ORDER — ONDANSETRON 4 MG PO TBDP: 4.0000 mg | ORAL_TABLET | Freq: Three times a day (TID) | ORAL | 0 refills | Status: DC | PRN

## 2024-09-08 MED ORDER — ONDANSETRON HCL 4 MG/2ML IJ SOLN
4.0000 mg | Freq: Once | INTRAMUSCULAR | Status: AC
  Administered 2024-09-08: 4 mg via INTRAVENOUS
  Filled 2024-09-08: qty 2

## 2024-09-08 MED ORDER — SODIUM CHLORIDE 0.9 % IV BOLUS
1000.0000 mL | Freq: Once | INTRAVENOUS | Status: AC
Start: 2024-09-08 — End: 2024-09-08
  Administered 2024-09-08: 1000 mL via INTRAVENOUS

## 2024-09-08 MED ORDER — ACETAMINOPHEN 500 MG PO TABS: 1000.0000 mg | ORAL_TABLET | Freq: Four times a day (QID) | ORAL | 2 refills | Status: AC | PRN

## 2024-09-08 MED ORDER — KETOROLAC TROMETHAMINE 15 MG/ML IJ SOLN
15.0000 mg | Freq: Once | INTRAMUSCULAR | Status: AC
  Administered 2024-09-08: 15 mg via INTRAVENOUS
  Filled 2024-09-08: qty 1

## 2024-09-08 MED ORDER — OXYCODONE HCL 5 MG PO TABS: 5.0000 mg | ORAL_TABLET | Freq: Three times a day (TID) | ORAL | 0 refills | Status: AC | PRN

## 2024-09-08 NOTE — Discharge Instructions (Signed)
 Your evaluation in the emergency department was notable for a small right-sided kidney stone and mild strain in your right kidney.  Fortunately we saw no evidence of a urinary tract infection.  Your pain was controlled well here in the emergency department, and I prescribed several medications to manage her symptoms at home.  Please call your urologist clinic tomorrow to schedule a follow-up appointment and follow-up with your primary care provider as well.  Return to the emergency department with any new or worsening symptoms.  We are avoiding Flomax  at this time because it caused palpitations before, but I encourage you to drink plenty of water daily.

## 2024-09-08 NOTE — ED Triage Notes (Signed)
 Pt ambulatory to triage.  Pt has right flank pain.  Vomited x 6   no hx kidney stones.  Pt has urinary frequency.  Pt alert  speech clear.

## 2024-09-08 NOTE — Telephone Encounter (Signed)
 FYI Only or Action Required?: Action required by provider: clinical question for provider, update on patient condition, and patient requesting antibiotic for possible UTI--only symptom is urgency after holding her urine during a 12 hour shift at work.  Patient was last seen in primary care on 07/06/2024 by Melvin Pao, NP.  Called Nurse Triage reporting Urinary Urgency.  Symptoms began yesterday.  Interventions attempted: Nothing.  Symptoms are: gradually worsening.  Triage Disposition: See PCP Within 2 Weeks  Patient/caregiver understands and will follow disposition?: No, wishes to speak with PCP             Copied from CRM [id number]. Topic: Clinical - Red Word Triage >> Sep 08, 2024  2:19 PM Amy B wrote: Red Word that prompted transfer to Nurse Triage: Urinary frequency, UTI symptoms. Reason for Disposition . 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 She states that she will come see her PCP if required but her schedule is full with work up to Thanksgiving Day & she states she would do a telephone/virtual visit with her PCP if needed Patient also states she may call a virtual teledoc to see if they would prescribe her something She states she is drinking plenty of water now and her only symptoms is urgency.  Patient is advised to call us  back if anything changes or with any further questions/concerns. Patient is advised that if anything worsens to go to the Emergency Room. Patient verbalized understanding.    1. SYMPTOM: What's the main symptom you're concerned about? (e.g., frequency, incontinence)     Urgency and  feeling like she isnt completely emptying 2. ONSET: When did the  urinary symptoms  start?     yesterday 3. PAIN: Is there any pain? If Yes, ask: How bad is it? (Scale: 1-10; mild, moderate, severe)     denies 4. CAUSE: What do you think is causing the symptoms?     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 OTHER SYMPTOMS: Do you have any other symptoms? (e.g., blood in urine, fever, flank pain, pain with urination)     denies  Protocols used: Urinary Symptoms-A-AH

## 2024-09-08 NOTE — Telephone Encounter (Signed)
 Patient will have to have an appointment in order for medication to be sent in. Patient can do a virtual appointment as long as she can come do a urine sample before the appointment. Please call to schedule.

## 2024-09-08 NOTE — ED Provider Notes (Signed)
 [facility] Provider Note    Event Date/Time   First MD Initiated Contact with Patient 09/08/24 2006     (approximate)   History   Flank Pain  Pt ambulatory to triage.  Pt has right flank pain.  Vomited x 6   no hx kidney stones.  Pt has urinary frequency.  Pt alert  speech clear.    HPI Victoria Shepard is a 53 y.o. female PMH hypertension, hyperlipidemia, anxiety, constipation presents for evaluation of right flank pain - Mild discomfort recently, severe pain today.  Vomited multiple times.  Notes urinary frequency. - No abdominal surgical history - Had otherwise been in her usual state of health.  No preceding trauma.  No fevers.       Physical Exam   Triage Vital Signs: ED Triage Vitals  Encounter Vitals Group     BP 09/08/24 1900 (!) 142/88     Girls Systolic BP Percentile --      Girls Diastolic BP Percentile --      Boys Systolic BP Percentile --      Boys Diastolic BP Percentile --      Pulse Rate 09/08/24 1900 (!) 110     Resp 09/08/24 1900 20     Temp 09/08/24 1900 98.3 F (36.8 C)     Temp Source 09/08/24 1900 Oral     SpO2 09/08/24 1900 100 %     Weight 09/08/24 1857 183 lb (83 kg)     Height 09/08/24 1857 5' 3 (1.6 m)     Head Circumference --      Peak Flow --      Pain Score 09/08/24 1857 10     Pain Loc --      Pain Education --      Exclude from Growth Chart --     Most recent vital signs: Vitals:   09/08/24 1900  BP: (!) 142/88  Pulse: (!) 110  Resp: 20  Temp: 98.3 F (36.8 C)  SpO2: 100%     General: Awake, restless, appears very uncomfortable CV:  Good peripheral perfusion. RRR, RP 2+ Resp:  Normal effort. CTAB Abd:  No distention. + Tender to palpation in right lower quadrant and right flank.  No tenderness elsewhere throughout abdomen.   ED Results / Procedures / Treatments   Labs (all labs ordered are listed, but only abnormal results are displayed) Labs Reviewed  URINALYSIS, ROUTINE W REFLEX  MICROSCOPIC - Abnormal; Notable for the following components:      Result Value   Color, Urine YELLOW (*)    APPearance TURBID (*)    Hgb urine dipstick MODERATE (*)    Ketones, ur 20 (*)    Protein, ur 30 (*)    Leukocytes,Ua SMALL (*)    Bacteria, UA RARE (*)    All other components within normal limits  CBC - Abnormal; Notable for the following components:   WBC 11.4 (*)    All other components within normal limits  BASIC METABOLIC PANEL WITH GFR - Abnormal; Notable for the following components:   CO2 20 (*)    Glucose, Bld 140 (*)    Creatinine, Ser 1.31 (*)    GFR, Estimated 49 (*)    Anion gap 17 (*)    All other components within normal limits  POC URINE PREG, ED     EKG  N/a   RADIOLOGY Radiology interpreted by myself and radiology report reviewed.  Notable for 3 mm right  urolithiasis with some hydronephrosis present.    PROCEDURES:  Critical Care performed: No  Procedures   MEDICATIONS ORDERED IN ED: Medications  ketorolac  (TORADOL ) 15 MG/ML injection 15 mg (15 mg Intravenous Given 09/08/24 2035)  sodium chloride  0.9 % bolus 1,000 mL (0 mLs Intravenous Stopped 09/08/24 2219)  ondansetron  (ZOFRAN ) injection 4 mg (4 mg Intravenous Given 09/08/24 2034)  morphine  (PF) 2 MG/ML injection 2 mg (2 mg Intravenous Given 09/08/24 2036)     IMPRESSION / MDM / ASSESSMENT AND PLAN / ED COURSE  I reviewed the triage vital signs and the nursing notes.                              DDX/MDM/AP: Differential diagnosis includes, but is not limited to, urolithiasis, consider UTI/pyelonephritis, less likely appendicitis, doubt ovarian pathology, doubt muscle strain.  Plan: - Labs and CT abdomen pelvis ordered from triage - Pain control, antiemetic, IV fluid  Patient's presentation is most consistent with acute presentation with potential threat to life or bodily function.  The patient is on the cardiac monitor to evaluate for evidence of arrhythmia and/or significant  heart rate changes.  ED course below.  CT with 3 mm distal right ureter stone with moderate hydronephrosis.  Labs with AKI, fortunately no significant evidence of underlying UTI.  Pain very well-controlled with Toradol  and morphine  here in emergency department, no further vomiting after Zofran .  Patient is already established with a urologist, will follow-up with them, also plan for PMD follow-up.  Will provide urine strainer.  Has not tolerated tamsulosin  well in the past, will defer.  Counseled on oral hydration.  ED return precautions in place.  Patient agrees with plan.  Rx Tylenol , Motrin , Zofran , oxycodone  for breakthrough pain.  Clinical Course as of 09/08/24 2329  Tue Sep 08, 2024  2006 CT: IMPRESSION: 1. 3 mm stone in the distal right ureter at the ureterovesical junction with moderate upstream hydronephrosis and hydroureter, with periureteral and perirenal stranding consistent with obstructing ureterolithiasis.   [MM]  2006 Urinalysis with notable hematuria though no significant findings to suggest UTI  CBC with very mild leukocytosis [MM]  2006 BMP with AKI, otherwise unremarkable [MM]  2216 Patient reevaluated, pain is entirely resolved, feeling much better.  Amenable to discharge home.  Already has a urologist, will follow-up with them.  Will provide urine strainer.  Tells me she has not responded well to Flomax  in the past, will defer.  Counseled on oral hydration.  Rx Tylenol , Motrin , Zofran , agrees with plan. [MM]    Clinical Course User Index [MM] Clarine Ozell LABOR, MD     FINAL CLINICAL IMPRESSION(S) / ED DIAGNOSES   Final diagnoses:  Calculus of ureter  AKI (acute kidney injury)     Rx / DC Orders   ED Discharge Orders          Ordered    acetaminophen  (TYLENOL ) 500 MG tablet  Every 6 hours PRN        09/08/24 2220    ibuprofen  (MOTRIN  IB) 200 MG tablet  Every 8 hours PRN        09/08/24 2220    ondansetron  (ZOFRAN -ODT) 4 MG disintegrating tablet  Every 8  hours PRN        09/08/24 2220    oxyCODONE  (ROXICODONE ) 5 MG immediate release tablet  Every 8 hours PRN        09/08/24 2220  Note:  This document was prepared using Dragon voice recognition software and may include unintentional dictation errors.   Clarine Ozell LABOR, MD 09/08/24 2329

## 2024-09-09 ENCOUNTER — Encounter: Payer: Self-pay | Admitting: Nurse Practitioner

## 2024-09-09 NOTE — Telephone Encounter (Signed)
 Patient went to the ER

## 2024-09-12 ENCOUNTER — Other Ambulatory Visit: Payer: Self-pay | Admitting: Urology

## 2024-09-12 DIAGNOSIS — N201 Calculus of ureter: Secondary | ICD-10-CM

## 2024-09-12 NOTE — Progress Notes (Signed)
 09/14/2024 10:09 PM   Victoria Shepard Nov 21, 1970 [id number]  Referring provider: Melvin Pao, NP [address] [place],  [place] [address]  Urological history: 1. High risk hematuria - non smoker - CT renal stone studies (12/2023) and (08/2024) - Nephrolithiasis - cysto (01/2024) NED  2. Nephrolithiasis - CT renal stone study (08/2024) - 3 mm right  UVJ stone  Chief Complaint  Patient presents with   Nephrolithiasis   HPI: Victoria Shepard is a 53 y.o. woman who presents today for ED follow up.  Previous records reviewed.  She presented to the ED on 09/08/2024 for right flank pain associated with vomiting and frequency.  CT renal stone study showed a 3 mm distal right ureteral stone.   UA w/  micro heme.   Serum creatinine 1.31.    Today, she is having some urinary frequency and uncomfortable feeling when voiding.  She did not see a distinct fragment pass.  Patient denies any modifying or aggravating factors.  Patient denies any recent UTI's, gross hematuria, dysuria or suprapubic/flank pain.  Patient denies any fevers, chills, nausea or vomiting.    UA yellow slightly cloudy, specific gravity 1.020, pH 6.5, 0-5 WBC, 0-2 RBC, 0-10 epithelial cells, mucus threads present and a few bacteria.   KUB the 3 mm right UVJ stone is not visualized on today's KUB  PMH: Past Medical History:  Diagnosis Date   Anxiety 2009   Depression 2009   Heart murmur    Herpes    History of abnormal mammogram 10/15/2008   History of cellulitis    belly button   Hypercholesteremia    Hypertension    Nexplanon  in place    placed 10/11/14, remove 10/11/17   OCD (obsessive compulsive disorder)     Surgical History: Past Surgical History:  Procedure Laterality Date   BIOPSY BREAST  04/2010   was normal   BREAST EXCISIONAL BIOPSY Left 2011   benign   BREAST SURGERY Left 2011   COLONOSCOPY  2010   COSMETIC SURGERY  2012   HERNIA REPAIR  2012   LIPOSUCTION  2012   MM DUCTOGRAM  BILAT R/S  2010   due to bloody breast discharge    Home Medications:  Allergies as of 09/14/2024       Reactions   Amoxicillin Anaphylaxis   Penicillins Anaphylaxis   Oseltamivir Hives   Bactrim  [sulfamethoxazole -trimethoprim ] Itching, Swelling   Erythromycin Other (See Comments)   GI Upset   Keflex  [cephalexin ] Other (See Comments)   Yeast infection   Tamsulosin  Palpitations        Medication List        Accurate as of September 14, 2024 11:59 PM. If you have any questions, ask your nurse or doctor.          acetaminophen  500 MG tablet Commonly known as: TYLENOL  Take 2 tablets (1,000 mg total) by mouth every 6 (six) hours as needed.   ibuprofen  600 MG tablet Commonly known as: ADVIL  Take 1 tablet (600 mg total) by mouth every 8 (eight) hours as needed.   ibuprofen  200 MG tablet Commonly known as: Motrin  IB Take 3 tablets (600 mg total) by mouth every 8 (eight) hours as needed.   linaclotide  290 MCG Caps capsule Commonly known as: Linzess  Take 1 capsule (290 mcg total) by mouth daily before breakfast.   lisinopril  20 MG tablet Commonly known as: ZESTRIL  Take 1 tablet (20 mg total) by mouth daily.   Nexplanon  68 MG Impl  implant Generic drug: etonogestrel  1 each by Subdermal route once.   ondansetron  8 MG disintegrating tablet Commonly known as: ZOFRAN -ODT Take 1 tablet (8 mg total) by mouth every 8 (eight) hours as needed for nausea or vomiting.   ondansetron  4 MG disintegrating tablet Commonly known as: ZOFRAN -ODT Take 1 tablet (4 mg total) by mouth every 8 (eight) hours as needed for nausea or vomiting.   oxyCODONE  5 MG immediate release tablet Commonly known as: Roxicodone  Take 1 tablet (5 mg total) by mouth every 8 (eight) hours as needed for breakthrough pain.   rosuvastatin  20 MG tablet Commonly known as: CRESTOR  Take 1 tablet (20 mg total) by mouth daily.   tirzepatide  7.5 MG/0.5ML injection vial Inject 7.5 mg into the skin once a week.    triamcinolone  cream 0.1 % Commonly known as: KENALOG  Apply 1 Application topically 2 (two) times daily.        Allergies:  Allergies  Allergen Reactions   Amoxicillin Anaphylaxis   Penicillins Anaphylaxis   Oseltamivir Hives   Bactrim  [Sulfamethoxazole -Trimethoprim ] Itching and Swelling   Erythromycin Other (See Comments)    GI Upset   Keflex  [Cephalexin ] Other (See Comments)    Yeast infection   Tamsulosin  Palpitations    Family History: Family History  Problem Relation Age of Onset   Hypertension Mother    Hyperlipidemia Mother    Diabetes Mother    Stroke Mother    COPD Mother    Vision loss Mother    Varicose Veins Mother    Heart disease Father        CABG   Hypertension Father    Obesity Father    Breast cancer Sister    Alcohol abuse Sister    Depression Sister    Anxiety disorder Sister    Early death Sister    Obesity Sister    Breast cancer Maternal Aunt        twice 30s and 78s   Skin cancer Paternal Aunt        73s   Pancreatic cancer Paternal Uncle        3s   Bone cancer Paternal Uncle        3s   Cancer Maternal Grandmother        lung   Kidney disease Maternal Grandmother    Arthritis Maternal Grandfather    Stroke Paternal Grandmother    Stroke Paternal Grandfather    Mental illness Brother        anxiety   Anxiety disorder Brother    ADD / ADHD Daughter    Obesity Daughter    Asthma Son    Hearing loss Son     Social History:  reports that she has never smoked. She has never used smokeless tobacco. She reports that she does not drink alcohol and does not use drugs.  ROS: Pertinent ROS in HPI  Physical Exam: BP 122/83   Pulse 86   Ht 5' 3 (1.6 m)   Wt 182 lb (82.6 kg)   LMP  (LMP Unknown)   BMI 32.24 kg/m   Constitutional:  Well nourished. Alert and oriented, No acute distress. HEENT: [place] AT, moist mucus membranes.  Trachea midline Cardiovascular: No clubbing, cyanosis, or edema. Respiratory: Normal respiratory  effort, no increased work of breathing. Neurologic: Grossly intact, no focal deficits, moving all 4 extremities. Psychiatric: Normal mood and affect.    Laboratory Data: See Epic and HPI   I have reviewed the labs.   Pertinent Imaging: EXAM:  CT ABDOMEN AND PELVIS WITHOUT CONTRAST 09/08/2024 07:41:49 PM   TECHNIQUE: CT of the abdomen and pelvis was performed without the administration of intravenous contrast. Multiplanar reformatted images are provided for review. Automated exposure control, iterative reconstruction, and/or weight-based adjustment of the mA/kV was utilized to reduce the radiation dose to as low as reasonably achievable.   COMPARISON: Comparison with 12/31/2023.   CLINICAL HISTORY: Abdominal/flank pain, stone suspected.   FINDINGS:   LOWER CHEST: Lung bases are clear.   LIVER: The liver is unremarkable.   GALLBLADDER AND BILE DUCTS: Gallbladder is unremarkable. No biliary ductal dilatation.   SPLEEN: No acute abnormality.   PANCREAS: No acute abnormality.   ADRENAL GLANDS: No acute abnormality.   KIDNEYS, URETERS AND BLADDER: 3 mm stone in the distal right ureter at the ureterovesical junction with moderate proximal hydronephrosis and hydroureter, and with stranding around the right kidney and ureter. The left kidney and ureter are normal. No other stones are demonstrated. The bladder is decompressed.   GI AND BOWEL: Stomach demonstrates no acute abnormality. The appendix is normal. There is no bowel obstruction.   PERITONEUM AND RETROPERITONEUM: No ascites. No free air.   VASCULATURE: Aorta is normal in caliber.   LYMPH NODES: No lymphadenopathy.   REPRODUCTIVE ORGANS: No acute abnormality.   BONES AND SOFT TISSUES: No acute osseous abnormality. No focal soft tissue abnormality.   IMPRESSION: 1. 3 mm stone in the distal right ureter at the ureterovesical junction with moderate upstream hydronephrosis and hydroureter, with  periureteral and perirenal stranding consistent with obstructing ureterolithiasis.   Electronically signed by: Elsie Gravely MD 09/08/2024 07:52 PM EST RP Workstation: [id number]   KUB, radiologist interpretation still pending  I have independently reviewed the films.  See HPI.    Assessment & Plan:    1. Right UVJ stone - UA w/o micro heme - stone not visualized on today's KUB, suggesting interval passage or radiolucency not captured on plain film. - no current symptoms consistent with renal colic - Reviewed return precautions for recurrent flank pain, fever, hematuria, or worsening urinary symptoms. - Encourage hydration to support clearance of any residual debris. - If frequency persists or worsens, consider evaluation for overactive bladder, pelvic floor dysfunction, or repeat imaging depending on symptom evolution - we did discuss a metabolic workup since her father is a multiple stone former and she would like to pursue - if urine culture returns negative, I will place the order for the metabolic workup  - if urine culture is positive, will treat with appropriate antibiotics and then place the order for the metabolic workup   2. Gross  hematuria - UA w/o micro heme - hematuria likely from the stone   3. Frequency - urinary frequency, likely bladder irritation following recent stone passage or nonspecific lower urinary tract symptoms. - UA clear; no evidence of UTI, urine culture sent in an abundance of caution  Return for Follow up pending labs.  These notes generated with voice recognition software. I apologize for typographical errors.  CLOTILDA HELON RIGGERS  [facility] [address]  [address] [place], [place] [address] [phone]

## 2024-09-14 ENCOUNTER — Ambulatory Visit: Admission: RE | Admit: 2024-09-14 | Discharge: 2024-09-14 | Disposition: A | Attending: Urology | Admitting: Urology

## 2024-09-14 ENCOUNTER — Ambulatory Visit: Admitting: Urology

## 2024-09-14 ENCOUNTER — Ambulatory Visit
Admission: RE | Admit: 2024-09-14 | Discharge: 2024-09-14 | Disposition: A | Source: Ambulatory Visit | Attending: Urology

## 2024-09-14 VITALS — BP 122/83 | HR 86 | Ht 63.0 in | Wt 182.0 lb

## 2024-09-14 DIAGNOSIS — N201 Calculus of ureter: Secondary | ICD-10-CM

## 2024-09-14 DIAGNOSIS — R310 Gross hematuria: Secondary | ICD-10-CM | POA: Diagnosis not present

## 2024-09-15 LAB — MICROSCOPIC EXAMINATION

## 2024-09-15 LAB — URINALYSIS, COMPLETE
Bilirubin, UA: NEGATIVE
Glucose, UA: NEGATIVE
Ketones, UA: NEGATIVE
Leukocytes,UA: NEGATIVE
Nitrite, UA: NEGATIVE
Protein,UA: NEGATIVE
RBC, UA: NEGATIVE
Specific Gravity, UA: 1.02 (ref 1.005–1.030)
Urobilinogen, Ur: 1 mg/dL (ref 0.2–1.0)
pH, UA: 6.5 (ref 5.0–7.5)

## 2024-09-16 ENCOUNTER — Encounter: Payer: Self-pay | Admitting: Nurse Practitioner

## 2024-09-16 ENCOUNTER — Ambulatory Visit (INDEPENDENT_AMBULATORY_CARE_PROVIDER_SITE_OTHER): Admitting: Nurse Practitioner

## 2024-09-16 VITALS — BP 123/79 | HR 92 | Temp 98.1°F | Ht 63.0 in | Wt 180.8 lb

## 2024-09-16 DIAGNOSIS — N200 Calculus of kidney: Secondary | ICD-10-CM | POA: Diagnosis not present

## 2024-09-16 MED ORDER — TIRZEPATIDE-WEIGHT MANAGEMENT 10 MG/0.5ML ~~LOC~~ SOLN: 10.0000 mg | SUBCUTANEOUS | 2 refills | Status: DC

## 2024-09-16 NOTE — Assessment & Plan Note (Signed)
 Chronic. Ongoing.  Will increase Zepbound  to 10mg  weekly.  Not having any side effects.  Follow up in 3 months.  Call sooner if concerns arise.

## 2024-09-16 NOTE — Progress Notes (Signed)
 BP 123/79   Pulse 92   Temp 98.1 F (36.7 C) (Oral)   Ht 5' 3 (1.6 m)   Wt 180 lb 12.8 oz (82 kg)   LMP  (LMP Unknown)   SpO2 98%   BMI 32.03 kg/m    Subjective:    Patient ID: Victoria Shepard, female    DOB: [birth year], 53 y.o.   MRN: [id number]  HPI: Victoria Shepard is a 53 y.o. female  Chief Complaint  Patient presents with   ER Follow Up   Patient presents to clinic to follow up after ED visit on 09/08/24 for a kidney stone.  Patient followed up with Urology Yesterday.  Today she states she did passed the stone.  Pain is resolving.  Nausea and vomiting has resolved. She is having urinary urgency and frequency.   WEIGHT GAIN Duration: Likes the zepbound .  Has been successful with GLP1 therapy with almost 50lbs of weight loss.  States she is not having any side effects.  Feels much better on the medication.     Relevant past medical, surgical, family and social history reviewed and updated as indicated. Interim medical history since our last visit reviewed. Allergies and medications reviewed and updated.  Review of Systems  Gastrointestinal:  Positive for nausea and vomiting.  Genitourinary:  Positive for frequency and urgency.  Musculoskeletal:  Positive for back pain.    Per HPI unless specifically indicated above     Objective:    BP 123/79   Pulse 92   Temp 98.1 F (36.7 C) (Oral)   Ht 5' 3 (1.6 m)   Wt 180 lb 12.8 oz (82 kg)   LMP  (LMP Unknown)   SpO2 98%   BMI 32.03 kg/m   Wt Readings from Last 3 Encounters:  09/16/24 180 lb 12.8 oz (82 kg)  09/14/24 182 lb (82.6 kg)  09/08/24 183 lb (83 kg)    Physical Exam Vitals and nursing note reviewed.  Constitutional:      General: She is not in acute distress.    Appearance: Normal appearance. She is normal weight. She is not ill-appearing, toxic-appearing or diaphoretic.  HENT:     Head: Normocephalic.     Right Ear: External ear normal.     Left Ear: External ear normal.     Nose: Nose normal.      Mouth/Throat:     Mouth: Mucous membranes are moist.     Pharynx: Oropharynx is clear.  Eyes:     General:        Right eye: No discharge.        Left eye: No discharge.     Extraocular Movements: Extraocular movements intact.     Conjunctiva/sclera: Conjunctivae normal.     Pupils: Pupils are equal, round, and reactive to light.  Cardiovascular:     Rate and Rhythm: Normal rate and regular rhythm.     Heart sounds: No murmur heard. Pulmonary:     Effort: Pulmonary effort is normal. No respiratory distress.     Breath sounds: Normal breath sounds. No wheezing or rales.  Abdominal:     General: Abdomen is flat. Bowel sounds are normal. There is no distension.     Palpations: Abdomen is soft. There is no mass.     Tenderness: There is no abdominal tenderness. There is no right CVA tenderness, left CVA tenderness, guarding or rebound.     Hernia: No hernia is present.  Musculoskeletal:     Cervical  back: Normal range of motion and neck supple.  Skin:    General: Skin is warm and dry.     Capillary Refill: Capillary refill takes less than 2 seconds.  Neurological:     General: No focal deficit present.     Mental Status: [name] and oriented to person, place, and time. Mental status is at baseline.  Psychiatric:        Mood and Affect: Mood normal.        Behavior: Behavior normal.        Thought Content: Thought content normal.        Judgment: Judgment normal.     Results for orders placed or performed in visit on 09/14/24  Microscopic Examination   Collection Time: 09/14/24  2:55 PM   Urine  Result Value Ref Range   WBC, UA 0-5 0 - 5 /hpf   RBC, Urine 0-2 0 - 2 /hpf   Epithelial Cells (non renal) 0-10 0 - 10 /hpf   Mucus, UA Present (A) Not Estab.   Bacteria, UA Few (A) None seen/Few  Urinalysis, Complete   Collection Time: 09/14/24  2:55 PM  Result Value Ref Range   Specific Gravity, UA 1.020 1.005 - 1.030   pH, UA 6.5 5.0 - 7.5   Color, UA Yellow Yellow    Appearance Ur Slightly cloudy Clear   Leukocytes,UA Negative Negative   Protein,UA Negative Negative/Trace   Glucose, UA Negative Negative   Ketones, UA Negative Negative   RBC, UA Negative Negative   Bilirubin, UA Negative Negative   Urobilinogen, Ur 1.0 0.2 - 1.0 mg/dL   Nitrite, UA Negative Negative   Microscopic Examination See below:       Assessment & Plan:   Problem List Items Addressed This Visit       Other   Morbid obesity (HCC)   Chronic. Ongoing.  Will increase Zepbound  to 10mg  weekly.  Not having any side effects.  Follow up in 3 months.  Call sooner if concerns arise.      Relevant Medications   tirzepatide  10 MG/0.5ML injection vial   Other Visit Diagnoses       Kidney stone    -  Primary   Saw Urology yesterday and repeat imaging showed that she has passed the stone.  States the pain is resolving.  Encouraged increased water intake.        Follow up plan: No follow-ups on file.

## 2024-09-18 ENCOUNTER — Telehealth: Payer: Self-pay | Admitting: Urology

## 2024-09-18 ENCOUNTER — Encounter: Payer: Self-pay | Admitting: Urology

## 2024-09-18 NOTE — Telephone Encounter (Signed)
 We ordered an urine culture on her when we saw her in clinic on the 1st, but it still saying needs collection.  Can you make sure it was sent and if not, we will need to ask Victoria Shepard if she would come give us  another urine sample.

## 2024-09-21 ENCOUNTER — Telehealth: Payer: Self-pay

## 2024-09-21 NOTE — Telephone Encounter (Signed)
 Spoke with patient in regards to coming back in for a urine culture, patient states that she is not feeling well today after her medication injection. Patient states that she is not having any urinary symptoms since her visit. Patient states that she saw her PCP and feels that urinary symptoms were due to irritation from the kidney stone. Patient states that she has a follow-up visit in January with her Pcp. Provider advised   Andrea Kirks LPN

## 2024-10-05 ENCOUNTER — Ambulatory Visit: Admitting: Nurse Practitioner

## 2024-10-08 ENCOUNTER — Other Ambulatory Visit: Payer: Self-pay | Admitting: Nurse Practitioner

## 2024-10-09 NOTE — Telephone Encounter (Signed)
 Requested Prescriptions  Pending Prescriptions Disp Refills   ibuprofen  (ADVIL ) 600 MG tablet [Pharmacy Med Name: IBUPROFEN  600MG  TABLETS] 60 tablet 0    Sig: TAKE 1 TABLET(600 MG) BY MOUTH EVERY 8 HOURS AS NEEDED     Analgesics:  NSAIDS Failed - 10/09/2024  6:19 PM      Failed - Manual Review: Labs are only required if the patient has taken medication for more than 8 weeks.      Failed - Cr in normal range and within 360 days    Creatinine  Date Value Ref Range Status  06/18/2014 1.16 0.60 - 1.30 mg/dL Final   Creatinine, Ser  Date Value Ref Range Status  09/08/2024 1.31 (H) 0.44 - 1.00 mg/dL Final         Passed - HGB in normal range and within 360 days    Hemoglobin  Date Value Ref Range Status  09/08/2024 14.3 12.0 - 15.0 g/dL Final  96/94/7974 85.9 11.1 - 15.9 g/dL Final   Total hemoglobin  Date Value Ref Range Status  11/08/2023 15.1 12.0 - 16.0 g/dL Final         Passed - PLT in normal range and within 360 days    Platelets  Date Value Ref Range Status  09/08/2024 326 150 - 400 K/uL Final  12/18/2023 285 150 - 450 x10E3/uL Final         Passed - HCT in normal range and within 360 days    HCT  Date Value Ref Range Status  09/08/2024 42.4 36.0 - 46.0 % Final   Hematocrit  Date Value Ref Range Status  12/18/2023 42.6 34.0 - 46.6 % Final         Passed - eGFR is 30 or above and within 360 days    EGFR (African American)  Date Value Ref Range Status  06/18/2014 >60  Final   GFR calc Af Amer  Date Value Ref Range Status  12/18/2019 100 >59 mL/min/1.73 Final   EGFR (Non-African Amer.)  Date Value Ref Range Status  06/18/2014 58 (L)  Final    Comment:    eGFR values <39mL/min/1.73 m2 may be an indication of chronic kidney disease (CKD). Calculated eGFR is useful in patients with stable renal function. The eGFR calculation will not be reliable in acutely ill patients when serum creatinine is changing rapidly. It is not useful in  patients on dialysis.  The eGFR calculation may not be applicable to patients at the low and high extremes of body sizes, pregnant women, and vegetarians. POTASSIUM - Slight hemolysis, interpret results with  - caution.    GFR, Estimated  Date Value Ref Range Status  09/08/2024 49 (L) >60 mL/min Final    Comment:    (NOTE) Calculated using the CKD-EPI Creatinine Equation (2021)    eGFR  Date Value Ref Range Status  12/18/2023 76 >59 mL/min/1.73 Final         Passed - Patient is not pregnant      Passed - Valid encounter within last 12 months    Recent Outpatient Visits           3 weeks ago Kidney stone   [place] [facility] Melvin Pao, NP   3 months ago Eczema, unspecified type   [place] [facility] Melvin Pao, NP   4 months ago Anxiety   [place] [facility] [place], Pao, NP   5 months ago Other migraine with status migrainosus, intractable  [place] [facility] Melvin Pao, NP   9 months ago Chronic constipation   [place] [facility] Melvin Pao, NP

## 2024-10-12 ENCOUNTER — Other Ambulatory Visit: Payer: Self-pay | Admitting: Nurse Practitioner

## 2024-10-14 ENCOUNTER — Encounter: Payer: Self-pay | Admitting: Nurse Practitioner

## 2024-10-16 MED ORDER — ZEPBOUND 10 MG/0.5ML ~~LOC~~ SOAJ: 10.0000 mg | SUBCUTANEOUS | 2 refills | Status: AC

## 2024-10-19 ENCOUNTER — Ambulatory Visit: Admitting: Nurse Practitioner

## 2024-10-19 ENCOUNTER — Encounter: Payer: Self-pay | Admitting: Nurse Practitioner

## 2024-10-19 ENCOUNTER — Telehealth: Payer: Self-pay

## 2024-10-19 ENCOUNTER — Ambulatory Visit: Payer: Self-pay

## 2024-10-19 VITALS — BP 141/85 | HR 102 | Temp 99.6°F | Resp 17 | Ht 62.99 in | Wt 176.6 lb

## 2024-10-19 DIAGNOSIS — J029 Acute pharyngitis, unspecified: Secondary | ICD-10-CM | POA: Diagnosis not present

## 2024-10-19 LAB — POC COVID19 BINAXNOW: SARS Coronavirus 2 Ag: NEGATIVE

## 2024-10-19 MED ORDER — PREDNISONE 20 MG PO TABS: 40.0000 mg | ORAL_TABLET | Freq: Every day | ORAL | 0 refills | Status: AC

## 2024-10-19 MED ORDER — LIDOCAINE VISCOUS HCL 2 % MT SOLN: 15.0000 mL | OROMUCOSAL | 0 refills | Status: AC | PRN

## 2024-10-19 NOTE — Assessment & Plan Note (Signed)
 Acute for 3 days. Covid, Flu, and Strep testing all negative. We discussed viral illnesses and that these often run their course within 5-7 days, that abx therapy is not recommended during this time and can lead to adverse effects. Will send in Prednisone  40 MG daily for 5 days to help with inflammation and Viscous Lidocaine  to use as needed for sore throat. Recommend: - Increased rest - Increasing Fluids - Acetaminophen  / ibuprofen  as needed for fever/pain.  - Salt water gargling, chloraseptic spray and throat lozenges - Mucinex.  - Saline sinus flushes or a neti pot.  - Humidifying the air.  - If no improvement next week then alert provider and would consider abx therapy at that time.

## 2024-10-19 NOTE — Telephone Encounter (Signed)
 Copied from CRM [id number]. Topic: General - Other >> Oct 19, 2024 12:28 PM Rosaria E wrote: Reason for CRM: Pt needs a doctor's note to cover her for her visit today and be on be on bed rest for the next 7 days. High fever, has now retrieved her medicine.   Please upload via MyChart

## 2024-10-19 NOTE — Telephone Encounter (Signed)
 FYI Only or Action Required?: FYI only for provider: appointment scheduled on Today.  Patient was last seen in primary care on 09/16/2024 by Melvin Pao, NP.  Called Nurse Triage reporting Sore Throat. HA, BA, fever  Symptoms began several days ago.  Interventions attempted: OTC medications: IBU 600, Vicks.  Symptoms are: gradually worsening.  Triage Disposition: See Physician Within 24 Hours  Patient/caregiver understands and will follow disposition?: Yes                        Copied from CRM #[id number]. Topic: Clinical - Red Word Triage >> Oct 19, 2024  8:02 AM Olam RAMAN wrote: Red Word that prompted transfer to Nurse Triage: Pt has a 102.5 fever and very sore throat. Was sick since saturday and not sure if it is the flu or covid. Has cold chills, and coughing so much throat is feeling raw Reason for Disposition  SEVERE throat pain (e.g., excruciating)  Answer Assessment - Initial Assessment Questions 1. ONSET: When did the throat start hurting? (Hours or days ago)      Saturday 2. SEVERITY: How bad is the sore throat? (Scale 1-10; mild, moderate or severe)     10/10 3. STREP EXPOSURE: Has there been any exposure to strep within the past week? If Yes, ask: What type of contact occurred?      unsure 4.  VIRAL SYMPTOMS: Are there any symptoms of a cold, such as a runny nose, cough, hoarse voice or red eyes?      Cough, hoarse 5. FEVER: Do you have a fever? If Yes, ask: What is your temperature, how was it measured, and when did it start?     yes 6. PUS ON THE TONSILS: Is there pus on the tonsils in the back of your throat?     White on tonsils 7. OTHER SYMPTOMS: Do you have any other symptoms? (e.g., difficulty breathing, headache, rash)     HA, BA, fatigue  Protocols used: Sore Throat-A-AH

## 2024-10-19 NOTE — Progress Notes (Signed)
 "  BP (!) 141/85 (BP Location: Left Arm, Patient Position: Sitting, Cuff Size: Normal)   Pulse (!) 102   Temp 99.6 F (37.6 C) (Oral)   Resp 17   Ht 5' 2.99 (1.6 m)   Wt 176 lb 9.6 oz (80.1 kg)   SpO2 99%   BMI 31.29 kg/m    Subjective:    Patient ID: Mercy GORMAN Humbles, female    DOB: 1971-09-20, 54 y.o.   MRN: [id number]  HPI: CLOTHILDE TIPPETTS is a 54 y.o. female  Chief Complaint  Patient presents with   Sore Throat    Sore throat and been running a high fever. Highest has been 102.5. Temp is low in the mornings and high at night time. Has a terrible headache. Has white spots in mouth on tonsils   UPPER RESPIRATORY TRACT INFECTION Started to feel bad on Saturday evening, coughing and headache + body aches.  Fever: yes Cough: yes Shortness of breath: no Wheezing: no Chest pain: no Chest tightness: yes Chest congestion: yes Nasal congestion: yes Runny nose: yes Post nasal drip: yes Sneezing: no Sore throat: yes Swollen glands: no Sinus pressure: yes Headache: yes Face pain: yes Toothache: no Ear pain: none Ear pressure: itchy ears both side Eyes red/itching:no Eye drainage/crusting: no  Vomiting: no Rash: no Fatigue: yes Sick contacts: yes - husband has been sick Strep contacts: no  Context: fluctuating Recurrent sinusitis: no Relief with OTC cold/cough medications: no  Treatments attempted: Ibuprofen , Vicks  -- allergic to Tamiflu  Relevant past medical, surgical, family and social history reviewed and updated as indicated. Interim medical history since our last visit reviewed. Allergies and medications reviewed and updated.  Review of Systems  Constitutional:  Positive for chills, fatigue and fever. Negative for activity change, appetite change and diaphoresis.  HENT:  Positive for congestion, postnasal drip, rhinorrhea, sinus pressure and sore throat. Negative for ear discharge, ear pain, facial swelling, sinus pain, sneezing and voice change.    Respiratory:  Positive for cough and chest tightness. Negative for shortness of breath and wheezing.   Cardiovascular:  Negative for chest pain, palpitations and leg swelling.  Gastrointestinal: Negative.   Endocrine: Negative.   Musculoskeletal:  Positive for myalgias.  Neurological:  Positive for headaches. Negative for dizziness and numbness.  Psychiatric/Behavioral: Negative.      Per HPI unless specifically indicated above     Objective:    BP (!) 141/85 (BP Location: Left Arm, Patient Position: Sitting, Cuff Size: Normal)   Pulse (!) 102   Temp 99.6 F (37.6 C) (Oral)   Resp 17   Ht 5' 2.99 (1.6 m)   Wt 176 lb 9.6 oz (80.1 kg)   SpO2 99%   BMI 31.29 kg/m   Wt Readings from Last 3 Encounters:  10/19/24 176 lb 9.6 oz (80.1 kg)  09/16/24 180 lb 12.8 oz (82 kg)  09/14/24 182 lb (82.6 kg)    Physical Exam Vitals and nursing note reviewed.  Constitutional:      General: She is awake. She is not in acute distress.    Appearance: She is well-developed and well-groomed. She is obese. She is ill-appearing. She is not toxic-appearing.  HENT:     Head: Normocephalic.     Right Ear: Hearing, ear canal and external ear normal. A middle ear effusion is present. Tympanic membrane is not injected or perforated.     Left Ear: Hearing, ear canal and external ear normal. A middle ear effusion is present. Tympanic membrane  is not injected or perforated.     Nose: Rhinorrhea present. Rhinorrhea is clear.     Right Sinus: No maxillary sinus tenderness or frontal sinus tenderness.     Left Sinus: No maxillary sinus tenderness or frontal sinus tenderness.     Mouth/Throat:     Mouth: Mucous membranes are moist.     Pharynx: Oropharyngeal exudate (x 1 small white patch), posterior oropharyngeal erythema (mild) and postnasal drip present. No pharyngeal swelling.  Eyes:     General: Lids are normal.        Right eye: No discharge.        Left eye: No discharge.     Conjunctiva/sclera:  Conjunctivae normal.     Pupils: Pupils are equal, round, and reactive to light.  Neck:     Thyroid: No thyromegaly.     Vascular: No carotid bruit.  Cardiovascular:     Rate and Rhythm: Normal rate and regular rhythm.     Heart sounds: Normal heart sounds. No murmur heard.    No gallop.  Pulmonary:     Effort: Pulmonary effort is normal. No accessory muscle usage or respiratory distress.     Breath sounds: Normal breath sounds. No decreased breath sounds, wheezing or rales.  Abdominal:     General: Bowel sounds are normal.     Palpations: Abdomen is soft. There is no hepatomegaly or splenomegaly.  Musculoskeletal:     Cervical back: Normal range of motion and neck supple.     Right lower leg: No edema.     Left lower leg: No edema.  Lymphadenopathy:     Head:     Right side of head: Submandibular and tonsillar adenopathy present. No submental, preauricular or posterior auricular adenopathy.     Left side of head: Submandibular and tonsillar adenopathy present. No submental, preauricular or posterior auricular adenopathy.     Cervical: No cervical adenopathy.  Skin:    General: Skin is warm and dry.  Neurological:     Mental Status: [name] and oriented to person, place, and time.  Psychiatric:        Attention and Perception: Attention normal.        Mood and Affect: Mood normal.        Speech: Speech normal.        Behavior: Behavior normal. Behavior is cooperative.        Thought Content: Thought content normal.     Results for orders placed or performed in visit on 09/14/24  Microscopic Examination   Collection Time: 09/14/24  2:55 PM   Urine  Result Value Ref Range   WBC, UA 0-5 0 - 5 /hpf   RBC, Urine 0-2 0 - 2 /hpf   Epithelial Cells (non renal) 0-10 0 - 10 /hpf   Mucus, UA Present (A) Not Estab.   Bacteria, UA Few (A) None seen/Few  Urinalysis, Complete   Collection Time: 09/14/24  2:55 PM  Result Value Ref Range   Specific Gravity, UA 1.020 1.005 -  1.030   pH, UA 6.5 5.0 - 7.5   Color, UA Yellow Yellow   Appearance Ur Slightly cloudy Clear   Leukocytes,UA Negative Negative   Protein,UA Negative Negative/Trace   Glucose, UA Negative Negative   Ketones, UA Negative Negative   RBC, UA Negative Negative   Bilirubin, UA Negative Negative   Urobilinogen, Ur 1.0 0.2 - 1.0 mg/dL   Nitrite, UA Negative Negative   Microscopic Examination See below:  Assessment & Plan:   Problem List Items Addressed This Visit       Other   Sore throat - Primary   Acute for 3 days. Covid, Flu, and Strep testing all negative. We discussed viral illnesses and that these often run their course within 5-7 days, that abx therapy is not recommended during this time and can lead to adverse effects. Will send in Prednisone  40 MG daily for 5 days to help with inflammation and Viscous Lidocaine  to use as needed for sore throat. Recommend: - Increased rest - Increasing Fluids - Acetaminophen  / ibuprofen  as needed for fever/pain.  - Salt water gargling, chloraseptic spray and throat lozenges - Mucinex.  - Saline sinus flushes or a neti pot.  - Humidifying the air.  - If no improvement next week then alert provider and would consider abx therapy at that time.      Relevant Orders   Influenza A & B (STAT)   POC COVID-19   Rapid Strep Screen ([facility] ONLY)     Follow up plan: Return if symptoms worsen or fail to improve.      "

## 2024-10-19 NOTE — Patient Instructions (Signed)

## 2024-10-19 NOTE — Telephone Encounter (Signed)
 Routing to provider who saw the patient today

## 2024-10-20 ENCOUNTER — Telehealth: Payer: Self-pay

## 2024-10-20 ENCOUNTER — Ambulatory Visit: Admitting: Nurse Practitioner

## 2024-10-20 ENCOUNTER — Other Ambulatory Visit (HOSPITAL_COMMUNITY): Payer: Self-pay

## 2024-10-20 ENCOUNTER — Encounter: Payer: Self-pay | Admitting: Nurse Practitioner

## 2024-10-20 NOTE — Telephone Encounter (Signed)
 Pharmacy Patient Advocate Encounter   Received notification from Patient Advice Request messages that prior authorization for Zepbound  2.5MG /0.5ML Auto injectors is required/requested.   Insurance verification completed.   The patient is insured through [facility].   Per test claim, medication is not covered due to plan/benefit exclusion, PA not submitted at this time  See list below for preferred alternatives by the insurance.. If suggested medication is appropriate please send new RX.

## 2024-10-21 MED ORDER — DOXYCYCLINE HYCLATE 100 MG PO TABS: 100.0000 mg | ORAL_TABLET | Freq: Two times a day (BID) | ORAL | 0 refills | Status: AC

## 2024-10-21 NOTE — Addendum Note (Signed)
 Addended by: Sonnie Bias T on: 10/21/2024 12:50 PM   Modules accepted: Orders

## 2024-10-22 ENCOUNTER — Other Ambulatory Visit: Payer: Self-pay | Admitting: Nurse Practitioner

## 2024-10-22 ENCOUNTER — Ambulatory Visit: Payer: Self-pay | Admitting: Nurse Practitioner

## 2024-10-22 LAB — RAPID STREP SCREEN (MED CTR MEBANE ONLY): Strep Gp A Ag, IA W/Reflex: NEGATIVE

## 2024-10-22 LAB — VERITOR FLU A/B WAIVED
Influenza A: NEGATIVE
Influenza B: NEGATIVE

## 2024-10-22 LAB — CULTURE, GROUP A STREP

## 2024-10-22 NOTE — Progress Notes (Signed)
 Contacted via MyChart  Good morning Victoria Shepard, your culture came back negative for Strep A which is the big one we look for, but there were some other types so complete antibiotics as ordered.

## 2024-10-23 NOTE — Telephone Encounter (Signed)
 Requested Prescriptions  Pending Prescriptions Disp Refills   LINZESS  290 MCG CAPS capsule [Pharmacy Med Name: LINZESS  CAPSULES] 90 capsule 0    Sig: TAKE 1 CAPSULE(290 MCG) BY MOUTH DAILY BEFORE BREAKFAST     Gastroenterology: Irritable Bowel Syndrome Passed - 10/23/2024  1:40 PM      Passed - Valid encounter within last 12 months    Recent Outpatient Visits           4 days ago Sore throat   [place] [facility] [place], Melanie T, NP   1 month ago Kidney stone   [place] [facility] Melvin Pao, NP   3 months ago Eczema, unspecified type   [place] [facility] Melvin Pao, NP   4 months ago Anxiety   [place] [facility] Melvin Pao, NP   6 months ago Other migraine with status migrainosus, intractable    [facility] Melvin Pao, NP

## 2024-11-05 ENCOUNTER — Encounter: Payer: Self-pay | Admitting: Nurse Practitioner

## 2024-11-06 MED ORDER — ONDANSETRON 4 MG PO TBDP: 4.0000 mg | ORAL_TABLET | Freq: Three times a day (TID) | ORAL | 0 refills | Status: AC | PRN

## 2024-11-06 MED ORDER — ZEPBOUND 12.5 MG/0.5ML ~~LOC~~ SOLN: 12.5000 mg | SUBCUTANEOUS | 2 refills | Status: AC

## 2024-11-11 ENCOUNTER — Ambulatory Visit: Admitting: Nurse Practitioner

## 2024-11-24 ENCOUNTER — Ambulatory Visit: Admitting: Nurse Practitioner
